# Patient Record
Sex: Male | Born: 1935 | Race: Black or African American | Hispanic: No | Smoking: Former smoker
Health system: Southern US, Community
[De-identification: ages and names within clinical notes are randomized; demographics above are authoritative.]

## PROBLEM LIST (undated history)

## (undated) DIAGNOSIS — E119 Type 2 diabetes mellitus without complications: Secondary | ICD-10-CM

## (undated) DIAGNOSIS — C801 Malignant (primary) neoplasm, unspecified: Secondary | ICD-10-CM

## (undated) HISTORY — DX: Type 2 diabetes mellitus without complications: E11.9

## (undated) HISTORY — DX: Malignant (primary) neoplasm, unspecified: C80.1

## (undated) HISTORY — PX: COLON SURGERY: SHX602

---

## 2001-09-03 ENCOUNTER — Encounter: Admission: RE | Admit: 2001-09-03 | Discharge: 2001-09-03 | Payer: Self-pay | Admitting: Otolaryngology

## 2001-09-03 ENCOUNTER — Encounter: Payer: Self-pay | Admitting: Otolaryngology

## 2002-04-25 ENCOUNTER — Encounter: Admission: RE | Admit: 2002-04-25 | Discharge: 2002-04-25 | Payer: Self-pay | Admitting: Orthopedic Surgery

## 2002-04-25 ENCOUNTER — Encounter: Payer: Self-pay | Admitting: Orthopedic Surgery

## 2002-08-02 ENCOUNTER — Encounter (INDEPENDENT_AMBULATORY_CARE_PROVIDER_SITE_OTHER): Payer: Self-pay

## 2002-08-02 ENCOUNTER — Ambulatory Visit (HOSPITAL_COMMUNITY): Admission: RE | Admit: 2002-08-02 | Discharge: 2002-08-02 | Payer: Self-pay | Admitting: Gastroenterology

## 2002-08-09 ENCOUNTER — Ambulatory Visit (HOSPITAL_COMMUNITY): Admission: RE | Admit: 2002-08-09 | Discharge: 2002-08-09 | Payer: Self-pay | Admitting: Gastroenterology

## 2002-08-09 ENCOUNTER — Encounter (INDEPENDENT_AMBULATORY_CARE_PROVIDER_SITE_OTHER): Payer: Self-pay

## 2002-10-27 ENCOUNTER — Encounter (INDEPENDENT_AMBULATORY_CARE_PROVIDER_SITE_OTHER): Payer: Self-pay | Admitting: Specialist

## 2002-10-27 ENCOUNTER — Ambulatory Visit (HOSPITAL_COMMUNITY): Admission: RE | Admit: 2002-10-27 | Discharge: 2002-10-27 | Payer: Self-pay | Admitting: Gastroenterology

## 2002-12-07 ENCOUNTER — Encounter: Payer: Self-pay | Admitting: Gastroenterology

## 2002-12-07 ENCOUNTER — Encounter: Admission: RE | Admit: 2002-12-07 | Discharge: 2002-12-07 | Payer: Self-pay | Admitting: Gastroenterology

## 2003-02-10 ENCOUNTER — Encounter: Payer: Self-pay | Admitting: Surgery

## 2003-02-14 ENCOUNTER — Ambulatory Visit (HOSPITAL_COMMUNITY): Admission: RE | Admit: 2003-02-14 | Discharge: 2003-02-14 | Payer: Self-pay | Admitting: Gastroenterology

## 2003-02-15 ENCOUNTER — Inpatient Hospital Stay (HOSPITAL_COMMUNITY): Admission: RE | Admit: 2003-02-15 | Discharge: 2003-02-20 | Payer: Self-pay | Admitting: Surgery

## 2003-02-15 ENCOUNTER — Encounter (INDEPENDENT_AMBULATORY_CARE_PROVIDER_SITE_OTHER): Payer: Self-pay

## 2004-05-23 ENCOUNTER — Encounter (INDEPENDENT_AMBULATORY_CARE_PROVIDER_SITE_OTHER): Payer: Self-pay | Admitting: Specialist

## 2004-05-23 ENCOUNTER — Ambulatory Visit (HOSPITAL_COMMUNITY): Admission: RE | Admit: 2004-05-23 | Discharge: 2004-05-23 | Payer: Self-pay | Admitting: Gastroenterology

## 2007-05-25 ENCOUNTER — Encounter: Admission: RE | Admit: 2007-05-25 | Discharge: 2007-05-25 | Payer: Self-pay | Admitting: Internal Medicine

## 2008-06-06 ENCOUNTER — Encounter: Admission: RE | Admit: 2008-06-06 | Discharge: 2008-06-06 | Payer: Self-pay | Admitting: Orthopedic Surgery

## 2010-12-16 ENCOUNTER — Encounter: Payer: Self-pay | Admitting: Podiatry

## 2011-04-12 NOTE — Op Note (Signed)
NAME:  Keith Pittman, Keith Pittman                         ACCOUNT NO.:  [id number]   MEDICAL RECORD NO.:  [id number]                   PATIENT TYPE:  AMB   LOCATION:  ENDO                                 FACILITY:  [facility]   PHYSICIAN:  Danise Edge, M.D.                DATE OF BIRTH:  1932-08-12   DATE OF PROCEDURE:  05/23/2004  DATE OF DISCHARGE:                                 OPERATIVE REPORT   PROCEDURE:  Colonoscopy with polypectomy.   PROCEDURE INDICATION:  Keith Pittman is a 77 year old male, born 01/08/1932.  Approximately 1 year ago, he underwent a laparoscopically-  assisted right hemicolectomy to remove a malignant polyp from the right  colon.  Surveillance colonoscopy with polypectomy to prevent recurrent colon  cancer is scheduled today.   ENDOSCOPIST:  Charolett Bumpers, M.D.   PREMEDICATION:  1. Versed 5 mg.  2. Demerol 50 mg.   DESCRIPTION OF PROCEDURE:  After obtaining informed consent, Mr. Needle was  placed in the left lateral decubitus position.  I administered intravenous  Demerol and intravenous Versed to achieve conscious sedation for the  procedure.  The patient's blood pressure, oxygen saturation, and cardiac  rhythm were monitored throughout the procedure and documented in the medical  record.   Anal inspection and digital rectal exam were normal.  The Olympus adjustable  pediatric colonoscope was introduced into the rectum and advanced to the  ileo right colonic surgical anastomosis.  Colonic preparation for the exam  today was excellent.   RECTUM:  Normal.  SIGMOID COLON AND DESCENDING COLON:  Left colonic diverticulosis.  SPLENIC FLEXURE:  Normal.  TRANSVERSE COLON:  From the proximal transverse colon, a 1 mm sessile polyp  was removed with the cold biopsy forceps.  HEPATIC FLEXURE:  Normal.  ASCENDING COLON:  Normal.  ILEO RIGHT COLONIC SURGICAL ANASTOMOSIS:  Normal.   ASSESSMENT:  A diminutive polyp was removed from the proximal  transverse  colon.  Left colonic diverticulosis is present.  Otherwise, normal  proctocolonoscopy to the ileal right colonic surgical anastomosis.   RECOMMENDATIONS:  Repeat colonoscopy in 5 years.                                               Danise Edge, M.D.    MJ/MEDQ  D:  05/23/2004  T:  05/23/2004  Job:  [id number]   cc:   Thornton Park Daphine Deutscher, M.D.  [address], [address]  [place]  [place] [address]  Fax: [phone]   Thora Lance, M.D.  [address]  [place]  [place] [address]  Fax: [phone]

## 2011-04-12 NOTE — Discharge Summary (Signed)
   NAME:  Keith Pittman, Keith Pittman                         ACCOUNT NO.:  [id number]   MEDICAL RECORD NO.:  [id number]                   PATIENT TYPE:  AMB   LOCATION:  ENDO                                 FACILITY:  [facility]   PHYSICIAN:  Thornton Park. Daphine Deutscher, M.D.             DATE OF BIRTH:  Apr 21, 1932   DATE OF ADMISSION:  02/14/2003  DATE OF DISCHARGE:  02/14/2003                                 DISCHARGE SUMMARY   ADMISSION DIAGNOSIS:  History of adenocarcinoma of the ascending colon,  status post colonoscopy with tattooing of lesion.   PROCEDURE:  Laparoscopically assisted right hemicolectomy.   HOSPITAL COURSE:  The patient had a bowel prep and came into the hospital to  undergo the above mentioned operation.  He underwent a laparoscopically  assisted right hemicolectomy.  The tattooed lesion was seen by the  pathologist and no residual carcinoma was found.  In the meantime, the  patient did well.  He began passing bowel movements and was ready for  discharge to return to the office in approximately a week.   DIET:  Advance as tolerated.   DISCHARGE MEDICATIONS:  Vicodin to take for pain.   CONDITION ON DISCHARGE:  Good.   FINAL DIAGNOSES:  1. History of adenocarcinoma of the right colon.  2. Status post laparoscopically assisted right hemicolectomy.                                               Thornton Park Daphine Deutscher, M.D.    MBM/MEDQ  D:  03/14/2003  T:  03/14/2003  Job:  [id number]   cc:   Danise Edge, M.D.  [address]  [place]  [place] [address]  Fax: [phone]

## 2011-04-12 NOTE — Op Note (Signed)
NAME:  Keith Pittman, GRANDT                         ACCOUNT NO.:  [id number]   MEDICAL RECORD NO.:  [id number]                   PATIENT TYPE:  AMB   LOCATION:  ENDO                                 FACILITY:  [facility]   PHYSICIAN:  Thornton Park. Daphine Deutscher, M.D.             DATE OF BIRTH:  [birth year]   DATE OF PROCEDURE:  02/15/2003  DATE OF DISCHARGE:                                 OPERATIVE REPORT   CCS#:  [id number]   PREOPERATIVE DIAGNOSES:  Carcinoma and polyp from right colon.   POSTOPERATIVE DIAGNOSES:  Carcinoma and polyp from right colon.   PROCEDURE:  Laparoscopically assisted right hemicolectomy.   SURGEON:  Thornton Park. Daphine Deutscher, M.D.   ASSISTANT:  Adolph Pollack, M.D.   OPERATIVE TIME:  2 hours.   ANESTHESIA:  General endotracheal.   DESCRIPTION OF PROCEDURE:  Dorrell Mitcheltree was a 77 year old gentleman taken  to room 11 at [facility], given general anesthesia. The abdomen was prepped  with Betadine and draped sterilely. A transverse incision was made just  above the umbilicus through which I have inserted a Hasson cannula. The  abdomen was insufflated and a 5 mm was placed above and below the abdomen,  also in line in the right side. A 30 degree scope was used. The cecum and  appendix were mobilized using the harmonic scalpel. This was carried up  along the right gutter to the hepatic flexure where I mobilized most all of  the hepatic flexure and then came down and divided the transverse colon from  the omentum and mobilized that as well.   Next, we inserted the hand retractor and this kind of walled off the wound.  This was after I made a small transverse incision. I did not completely  divide the rectus abdominis muscle and this assisted in the retraction of  the structures. The colon could all but come out of this hole. I did have to  mobilize a little bit more of the hepatic flexure using the Bovie and the  harmonic scalpel. I then divided the bowel terminal ileum. I found  the  tattooed area was sort of in the lateral extraperitoneal area of the  proximal ascending colon. The bowel was divided in the transverse colon. The  mesentery was divided with Kelly clamps and the harmonic scalpel and the  mesentery was oversewn with 2-0 silk suture ligatures.   The bowel ends were then aligned and a functional end-to-end anastomosis was  created with the GIA. Prior to doing that, I did close the mesentery with  interrupted 2-0 silks. Good staple anastomosis was done and the common  defect was closed with application of the TA-55. No bleeding was noted and  was checked. A crotch suture was placed. The bowel was then returned to the  abdomen. We changed our gloves. We removed the wound retractor. We irrigated  the abdomen with saline.   The posterior sheath  was then closed with running #1 PDS. The anterior  sheath was closed with running #1 PDS. The wound was injected with 0.5%  Marcaine. The wound was irrigated and then closed with staples. The patient  tolerated the procedure well and was taken to the recovery room in  satisfactory condition.   FINAL DIAGNOSIS:  Prior malignant polyp removed from the right colon. Status  post right hemicolectomy lap assisted.                                               Thornton Park Daphine Deutscher, M.D.    MBM/MEDQ  D:  02/15/2003  T:  02/15/2003  Job:  [id number]   cc:   Thora Lance, M.D.  [address] [place]  [place] [address]  Fax: [phone]   Danise Edge, M.D.  [address]  [place]  [place] [address]  Fax: [phone]

## 2011-04-12 NOTE — Op Note (Signed)
NAME:  Keith Pittman, Keith Pittman                         ACCOUNT NO.:  [id number]   MEDICAL RECORD NO.:  [id number]                   PATIENT TYPE:  AMB   LOCATION:  ENDO                                 [facility]ACILITY:  [facility]   PHYSICIAN:  Danise Edge, M.D.                DATE OF BIRTH:  05/03/1932   DATE OF PROCEDURE:  10/27/2002  DATE OF DISCHARGE:                                 OPERATIVE REPORT   PROCEDURE:  .  Colonoscopy and polypectomy.   INDICATIONS FOR PROCEDURE:  Keith Pittman is a 77 year old male born  07-19-1932. Keith Pittman underwent his first screening colonoscopy at  [facility] August 02, 2002. A 5 mm mid rectal sessile polyp was  lifted by submucosal saline injection and removed with the electrocautery  snare. The rectal polyp returned moderately differentiated colonic  adenocarcinoma associated with a tubular adenoma. From the proximal  ascending colon, an 8 mm sessile polyp was lifted with submucosal saline  injection and removed with the  electrocautery snare in piecemeal fashion.  Pathology returned colonic type adenocarcinoma associated with a  hyperplastic polyp.   On August 09, 2002, a repeat colonoscopy was performed. Multiple biopsies  taken from the mid rectal polypectomy site returned no residual neoplastic  tissue. Multiple biopsies taken from the proximal ascending colon  polypectomy site returned nonneoplastic tissue.   Keith Pittman is reluctant to undergo surgery unless absolutely necessary. I  think the rectal polyp has been completely removed; my concern is the  proximal ascending colon polyp which was larger and could easily have been  only partially removed. Repeat colonoscopy is scheduled today to assess both  polypectomy sites.   ENDOSCOPIST:  Charolett Bumpers, M.D.   PREMEDICATION:  Versed 10 mg, Demerol 50 mg .   ENDOSCOPE:  Olympus adult colonoscope.   DESCRIPTION OF PROCEDURE:  After obtaining informed consent, Mr.  Pittman was  placed in the left lateral decubitus position. I administered intravenous  Demerol and intravenous Versed to achieve conscious sedation for the  procedure. The patient's blood pressure, oxygen saturation and cardiac  rhythm were monitored throughout the procedure and documented in the medical  record.   Anal inspection was normal. Digital rectal exam was normal. The Olympus  pediatric video colonoscope was introduced into the rectum and advanced to  the cecum. Colonic preparation for the exam today was excellent.   RECTUM:  Close examination of the rectum was completely normal. I can not  even identify where the mid rectal polypectomy site was and there does not  appear to be any residual neoplastic tissue.   SIGMOID COLON AND DESCENDING COLON:  Left colonic diverticulosis.   SPLENIC FLEXURE:  Normal.   TRANSVERSE COLON:  Normal.   HEPATIC FLEXURE:  Normal.   ASCENDING COLON:  The proximal ascending colon polypectomy site was  identified. There is a residual 2 mm sessile polyp which was removed  with  the cold snare. The polypectomy site was lifted with submucosal saline  injection and two snare biopsies were obtained. Multiple cold biopsies were  taken at the ascending colon polypectomy site.   CECUM AND ILEOCECAL VALVE:  Normal.   ASSESSMENT:  1. Left colonic diverticulosis.  2. Malignant polyp in the mid rectum completely removed colonoscopically.  3. Malignant proximal ascending colon polyp appears to demonstrate residual     tissue post polypectomy. Multiple biopsies are pending from this site. I     am going to recommend to Keith Pittman that he seriously consider segmental     right colon resection. I will go over the biopsy findings with him.                                               Danise Edge, M.D.    MJ/MEDQ  D:  10/27/2002  T:  10/27/2002  Job:  [id number]   cc:   Keith Pittman, M.D.  [address]  [place]  [place] [address]  Fax:  [phone]

## 2011-04-12 NOTE — Op Note (Signed)
   TNAMEKOUROSH, JABLONSKY                        ACCOUNT NO.:  [id number]   MEDICAL RECORD NO.:  [id number]                   PATIENT TYPE:  AMB   LOCATION:  ENDO                                 FACILITY:  [facility]   PHYSICIAN:  Charolett Bumpers, M.D.             DATE OF BIRTH:  [birth year]   DATE OF PROCEDURE:  08/02/2002  DATE OF DISCHARGE:                                 OPERATIVE REPORT   PROCEDURE:  Screening colonoscopy with polypectomy.   PROCEDURE INDICATION:  The patient is a 77 year old male, born July 05, 1932.  The patient is scheduled to undergo his first screening colonoscopy  with polypectomy to prevent colon cancer.   ENDOSCOPIST:  Charolett Bumpers, M.D.   PREMEDICATION:  Versed 5 mg, Demerol 50 mg.   ENDOSCOPE:  Olympus pediatric colonoscope.   DESCRIPTION OF PROCEDURE:  After obtaining informed consent, the patient was  placed in the left lateral decubitus position.  I administered intravenous  Demerol and intravenous Versed to achieve conscious sedation for the  procedure.  The patient's blood pressure, oxygen saturation, and cardiac  rhythm were monitored throughout the procedure and documented in the medical  record.   Anal inspection was normal.  Digital rectal exam was normal.  The Olympus  pediatric video colonoscope was introduced into the rectum and easily  advanced to the cecum.  Colonic preparation for the exam today was  excellent.   RECTUM:  From the mid rectum, a 5 mm sessile polyp was lifted by saline  injection and removed with the electrocautery snare.  SIGMOID COLON AND DESCENDING COLON:  Normal.  SPLENIC FLEXURE:  Normal.  TRANSVERSE COLON:  Normal.  HEPATIC FLEXURE:  Normal.  ASCENDING COLON:  From the proximal ascending colon, an 8 mm sessile polyp  was lifted with saline injection and removed with the electrocautery snare  in piecemeal fashion.  CECUM AND ILEOCECAL VALVE:  Normal.    ASSESSMENT:  1. From the proximal ascending  colon, an 8 mm sessile polyp was removed.  2. From the mid rectum, a 5 mm sessile polyp was removed.   RECOMMENDATIONS:  Repeat colonoscopy in approximately five years.                                               Charolett Bumpers, M.D.    MKJ/MEDQ  D:  08/02/2002  T:  08/02/2002  Job:  [id number]   cc:   Thora Lance, M.D.

## 2011-04-12 NOTE — Op Note (Signed)
   NAME:  Keith Pittman, Keith Pittman                         ACCOUNT NO.:  [id number]   MEDICAL RECORD NO.:  [id number]                   PATIENT TYPE:  AMB   LOCATION:  ENDO                                 FACILITY:  [facility]   PHYSICIAN:  Danise Edge, M.D.                DATE OF BIRTH:  1932/03/17   DATE OF PROCEDURE:  02/14/2003  DATE OF DISCHARGE:                                 OPERATIVE REPORT   PROCEDURE:  Colonoscopy.   INDICATIONS FOR PROCEDURE:  Mr. [name] is a 77 year old male born  Apr 23, 1932. Mr. [name] is scheduled to undergo a right hemicolectomy  to ensure complete removal of a malignant polyp removed colonoscopically  from the proximal ascending colon. Pathology returned hyperplastic polyp  containing adenocarcinoma.   ENDOSCOPIST:  Charolett Bumpers, M.D.   PREMEDICATION:  Versed 7.5 mg, Demerol 50 mg .   ENDOSCOPE:  Olympus adult colonoscope.   DESCRIPTION OF PROCEDURE:  After obtaining informed consent, Mr. [name] was  placed in the left lateral decubitus position. I administered intravenous  Demerol and intravenous Versed to achieve conscious sedation for the  procedure. The patient's blood pressure, oxygen saturation and cardiac  rhythm were monitored throughout the procedure and documented in the medical  record.   Anal inspection was normal. Digital rectal exam was normal. The Olympus  colonoscope was introduced into the rectum and easily advanced to the cecum.  Colonic preparation for the exam today was excellent.   RECTUM:  Normal.   SIGMOID COLON AND DESCENDING COLON:  Left colonic diverticulosis.   SPLENIC FLEXURE:  Normal.   TRANSVERSE COLON:  Normal.   HEPATIC FLEXURE:  Normal.   ASCENDING COLON:  I identified the malignant polypectomy site in the  proximal ascending colon. The site was tattooed with [place] ink.   CECUM AND ILEOCECAL VALVE:  Normal.    ASSESSMENT:  1. Left colonic diverticulosis.  2. Proximal ascending colon  malignant polypectomy site tattooed with [place]     ink.                                               Danise Edge, M.D.    MJ/MEDQ  D:  02/14/2003  T:  02/14/2003  Job:  [id number]   cc:   Thorn[place] Park Daphine Deutscher, M.D.  [address], [address]  [place]  [place] [address]  Fax: [phone]   Thora Lance, M.D.  [address]  [place]  [place] [address]  Fax: [phone]

## 2011-04-12 NOTE — Op Note (Signed)
NAME:  AYDRIAN, HALPIN                         ACCOUNT NO.:  [id number]   MEDICAL RECORD NO.:  [id number]                   PATIENT TYPE:  AMB   LOCATION:  ENDO                                 FACILITY[facility]   PHYSICIAN:  Danise Edge, MD                  DATE OF BIRTH:  07/28/1932   DATE OF PROCEDURE:  08/09/2002  DATE OF DISCHARGE:                                 OPERATIVE REPORT   PROCEDURE:  Colonoscopy.   INDICATIONS:  The patient is a 77 year old male born Aug 21, 2032.  Approximately  one week ago he underwent his first screening colonoscopy; from the proximal  ascending colon a hyperplastic polyp harboring adenocarcinoma was removed.  From the distal rectum an adenomatous polyp harboring adenocarcinoma was  removed.  Repeat colonoscopy is scheduled today to evaluate the malignant  polypectomy sites and perform biopsies to determine if the patient will  require surgery.   ENDOSCOPIST:  Danise Edge, M.D.   PREMEDICATION:  Versed 10 mg, Demerol 50 mg.   ENDOSCOPE:  Olympus pediatric colonoscope.   DESCRIPTION OF PROCEDURE:  After obtaining informed consent, the patient was  placed in the left lateral decubitus position.  I administered intravenous  Demerol and intravenous Versed to achieve conscious sedation for the  procedure.  The patient's blood pressure, oxygen saturation, and cardiac  rhythm were monitored throughout the procedure and documented in the medical  record.   Anal inspection was normal.  Digital rectal exam revealed an enlarged but  non-nodular prostate.  The Olympus pediatric video colonoscope was  introduced into the rectum and easily advanced to the cecum.  Colonic  preparation for the exam today was excellent.   Rectum:  From the distal rectum at approximately 10 cm from the anal verge,  the rectal polypectomy site was easily identified by the presence of  edematous, ulcerated mucosa.  I attempted to lift the polypectomy site with  saline, but  the polypectomy site lifted poorly.  Both cold snare and cold  biopsies were performed from the site.  The site was then coagulated with  the argon plasma coagulator.   Sigmoid colon and descending colon:  Left colonic diverticulosis.   Splenic flexure normal.   Transverse colon normal.   Hepatic flexure normal.   Ascending colon:  The proximal ascending colonic polypectomy site was easily  identified by the presence of edematous, ulcerated mucosa.  Multiple  biopsies were performed.  This site lifted poorly with saline, so I was  unable to do a snare specimen.   Cecum and ileocecal valve normal.   ASSESSMENT:  1. Left colonic diverticulosis.  2. Malignant polypectomy site re-biopsied from the proximal ascending colon.  3. Malignant polypectomy site was biopsied from the distal rectum.  Danise Edge, MD    MJ/MEDQ  D:  08/09/2002  T:  08/09/2002  Job:  [id number]

## 2012-06-08 ENCOUNTER — Other Ambulatory Visit: Payer: Self-pay | Admitting: Dermatology

## 2013-09-16 ENCOUNTER — Encounter: Payer: Medicare Other | Attending: Internal Medicine | Admitting: *Deleted

## 2013-09-16 VITALS — BP 0/0 | Ht 69.0 in | Wt 230.6 lb

## 2013-09-16 DIAGNOSIS — Z713 Dietary counseling and surveillance: Secondary | ICD-10-CM | POA: Insufficient documentation

## 2013-09-16 DIAGNOSIS — E119 Type 2 diabetes mellitus without complications: Secondary | ICD-10-CM | POA: Insufficient documentation

## 2013-09-17 NOTE — Progress Notes (Signed)
Patient was seen on 09/16/13 for the first of a series of three diabetes self-management courses at the [facility].  Current HbA1c: 6.5%  The following learning objectives were met by the patient during this class:  Describe diabetes  State some common risk factors for diabetes  Defines the role of glucose and insulin  Identifies type of diabetes and pathophysiology  Describe the relationship between diabetes and cardiovascular risk  State the members of the Healthcare Team  States the rationale for glucose monitoring  State when to test glucose  State their individual Target Range  State the importance of logging glucose readings  Describe how to interpret glucose readings  Identifies A1C target  Explain the correlation between A1c and eAG values  State symptoms and treatment of high blood glucose  State symptoms and treatment of low blood glucose  Explain proper technique for glucose testing  Identifies proper sharps disposal  Handouts given during class include:  Living Well with Diabetes book  Carb Counting and Meal Planning book  Meal Plan Card  Carbohydrate guide  Meal planning worksheet  Low Sodium Flavoring Tips  The diabetes portion plate  Low Carbohydrate Snack Suggestions  A1c to eAG Conversion Chart  Diabetes Medications  Stress Management  Diabetes Recommended Care Schedule  Diabetes Success Plan  Core Class Satisfaction Survey  Your patient has identified their diabetes care support plan as:  [facility]  Staff  PCP  Follow-Up Plan:  Attend core 2

## 2013-09-23 DIAGNOSIS — E119 Type 2 diabetes mellitus without complications: Secondary | ICD-10-CM

## 2013-09-23 NOTE — Progress Notes (Signed)
Patient was seen on 09/23/13 for the second of a series of three diabetes self-management courses at the [facility]. The following learning objectives were met by the patient during this class:   Describe the role of different macronutrients on glucose  Explain how carbohydrates affect blood glucose  State what foods contain the most carbohydrates  Demonstrate carbohydrate counting  Demonstrate how to read Nutrition Facts food label  Describe effects of various fats on heart health  Describe the importance of good nutrition for health and healthy eating strategies  Describe techniques for managing your shopping, cooking and meal planning  List strategies to follow meal plan when dining out  Describe the effects of alcohol on glucose and how to use it safely  Follow-Up Plan:  Attend Core 3  Work towards following your personal food plan.   

## 2015-04-27 ENCOUNTER — Emergency Department (HOSPITAL_COMMUNITY)
Admission: EM | Admit: 2015-04-27 | Discharge: 2015-04-27 | Disposition: A | Discharge status: Home / Self Care | Payer: Medicare Other | Attending: Emergency Medicine | Admitting: Emergency Medicine

## 2015-04-27 ENCOUNTER — Encounter (HOSPITAL_COMMUNITY): Payer: Self-pay | Admitting: Emergency Medicine

## 2015-04-27 DIAGNOSIS — Z79899 Other long term (current) drug therapy: Secondary | ICD-10-CM | POA: Diagnosis not present

## 2015-04-27 DIAGNOSIS — E119 Type 2 diabetes mellitus without complications: Secondary | ICD-10-CM | POA: Diagnosis not present

## 2015-04-27 DIAGNOSIS — Z85038 Personal history of other malignant neoplasm of large intestine: Secondary | ICD-10-CM | POA: Diagnosis not present

## 2015-04-27 DIAGNOSIS — R55 Syncope and collapse: Secondary | ICD-10-CM | POA: Diagnosis present

## 2015-04-27 DIAGNOSIS — Z87891 Personal history of nicotine dependence: Secondary | ICD-10-CM | POA: Insufficient documentation

## 2015-04-27 LAB — CBC WITH DIFFERENTIAL/PLATELET
BASOS ABS: 0 10*3/uL (ref 0.0–0.1)
BASOS PCT: 0 % (ref 0–1)
Eosinophils Absolute: 0.1 10*3/uL (ref 0.0–0.7)
Eosinophils Relative: 2 % (ref 0–5)
HCT: 43.5 % (ref 39.0–52.0)
HEMOGLOBIN: 14.7 g/dL (ref 13.0–17.0)
LYMPHS PCT: 13 % (ref 12–46)
Lymphs Abs: 1 10*3/uL (ref 0.7–4.0)
MCH: 30.9 pg (ref 26.0–34.0)
MCHC: 33.8 g/dL (ref 30.0–36.0)
MCV: 91.4 fL (ref 78.0–100.0)
MONO ABS: 0.5 10*3/uL (ref 0.1–1.0)
Monocytes Relative: 7 % (ref 3–12)
Neutro Abs: 6 10*3/uL (ref 1.7–7.7)
Neutrophils Relative %: 78 % — ABNORMAL HIGH (ref 43–77)
Platelets: 213 10*3/uL (ref 150–400)
RBC: 4.76 MIL/uL (ref 4.22–5.81)
RDW: 13.3 % (ref 11.5–15.5)
WBC: 7.7 10*3/uL (ref 4.0–10.5)

## 2015-04-27 LAB — BASIC METABOLIC PANEL
ANION GAP: 7 (ref 5–15)
BUN: 18 mg/dL (ref 6–20)
CALCIUM: 10.5 mg/dL — AB (ref 8.9–10.3)
CO2: 26 mmol/L (ref 22–32)
CREATININE: 1.19 mg/dL (ref 0.61–1.24)
Chloride: 110 mmol/L (ref 101–111)
GFR calc non Af Amer: 56 mL/min — ABNORMAL LOW (ref >60)
GLUCOSE: 168 mg/dL — AB (ref 65–99)
Potassium: 4.3 mmol/L (ref 3.5–5.1)
Sodium: 143 mmol/L (ref 135–145)

## 2015-04-27 NOTE — ED Notes (Signed)
MD at bedside. 

## 2015-04-27 NOTE — Discharge Instructions (Signed)
Please call your doctor for a followup appointment within 24-48 hours. When you talk to your doctor please let them know that you were seen in the emergency department and have them acquire all of your records so that they can discuss the findings with you and formulate a treatment plan to fully care for your new and ongoing problems. ° °

## 2015-04-27 NOTE — ED Provider Notes (Signed)
CSN: [id number]     Arrival date & time 04/27/15  1908 History   First MD Initiated Contact with Patient 04/27/15 1911     Chief Complaint  Patient presents with  . Near Syncope     (Consider location/radiation/quality/duration/timing/severity/associated sxs/prior Treatment) HPI Comments: Pt is an elderly male who has no PMH - takes no medicines - states he was fishing - he sat on the truck bed and family member states that he went unresponsive - the pt denies this - EMS reports that he had A and O without other findings - VS normal, CBG 148, saline given, pt denies any c/o at all.  Patient is a 81 y.o. male presenting with near-syncope. The history is provided by the patient.  Near Syncope    Past Medical History  Diagnosis Date  . Diabetes mellitus without complication   . Cancer     colon   Past Surgical History  Procedure Laterality Date  . Colon surgery     History reviewed. No pertinent family history. History  Substance Use Topics  . Smoking status: Former Research scientist (life sciences)  . Smokeless tobacco: Not on file  . Alcohol Use: Yes    Review of Systems  Cardiovascular: Positive for near-syncope.  All other systems reviewed and are negative.     Allergies  Sulfa antibiotics  Home Medications   Prior to Admission medications   Medication Sig Start Date End Date Taking? Authorizing Provider  ibuprofen (ADVIL,MOTRIN) 200 MG tablet Take 400 mg by mouth every 6 (six) hours as needed for headache (headache).   Yes Historical Provider, MD  levothyroxine (SYNTHROID, LEVOTHROID) 25 MCG tablet Take 25 mcg by mouth daily before breakfast.  04/19/15  Yes Historical Provider, MD   BP 136/75 mmHg  Pulse 82  Temp(Src) 98.6 F (37 C) (Oral)  Resp 18  SpO2 97% Physical Exam  Constitutional: He appears well-developed and well-nourished. No distress.  HENT:  Head: Normocephalic and atraumatic.  Mouth/Throat: Oropharynx is clear and moist. No oropharyngeal exudate.  Eyes: Conjunctivae  and EOM are normal. Pupils are equal, round, and reactive to light. Right eye exhibits no discharge. Left eye exhibits no discharge. No scleral icterus.  Neck: Normal range of motion. Neck supple. No JVD present. No thyromegaly present.  Cardiovascular: Normal rate, regular rhythm, normal heart sounds and intact distal pulses.  Exam reveals no gallop and no friction rub.   No murmur heard. Pulmonary/Chest: Effort normal and breath sounds normal. No respiratory distress. He has no wheezes. He has no rales.  Abdominal: Soft. Bowel sounds are normal. He exhibits no distension and no mass. There is no tenderness.  Musculoskeletal: Normal range of motion. He exhibits no edema or tenderness.  Lymphadenopathy:    He has no cervical adenopathy.  Neurological: He is alert. Coordination normal.  Neurologic exam:  Speech clear, pupils equal round reactive to light, extraocular movements intact  Normal peripheral visual fields Cranial nerves III through XII normal including no facial droop Follows commands, moves all extremities x4, normal strength to bilateral upper and lower extremities at all major muscle groups including grip Sensation normal to light touch and pinprick Coordination intact, no limb ataxia, finger-nose-finger normal Rapid alternating movements normal No pronator drift Gait normal   Skin: Skin is warm and dry. No rash noted. No erythema.  Psychiatric: He has a normal mood and affect. His behavior is normal.  Nursing note and vitals reviewed.   ED Course  Procedures (including critical care time) Labs Review Labs Reviewed  BASIC METABOLIC PANEL - Abnormal; Notable for the following:    Glucose, Bld 168 (*)    Calcium 10.5 (*)    GFR calc non Af Amer 56 (*)    All other components within normal limits  CBC WITH DIFFERENTIAL/PLATELET - Abnormal; Notable for the following:    Neutrophils Relative % 78 (*)    All other components within normal limits    Imaging Review No  results found.   EKG Interpretation   Date/Time:  Thursday April 27 2015 19:26:02 EDT Ventricular Rate:  94 PR Interval:  168 QRS Duration: 95 QT Interval:  342 QTC Calculation: 428 R Axis:   75 Text Interpretation:  Sinus rhythm Ventricular premature complex Low  voltage, precordial leads Borderline T abnormalities, diffuse leads since  last tracing no significant change Confirmed by Ceonna Frazzini  MD, Inara Dike ([id number])  on 04/27/2015 10:02:10 PM      MDM   Final diagnoses:  Near syncope    Well appearing, VS normal - ECG and labs - no c/o.  Pt reexamined at 11:00 - will asymptomatic, no findigns on cardiac monitoring, normal ECG and labs - VS normal - pt still without c/o   Given instructions for return, precautions, pt expressed understanding,     Noemi Chapel, MD 04/27/15 2312

## 2015-04-27 NOTE — ED Notes (Addendum)
Per EMS- pt was fishing and was outside for approximately an hour. Was walking back to the truck to sit down. Sat down on tailgate and family member reports he went unresponsive. However, reported the patient didn't fall. Family member was pushing on his chest and that he couldn't get him to talk. Patient denies syncopal episode. Upon arrival, patient was clammy but A&Ox4. Ambulatory with no complaints. VS: BP 140/90, 129/82 HR 92 NSR with PVCs RR 14 SpO2 99% on RA. CBG 148 mg/dl. 18 PIV in right hand with approximately 400 cc NS given prior to arrival. EMS also reports repetitive words and statements on the way here. However, family reports this is baseline for patient.

## 2015-04-27 NOTE — ED Notes (Signed)
Bed: KD32 Expected date:  Expected time:  Means of arrival:  Comments: EMS- 81yo F, near syncope

## 2016-12-24 DIAGNOSIS — E039 Hypothyroidism, unspecified: Secondary | ICD-10-CM | POA: Diagnosis not present

## 2016-12-24 DIAGNOSIS — Z1389 Encounter for screening for other disorder: Secondary | ICD-10-CM | POA: Diagnosis not present

## 2016-12-24 DIAGNOSIS — Z0000 Encounter for general adult medical examination without abnormal findings: Secondary | ICD-10-CM | POA: Diagnosis not present

## 2016-12-24 DIAGNOSIS — E1142 Type 2 diabetes mellitus with diabetic polyneuropathy: Secondary | ICD-10-CM | POA: Diagnosis not present

## 2016-12-24 DIAGNOSIS — Z23 Encounter for immunization: Secondary | ICD-10-CM | POA: Diagnosis not present

## 2016-12-24 DIAGNOSIS — Z85038 Personal history of other malignant neoplasm of large intestine: Secondary | ICD-10-CM | POA: Diagnosis not present

## 2019-02-01 ENCOUNTER — Inpatient Hospital Stay (HOSPITAL_COMMUNITY)
Admission: EM | Admit: 2019-02-01 | Discharge: 2019-02-05 | DRG: 083 | Disposition: A | Discharge status: Skilled Nursing Facility | Payer: Medicare Other | Attending: Internal Medicine | Admitting: Internal Medicine

## 2019-02-01 ENCOUNTER — Encounter (HOSPITAL_COMMUNITY): Payer: Self-pay

## 2019-02-01 ENCOUNTER — Other Ambulatory Visit: Payer: Self-pay

## 2019-02-01 ENCOUNTER — Emergency Department (HOSPITAL_COMMUNITY): Payer: Medicare Other

## 2019-02-01 DIAGNOSIS — R945 Abnormal results of liver function studies: Secondary | ICD-10-CM

## 2019-02-01 DIAGNOSIS — Z9181 History of falling: Secondary | ICD-10-CM

## 2019-02-01 DIAGNOSIS — I493 Ventricular premature depolarization: Secondary | ICD-10-CM | POA: Diagnosis not present

## 2019-02-01 DIAGNOSIS — R531 Weakness: Secondary | ICD-10-CM | POA: Diagnosis not present

## 2019-02-01 DIAGNOSIS — W1830XA Fall on same level, unspecified, initial encounter: Secondary | ICD-10-CM | POA: Diagnosis present

## 2019-02-01 DIAGNOSIS — E119 Type 2 diabetes mellitus without complications: Secondary | ICD-10-CM | POA: Diagnosis not present

## 2019-02-01 DIAGNOSIS — S199XXA Unspecified injury of neck, initial encounter: Secondary | ICD-10-CM | POA: Diagnosis not present

## 2019-02-01 DIAGNOSIS — S065XAA Traumatic subdural hemorrhage with loss of consciousness status unknown, initial encounter: Secondary | ICD-10-CM | POA: Diagnosis present

## 2019-02-01 DIAGNOSIS — Y92003 Bedroom of unspecified non-institutional (private) residence as the place of occurrence of the external cause: Secondary | ICD-10-CM

## 2019-02-01 DIAGNOSIS — S065X0A Traumatic subdural hemorrhage without loss of consciousness, initial encounter: Secondary | ICD-10-CM | POA: Diagnosis not present

## 2019-02-01 DIAGNOSIS — F0390 Unspecified dementia without behavioral disturbance: Secondary | ICD-10-CM | POA: Diagnosis not present

## 2019-02-01 DIAGNOSIS — F05 Delirium due to known physiological condition: Secondary | ICD-10-CM | POA: Diagnosis not present

## 2019-02-01 DIAGNOSIS — Z781 Physical restraint status: Secondary | ICD-10-CM

## 2019-02-01 DIAGNOSIS — Z87891 Personal history of nicotine dependence: Secondary | ICD-10-CM

## 2019-02-01 DIAGNOSIS — Z882 Allergy status to sulfonamides status: Secondary | ICD-10-CM

## 2019-02-01 DIAGNOSIS — W19XXXA Unspecified fall, initial encounter: Secondary | ICD-10-CM | POA: Diagnosis not present

## 2019-02-01 DIAGNOSIS — R7989 Other specified abnormal findings of blood chemistry: Secondary | ICD-10-CM

## 2019-02-01 DIAGNOSIS — S065X9A Traumatic subdural hemorrhage with loss of consciousness of unspecified duration, initial encounter: Principal | ICD-10-CM | POA: Diagnosis present

## 2019-02-01 DIAGNOSIS — Z789 Other specified health status: Secondary | ICD-10-CM

## 2019-02-01 DIAGNOSIS — M6282 Rhabdomyolysis: Secondary | ICD-10-CM | POA: Diagnosis not present

## 2019-02-01 DIAGNOSIS — R5381 Other malaise: Secondary | ICD-10-CM | POA: Diagnosis not present

## 2019-02-01 DIAGNOSIS — M6281 Muscle weakness (generalized): Secondary | ICD-10-CM | POA: Diagnosis not present

## 2019-02-01 DIAGNOSIS — R17 Unspecified jaundice: Secondary | ICD-10-CM

## 2019-02-01 DIAGNOSIS — Z7289 Other problems related to lifestyle: Secondary | ICD-10-CM

## 2019-02-01 DIAGNOSIS — Z85038 Personal history of other malignant neoplasm of large intestine: Secondary | ICD-10-CM

## 2019-02-01 DIAGNOSIS — R296 Repeated falls: Secondary | ICD-10-CM | POA: Diagnosis present

## 2019-02-01 DIAGNOSIS — H6123 Impacted cerumen, bilateral: Secondary | ICD-10-CM | POA: Diagnosis present

## 2019-02-01 LAB — COMPREHENSIVE METABOLIC PANEL
ALT: 18 U/L (ref 0–44)
AST: 70 U/L — ABNORMAL HIGH (ref 15–41)
Albumin: 4.5 g/dL (ref 3.5–5.0)
Alkaline Phosphatase: 74 U/L (ref 38–126)
Anion gap: 8 (ref 5–15)
BUN: 16 mg/dL (ref 8–23)
CO2: 24 mmol/L (ref 22–32)
CREATININE: 0.83 mg/dL (ref 0.61–1.24)
Calcium: 10.1 mg/dL (ref 8.9–10.3)
Chloride: 107 mmol/L (ref 98–111)
GFR calc non Af Amer: >60 mL/min (ref >60)
Glucose, Bld: 119 mg/dL — ABNORMAL HIGH (ref 70–99)
Potassium: 5.1 mmol/L (ref 3.5–5.1)
SODIUM: 139 mmol/L (ref 135–145)
Total Bilirubin: 2.6 mg/dL — ABNORMAL HIGH (ref 0.3–1.2)
Total Protein: 8.4 g/dL — ABNORMAL HIGH (ref 6.5–8.1)

## 2019-02-01 LAB — CBC WITH DIFFERENTIAL/PLATELET
ABS IMMATURE GRANULOCYTES: 0.06 10*3/uL (ref 0.00–0.07)
BASOS PCT: 0 %
Basophils Absolute: 0 10*3/uL (ref 0.0–0.1)
Eosinophils Absolute: 0 10*3/uL (ref 0.0–0.5)
Eosinophils Relative: 0 %
HCT: 43.5 % (ref 39.0–52.0)
Hemoglobin: 14.7 g/dL (ref 13.0–17.0)
IMMATURE GRANULOCYTES: 1 %
Lymphocytes Relative: 13 %
Lymphs Abs: 1.3 10*3/uL (ref 0.7–4.0)
MCH: 31.7 pg (ref 26.0–34.0)
MCHC: 33.8 g/dL (ref 30.0–36.0)
MCV: 93.8 fL (ref 80.0–100.0)
MONOS PCT: 8 %
Monocytes Absolute: 0.8 10*3/uL (ref 0.1–1.0)
NEUTROS ABS: 8.1 10*3/uL — AB (ref 1.7–7.7)
Neutrophils Relative %: 78 %
Platelets: 243 10*3/uL (ref 150–400)
RBC: 4.64 MIL/uL (ref 4.22–5.81)
RDW: 13.5 % (ref 11.5–15.5)
WBC: 10.2 10*3/uL (ref 4.0–10.5)
nRBC: 0 % (ref 0.0–0.2)

## 2019-02-01 LAB — CK: Total CK: 2852 U/L — ABNORMAL HIGH (ref 49–397)

## 2019-02-01 MED ORDER — SODIUM CHLORIDE 0.9 % IV BOLUS
500.0000 mL | Freq: Once | INTRAVENOUS | Status: AC
  Administered 2019-02-01: 500 mL via INTRAVENOUS

## 2019-02-01 NOTE — ED Triage Notes (Signed)
Pt BIB [facility] from home. Pt experienced an unwitnessed fall. Pt reports no LOC, no pain, pt is not on blood thinners. Pt has experienced multiple falls recently. Pt has not seen PCP in 10+ years. No obvious trauma from fall.

## 2019-02-01 NOTE — ED Notes (Signed)
Bed: WA03 Expected date:  Expected time:  Means of arrival:  Comments: 85 yo M/Fall

## 2019-02-01 NOTE — ED Provider Notes (Signed)
[facility] DEPT Provider Note   CSN: [id number] Arrival date & time: 02/01/19  1957   History   Chief Complaint Chief Complaint  Patient presents with  . Fall   HPI Keith Pittman is a 85 y.o. male presenting with his sister today for generalized weakness and falls.  Patient is minimally compliant with history and examination today. - Sister reports that patient has not followed up with a primary care provider in greater than 10 years.  She states that the patient has had increasing weakness and falls over the past several weeks.  She reports that she attempted to call his house today with no answer.  Upon arriving at the house the sister found the patient on the floor unable to get up at that time EMS was called and patient was brought to this ER.  Patient reports that he is slid out of bed today, he denies head injury or loss of consciousness.  Patient denies numbness/weakness or tingling of the extremities.  Patient states that he was too weak to get up off the floor on his own.  Patient reports that he was only on the ground for approximately 5 minutes however this is likely untrue based on his sister's story.  Patient denies any and all pain on my evaluation states that he is feeling well and would like to get up and leave if only had the strength.    HPI  Past Medical History:  Diagnosis Date  . Cancer ([place])    colon  . Diabetes mellitus without complication [facility])     Patient Active Problem List   Diagnosis Date Noted  . Subdural hematoma ([place]) 02/01/2019    Past Surgical History:  Procedure Laterality Date  . COLON SURGERY          Home Medications    Prior to Admission medications   Medication Sig Start Date End Date Taking? Authorizing Provider  acetaminophen (TYLENOL) 325 MG tablet Take 650 mg by mouth every 6 (six) hours as needed.   Yes [provider]  bismuth subsalicylate (PEPTO BISMOL) 262 MG/15ML suspension  Take 30 mLs by mouth every 6 (six) hours as needed for indigestion.   Yes [provider]    Family History No family history on file.  Social History Social History   Tobacco Use  . Smoking status: Former Research scientist (life sciences)  . Smokeless tobacco: Never Used  Substance Use Topics  . Alcohol use: Yes  . Drug use: Not on file     Allergies   Sulfa antibiotics   Review of Systems Review of Systems  Constitutional: Positive for fatigue. Negative for chills and fever.  Eyes: Negative.  Negative for visual disturbance.  Respiratory: Negative.  Negative for shortness of breath.   Cardiovascular: Negative.  Negative for chest pain.  Gastrointestinal: Negative.  Negative for abdominal pain, nausea and vomiting.  Musculoskeletal: Negative.  Negative for arthralgias, back pain, myalgias and neck pain.  Neurological: Positive for weakness (Generalized). Negative for dizziness, syncope, numbness and headaches.  All other systems reviewed and are negative.  Physical Exam Updated Vital Signs BP 118/68   Pulse 71   Resp 16   Ht 5\' 11"  (1.803 m)   SpO2 98%   BMI 32.16 kg/m   Physical Exam Constitutional:      General: He is not in acute distress.    Appearance: Normal appearance. He is well-developed. He is not ill-appearing or diaphoretic.  HENT:     Head: Normocephalic  and atraumatic. No raccoon eyes or Battle's sign.     Jaw: There is normal jaw occlusion. No trismus.     Right Ear: Tympanic membrane, ear canal and external ear normal. There is impacted cerumen. No hemotympanum.     Left Ear: Tympanic membrane, ear canal and external ear normal. There is impacted cerumen. No hemotympanum.     Ears:     Comments: Visualized TM without hemotympanum.    Nose: Nose normal.     Mouth/Throat:     Lips: Pink.     Mouth: Mucous membranes are moist.     Pharynx: Oropharynx is clear. Uvula midline.  Eyes:     General: Vision grossly intact. Gaze aligned appropriately.     Extraocular  Movements: Extraocular movements intact.     Conjunctiva/sclera: Conjunctivae normal.     Pupils: Pupils are equal, round, and reactive to light.     Comments: Visual fields grossly intact bilaterally  Neck:     Musculoskeletal: Normal range of motion and neck supple. No spinous process tenderness or muscular tenderness.     Trachea: Trachea and phonation normal. No tracheal tenderness or tracheal deviation.  Cardiovascular:     Rate and Rhythm: Normal rate and regular rhythm.     Pulses:          Dorsalis pedis pulses are 2+ on the right side and 2+ on the left side.       Posterior tibial pulses are 2+ on the right side and 2+ on the left side.     Heart sounds: Normal heart sounds.  Pulmonary:     Effort: Pulmonary effort is normal. No accessory muscle usage or respiratory distress.     Breath sounds: Normal breath sounds and air entry. No rhonchi or rales.  Chest:     Chest wall: No tenderness.     Comments: No sign of injury of the chest Abdominal:     General: There is no distension.     Palpations: Abdomen is soft.     Tenderness: There is no abdominal tenderness. There is no guarding or rebound.     Comments: No sign of injury of the abdomen  Musculoskeletal: Normal range of motion.     Comments: No midline C/T/L spinal tenderness to palpation, no paraspinal muscle tenderness, no deformity, crepitus, or step-off noted. No sign of injury to the neck or back. - No pain with palpation of the hips bilaterally.  Patient is able to bring knees towards chest bilaterally without pain, he is able to lift his hips off the bed to readjust positioning without difficulty.  No sign of injury, deformity, no pain to bilateral shoulders, elbows, wrists, knees, hips and ankles.  Feet:     Right foot:     Protective Sensation: 5 sites tested. 5 sites sensed.     Left foot:     Protective Sensation: 5 sites tested. 5 sites sensed.  Skin:    General: Skin is warm and dry.     Capillary  Refill: Capillary refill takes less than 2 seconds.  Neurological:     Mental Status: He is alert.     GCS: GCS eye subscore is 4. GCS verbal subscore is 5. GCS motor subscore is 6.     Comments: Mental Status: Alert, oriented, thought content appropriate, able to give a coherent history. Speech fluent without evidence of aphasia. Able to follow 2 step commands without difficulty. Cranial Nerves: II: Peripheral visual fields grossly normal, pupils equal,  round, reactive to light III,IV, VI: ptosis not present, extra-ocular motions intact bilaterally V,VII: smile symmetric, eyebrows raise symmetric, facial light touch sensation equal VIII: hearing grossly normal to voice X: uvula elevates symmetrically XI: bilateral shoulder shrug symmetric and strong XII: midline tongue extension without fassiculations Motor: Normal tone.  3/5 grip strength left upper extremity, 4/5 grip strength right upper extremity.  Equal 4/5 bilateral lower extremity dorsi and plantar flexion. Sensory: Sensation intact to light touch in all extremities. Cerebellar: No pronator drift.  CV: distal pulses palpable throughout  Psychiatric:        Behavior: Behavior normal.    ED Treatments / Results  Labs (all labs ordered are listed, but only abnormal results are displayed) Labs Reviewed  CBC WITH DIFFERENTIAL/PLATELET - Abnormal; Notable for the following components:      Result Value   Neutro Abs 8.1 (*)    All other components within normal limits  COMPREHENSIVE METABOLIC PANEL - Abnormal; Notable for the following components:   Glucose, Bld 119 (*)    Total Protein 8.4 (*)    AST 70 (*)    Total Bilirubin 2.6 (*)    All other components within normal limits  CK - Abnormal; Notable for the following components:   Total CK 2,852 (*)    All other components within normal limits    EKG EKG Interpretation  Date/Time:  Monday February 01 2019 23:37:00 EDT Ventricular Rate:  64 PR Interval:    QRS  Duration: 94 QT Interval:  398 QTC Calculation: 411 R Axis:   56 Text Interpretation:  Sinus rhythm Low voltage, precordial leads since last tracing no significant change Confirmed by Daleen Bo [phone]) on 02/01/2019 11:40:35 PM   Radiology Ct Head Wo Contrast  Addendum Date: 02/01/2019   ADDENDUM REPORT: 02/01/2019 23:10 ADDENDUM: Critical Value/emergent results were called by telephone at the time of interpretation on 02/01/2019 at 2303 hours to [facility] , who verbally acknowledged these results. Electronically Signed   By: Genevie Ann M.D.   On: 02/01/2019 23:10   Result Date: 02/01/2019 CLINICAL DATA:  85 year old male status post unwitnessed fall. EXAM: CT HEAD WITHOUT CONTRAST CT CERVICAL SPINE WITHOUT CONTRAST TECHNIQUE: Multidetector CT imaging of the head and cervical spine was performed following the standard protocol without intravenous contrast. Multiplanar CT image reconstructions of the cervical spine were also generated. COMPARISON:  None. FINDINGS: CT HEAD FINDINGS Brain: Mixed density hemorrhage in the right hemisphere is extra-axial and lobulated but probably subdural. Hyperdense septations or developing thrombus within the lobulated but contiguous collection (sagittal image 16) which measures up to 18-20 millimeters in thickness (series 3, image 19 and coronal image 51). With associated trace leftward midline shift and overall mild mass effect on the right hemisphere. Normal basilar cisterns. No other intracranial hemorrhage identified. Patchy bilateral cerebral white matter hypodensity. No ventriculomegaly. Nonspecific dystrophic calcifications in the pons. No ventriculomegalyor evidence of cortically based acute infarction. No cortical encephalomalacia identified. Vascular: Calcified atherosclerosis at the skull base. No suspicious intracranial vascular hyperdensity. Skull: No skull fracture identified. Sinuses/Orbits: Trace paranasal sinus mucosal thickening. Tympanic cavities  and mastoids are clear. Other: Negative orbits. No scalp hematoma identified. CT CERVICAL SPINE FINDINGS Alignment: Straightening of cervical lordosis. Cervicothoracic junction alignment is within normal limits. Bilateral posterior element alignment is within normal limits. Skull base and vertebrae: Visualized skull base is intact. No atlanto-occipital dissociation. No acute osseous abnormality identified. Soft tissues and spinal canal: No prevertebral fluid or swelling. No visible canal hematoma. Negative noncontrast  neck soft tissues. Disc levels:  Mild for age cervical spine degeneration. Upper chest: Visible upper thoracic levels appear intact. Negative lung apices and negative noncontrast thoracic inlet. IMPRESSION: 1. Mixed density and lobulated right side subdural hematoma. This is age indeterminate and measures up to 18-20 mm in thickness. Associated mild mass effect on the right hemisphere with trace leftward midline shift. No associated skull fracture identified. 2. Nonspecific cerebral white matter changes, most commonly due to small vessel disease. 3.  No acute traumatic injury identified in the cervical spine. Electronically Signed: By: Genevie Ann M.D. On: 02/01/2019 22:57   Ct Cervical Spine Wo Contrast  Addendum Date: 02/01/2019   ADDENDUM REPORT: 02/01/2019 23:10 ADDENDUM: Critical Value/emergent results were called by telephone at the time of interpretation on 02/01/2019 at 2303 hours to [facility] , who verbally acknowledged these results. Electronically Signed   By: Genevie Ann M.D.   On: 02/01/2019 23:10   Result Date: 02/01/2019 CLINICAL DATA:  85 year old male status post unwitnessed fall. EXAM: CT HEAD WITHOUT CONTRAST CT CERVICAL SPINE WITHOUT CONTRAST TECHNIQUE: Multidetector CT imaging of the head and cervical spine was performed following the standard protocol without intravenous contrast. Multiplanar CT image reconstructions of the cervical spine were also generated. COMPARISON:  None.  FINDINGS: CT HEAD FINDINGS Brain: Mixed density hemorrhage in the right hemisphere is extra-axial and lobulated but probably subdural. Hyperdense septations or developing thrombus within the lobulated but contiguous collection (sagittal image 16) which measures up to 18-20 millimeters in thickness (series 3, image 19 and coronal image 51). With associated trace leftward midline shift and overall mild mass effect on the right hemisphere. Normal basilar cisterns. No other intracranial hemorrhage identified. Patchy bilateral cerebral white matter hypodensity. No ventriculomegaly. Nonspecific dystrophic calcifications in the pons. No ventriculomegalyor evidence of cortically based acute infarction. No cortical encephalomalacia identified. Vascular: Calcified atherosclerosis at the skull base. No suspicious intracranial vascular hyperdensity. Skull: No skull fracture identified. Sinuses/Orbits: Trace paranasal sinus mucosal thickening. Tympanic cavities and mastoids are clear. Other: Negative orbits. No scalp hematoma identified. CT CERVICAL SPINE FINDINGS Alignment: Straightening of cervical lordosis. Cervicothoracic junction alignment is within normal limits. Bilateral posterior element alignment is within normal limits. Skull base and vertebrae: Visualized skull base is intact. No atlanto-occipital dissociation. No acute osseous abnormality identified. Soft tissues and spinal canal: No prevertebral fluid or swelling. No visible canal hematoma. Negative noncontrast neck soft tissues. Disc levels:  Mild for age cervical spine degeneration. Upper chest: Visible upper thoracic levels appear intact. Negative lung apices and negative noncontrast thoracic inlet. IMPRESSION: 1. Mixed density and lobulated right side subdural hematoma. This is age indeterminate and measures up to 18-20 mm in thickness. Associated mild mass effect on the right hemisphere with trace leftward midline shift. No associated skull fracture  identified. 2. Nonspecific cerebral white matter changes, most commonly due to small vessel disease. 3.  No acute traumatic injury identified in the cervical spine. Electronically Signed: By: Genevie Ann M.D. On: 02/01/2019 22:57    Procedures .Critical Care Performed by: Deliah Boston, PA-C Authorized by: Deliah Boston, PA-C   Critical care provider statement:    Critical care time (minutes):  35   Critical care was necessary to treat or prevent imminent or life-threatening deterioration of the following conditions:  CNS failure or compromise (Subdural hematoma)   Critical care was time spent personally by me on the following activities:  Discussions with consultants, evaluation of patient's response to treatment, examination of patient, ordering and performing  treatments and interventions, ordering and review of laboratory studies, ordering and review of radiographic studies, pulse oximetry, re-evaluation of patient's condition, obtaining history from patient or surrogate and review of old charts   (including critical care time)  Medications Ordered in ED Medications  sodium chloride 0.9 % bolus 500 mL (500 mLs Intravenous New Bag/Given 02/01/19 2332)     Initial Impression / Assessment and Plan / ED Course  I have reviewed the triage vital signs and the nursing notes.  Pertinent labs & imaging results that were available during my care of the patient were reviewed by me and considered in my medical decision making (see chart for details).    CK 2852 CBC unremarkable CMP with mildly elevated AST otherwise unremarkable EKG without acute findings reviewed by Dr. Eulis Foster CT head/cervical spine:  IMPRESSION:  1. Mixed density and lobulated right side subdural hematoma. This is  age indeterminate and measures up to 18-20 mm in thickness.  Associated mild mass effect on the right hemisphere with trace  leftward midline shift.  No associated skull fracture identified.  2.  Nonspecific cerebral white matter changes, most commonly due to  small vessel disease.  3. No acute traumatic injury identified in the cervical spine.  - Discussed case with on-call neurosurgery who asked that we admit to medicine service, repeat CT head at 6 AM, neurosurgery to see patient in the morning. - Discussed case with hospitalist who is to see patient here in ED. - Patient has been admitted to hospitalist service.   Patient was seen and evaluated by Dr. Eulis Foster during this visit. --- Received call from Dr. Shelba Flake, she is asked that patient be admitted to [facility].  I have informed Dr. Marlowe Sax of the request.    Note: Portions of this report may have been transcribed using voice recognition software. Every effort was made to ensure accuracy; however, inadvertent computerized transcription errors may still be present. Final Clinical Impressions(s) / ED Diagnoses   Final diagnoses:  Subdural hematoma [facility])    ED Discharge Orders    None       Keith Pittman 02/01/19 2356    Daleen Bo, MD 02/03/19 1009

## 2019-02-01 NOTE — ED Provider Notes (Signed)
   Face-to-face evaluation   History: Patient was found on floor by family members today.  He states he fell to get out of bed today.  He is unaware when that happened but thinks it was only a few minutes before his family members got there.  His sister is here with him and states that she tried to get in touch with him for several hours, and he did not answer his phone.  He has been increasingly weak and essentially bedbound for the last several weeks.  He apparently has had several falls, but it is unclear exactly when.  Patient is unable to care for himself by getting his own food, or doing much within his house to help himself.  Has not seen a primary care doctor in 8 or 9 years.  Patient denies headache, neck pain or back pain.  Physical exam: Alert, elderly male.  He is conversant.  He has poor memory.  Head without visible injury.  No injuries to the face.  No deformities of the large joints of arms or legs.  He is able to pull himself up and sit on the stretcher albeit somewhat weak and slow.  He seems to have less strength in his left arm as compared to the right arm.  He has poor hygiene with very long toenails, bilaterally.  Medical screening examination/treatment/procedure(s) were conducted as a shared visit with non-physician practitioner(s) and myself.  I personally evaluated the patient during the encounter    Daleen Bo, MD 02/03/19 1011

## 2019-02-02 ENCOUNTER — Observation Stay (HOSPITAL_COMMUNITY): Payer: Medicare Other

## 2019-02-02 ENCOUNTER — Other Ambulatory Visit: Payer: Self-pay

## 2019-02-02 ENCOUNTER — Observation Stay (HOSPITAL_BASED_OUTPATIENT_CLINIC_OR_DEPARTMENT_OTHER): Payer: Medicare Other

## 2019-02-02 DIAGNOSIS — R7989 Other specified abnormal findings of blood chemistry: Secondary | ICD-10-CM

## 2019-02-02 DIAGNOSIS — Z7289 Other problems related to lifestyle: Secondary | ICD-10-CM | POA: Diagnosis not present

## 2019-02-02 DIAGNOSIS — Z9181 History of falling: Secondary | ICD-10-CM | POA: Diagnosis not present

## 2019-02-02 DIAGNOSIS — R41841 Cognitive communication deficit: Secondary | ICD-10-CM | POA: Diagnosis not present

## 2019-02-02 DIAGNOSIS — Z87891 Personal history of nicotine dependence: Secondary | ICD-10-CM | POA: Diagnosis not present

## 2019-02-02 DIAGNOSIS — E119 Type 2 diabetes mellitus without complications: Secondary | ICD-10-CM | POA: Diagnosis not present

## 2019-02-02 DIAGNOSIS — Z781 Physical restraint status: Secondary | ICD-10-CM | POA: Diagnosis not present

## 2019-02-02 DIAGNOSIS — M6282 Rhabdomyolysis: Secondary | ICD-10-CM | POA: Diagnosis not present

## 2019-02-02 DIAGNOSIS — F0390 Unspecified dementia without behavioral disturbance: Secondary | ICD-10-CM | POA: Diagnosis not present

## 2019-02-02 DIAGNOSIS — F419 Anxiety disorder, unspecified: Secondary | ICD-10-CM | POA: Diagnosis not present

## 2019-02-02 DIAGNOSIS — S065X0A Traumatic subdural hemorrhage without loss of consciousness, initial encounter: Secondary | ICD-10-CM | POA: Diagnosis not present

## 2019-02-02 DIAGNOSIS — H6123 Impacted cerumen, bilateral: Secondary | ICD-10-CM | POA: Diagnosis not present

## 2019-02-02 DIAGNOSIS — Z789 Other specified health status: Secondary | ICD-10-CM

## 2019-02-02 DIAGNOSIS — I6521 Occlusion and stenosis of right carotid artery: Secondary | ICD-10-CM | POA: Diagnosis not present

## 2019-02-02 DIAGNOSIS — I6200 Nontraumatic subdural hemorrhage, unspecified: Secondary | ICD-10-CM | POA: Diagnosis not present

## 2019-02-02 DIAGNOSIS — R945 Abnormal results of liver function studies: Secondary | ICD-10-CM | POA: Diagnosis not present

## 2019-02-02 DIAGNOSIS — F05 Delirium due to known physiological condition: Secondary | ICD-10-CM | POA: Diagnosis not present

## 2019-02-02 DIAGNOSIS — R531 Weakness: Secondary | ICD-10-CM | POA: Diagnosis not present

## 2019-02-02 DIAGNOSIS — S065X9D Traumatic subdural hemorrhage with loss of consciousness of unspecified duration, subsequent encounter: Secondary | ICD-10-CM | POA: Diagnosis not present

## 2019-02-02 DIAGNOSIS — R278 Other lack of coordination: Secondary | ICD-10-CM | POA: Diagnosis not present

## 2019-02-02 DIAGNOSIS — R296 Repeated falls: Secondary | ICD-10-CM

## 2019-02-02 DIAGNOSIS — Y92003 Bedroom of unspecified non-institutional (private) residence as the place of occurrence of the external cause: Secondary | ICD-10-CM | POA: Diagnosis not present

## 2019-02-02 DIAGNOSIS — R52 Pain, unspecified: Secondary | ICD-10-CM

## 2019-02-02 DIAGNOSIS — K30 Functional dyspepsia: Secondary | ICD-10-CM | POA: Diagnosis not present

## 2019-02-02 DIAGNOSIS — I493 Ventricular premature depolarization: Secondary | ICD-10-CM | POA: Diagnosis not present

## 2019-02-02 DIAGNOSIS — Z882 Allergy status to sulfonamides status: Secondary | ICD-10-CM | POA: Diagnosis not present

## 2019-02-02 DIAGNOSIS — R55 Syncope and collapse: Secondary | ICD-10-CM | POA: Diagnosis not present

## 2019-02-02 DIAGNOSIS — R451 Restlessness and agitation: Secondary | ICD-10-CM | POA: Diagnosis not present

## 2019-02-02 DIAGNOSIS — Z85038 Personal history of other malignant neoplasm of large intestine: Secondary | ICD-10-CM | POA: Diagnosis not present

## 2019-02-02 DIAGNOSIS — W1830XA Fall on same level, unspecified, initial encounter: Secondary | ICD-10-CM | POA: Diagnosis not present

## 2019-02-02 DIAGNOSIS — F1090 Alcohol use, unspecified, uncomplicated: Secondary | ICD-10-CM

## 2019-02-02 DIAGNOSIS — S065X9A Traumatic subdural hemorrhage with loss of consciousness of unspecified duration, initial encounter: Secondary | ICD-10-CM | POA: Diagnosis not present

## 2019-02-02 DIAGNOSIS — R2681 Unsteadiness on feet: Secondary | ICD-10-CM | POA: Diagnosis not present

## 2019-02-02 DIAGNOSIS — M6281 Muscle weakness (generalized): Secondary | ICD-10-CM | POA: Diagnosis not present

## 2019-02-02 LAB — RAPID URINE DRUG SCREEN, HOSP PERFORMED
Amphetamines: NOT DETECTED
Barbiturates: NOT DETECTED
Benzodiazepines: NOT DETECTED
Cocaine: NOT DETECTED
Opiates: NOT DETECTED
Tetrahydrocannabinol: NOT DETECTED

## 2019-02-02 LAB — BASIC METABOLIC PANEL
Anion gap: 8 (ref 5–15)
BUN: 13 mg/dL (ref 8–23)
CO2: 23 mmol/L (ref 22–32)
Calcium: 9.8 mg/dL (ref 8.9–10.3)
Chloride: 109 mmol/L (ref 98–111)
Creatinine, Ser: 0.84 mg/dL (ref 0.61–1.24)
GFR calc Af Amer: >60 mL/min (ref >60)
GFR calc non Af Amer: >60 mL/min (ref >60)
Glucose, Bld: 118 mg/dL — ABNORMAL HIGH (ref 70–99)
POTASSIUM: 3.4 mmol/L — AB (ref 3.5–5.1)
SODIUM: 140 mmol/L (ref 135–145)

## 2019-02-02 LAB — GLUCOSE, CAPILLARY
Glucose-Capillary: 107 mg/dL — ABNORMAL HIGH (ref 70–99)
Glucose-Capillary: 84 mg/dL (ref 70–99)

## 2019-02-02 LAB — ECHOCARDIOGRAM COMPLETE: HEIGHTINCHES: 71 in

## 2019-02-02 LAB — MAGNESIUM: MAGNESIUM: 2.2 mg/dL (ref 1.7–2.4)

## 2019-02-02 MED ORDER — LORAZEPAM 1 MG PO TABS: 1.0000 mg | ORAL_TABLET | Freq: Four times a day (QID) | ORAL | Status: AC | PRN

## 2019-02-02 MED ORDER — SODIUM CHLORIDE 0.9 % IV SOLN
INTRAVENOUS | Status: DC
  Administered 2019-02-02: 04:00:00 via INTRAVENOUS

## 2019-02-02 MED ORDER — ACETAMINOPHEN 325 MG PO TABS
650.0000 mg | ORAL_TABLET | Freq: Four times a day (QID) | ORAL | Status: DC | PRN
  Administered 2019-02-02 – 2019-02-03 (×2): 650 mg via ORAL
  Filled 2019-02-02 (×2): qty 2

## 2019-02-02 MED ORDER — LACTATED RINGERS IV SOLN
INTRAVENOUS | Status: AC
  Administered 2019-02-02 – 2019-02-03 (×2): via INTRAVENOUS

## 2019-02-02 MED ORDER — THIAMINE HCL 100 MG/ML IJ SOLN
100.0000 mg | Freq: Every day | INTRAMUSCULAR | Status: DC
  Filled 2019-02-02: qty 2

## 2019-02-02 MED ORDER — FOLIC ACID 1 MG PO TABS
1.0000 mg | ORAL_TABLET | Freq: Every day | ORAL | Status: DC
  Administered 2019-02-02 – 2019-02-05 (×4): 1 mg via ORAL
  Filled 2019-02-02 (×4): qty 1

## 2019-02-02 MED ORDER — POTASSIUM CHLORIDE CRYS ER 20 MEQ PO TBCR
40.0000 meq | EXTENDED_RELEASE_TABLET | Freq: Once | ORAL | Status: AC
  Administered 2019-02-02: 40 meq via ORAL
  Filled 2019-02-02: qty 2

## 2019-02-02 MED ORDER — LORAZEPAM 2 MG/ML IJ SOLN
1.0000 mg | Freq: Four times a day (QID) | INTRAMUSCULAR | Status: AC | PRN
  Administered 2019-02-04: 1 mg via INTRAVENOUS
  Filled 2019-02-02: qty 1

## 2019-02-02 MED ORDER — VITAMIN B-1 100 MG PO TABS
100.0000 mg | ORAL_TABLET | Freq: Every day | ORAL | Status: DC
  Administered 2019-02-02 – 2019-02-05 (×4): 100 mg via ORAL
  Filled 2019-02-02 (×4): qty 1

## 2019-02-02 MED ORDER — ACETAMINOPHEN 650 MG RE SUPP: 650.0000 mg | Freq: Four times a day (QID) | RECTAL | Status: DC | PRN

## 2019-02-02 MED ORDER — SODIUM CHLORIDE 0.9 % IV BOLUS: 500.0000 mL | Freq: Once | INTRAVENOUS | Status: DC

## 2019-02-02 MED ORDER — ADULT MULTIVITAMIN W/MINERALS CH
1.0000 | ORAL_TABLET | Freq: Every day | ORAL | Status: DC
  Administered 2019-02-02 – 2019-02-05 (×4): 1 via ORAL
  Filled 2019-02-02 (×4): qty 1

## 2019-02-02 MED ORDER — INSULIN ASPART 100 UNIT/ML ~~LOC~~ SOLN
0.0000 [IU] | Freq: Three times a day (TID) | SUBCUTANEOUS | Status: DC
  Administered 2019-02-04 – 2019-02-05 (×4): 1 [IU] via SUBCUTANEOUS
  Administered 2019-02-05: 2 [IU] via SUBCUTANEOUS

## 2019-02-02 NOTE — Progress Notes (Signed)
Patient is admitted this am due to unwitnessed fall, found to have subdural hematoma, rhabdomyolysis.  He is getting hydration, repeat ct head with slightly enlarging hematoma without midline shift, neurosurgery following.  I talked to patient sister  [name] who is  patient's HPOA, she is a retired Therapist, sports, she reports patient has dementia, patient has not seen a physician or taken any meds for a few yrs due to his refusal.  [name] reports patient lives by himself, but mostly stay in bed. [name] brings him dinner daily. [name] reports patient continuse to drink alcohol, patient has decreased appetite, weight loss and progressive weakness, falls for the last 76months.  [name] agrees that patient will benefit from SNF placement.

## 2019-02-02 NOTE — Progress Notes (Signed)
Patient left for CT scan. 

## 2019-02-02 NOTE — Progress Notes (Signed)
Patient received alert and oriented x 4, head to toe assessment done. On assessment, a small swelling area at the back of head, with black scabs to bilateral lower legs,and mid back with long curved toenails. Patient made comfortable in bed will continue monitor.

## 2019-02-02 NOTE — H&P (Signed)
History and Physical    Keith Pittman HKU:[id number] DOB: September 15, 1932 DOA: 02/01/2019  PCP: Lavone Orn, MD Patient coming from: Home  Chief Complaint: Fall  HPI: Keith Pittman is a 85 y.o. male with medical history significant of colon cancer, type 2 diabetes presenting to the hospital via EMS for evaluation after an unwitnessed fall at home.  Patient states he has had multiple falls in the past.  He is not sure how he fell earlier today.  Denies having any dizziness, chest pain, or shortness of breath prior to the fall.  Sister at bedside states she brings dinner for the patient and usually calls him before arriving his house.  Today around dinnertime when she called the patient there was no reply.  When family arrived, patient was found facedown on his bedroom floor but was conscious.  He could not get up on his own and paramedics had to assist him.  Sister states patient's balance has been worse for the past 1 to 2 weeks and he does not use a walker to ambulate.  Patient denies having any headaches, neck pain, or pain anywhere else.  Denies having any fevers, chills, cough, abdominal pain, or diarrhea.  Denies having any dysuria, urinary frequency, or urgency.  Patient reports drinking beer but denies daily use; could not obtain any more details from him.  Review of Systems: As per HPI otherwise 10 point review of systems negative.  Past Medical History:  Diagnosis Date  . Cancer ([place])    colon  . Diabetes mellitus without complication [facility])     Past Surgical History:  Procedure Laterality Date  . COLON SURGERY       reports that he has quit smoking. He has never used smokeless tobacco. He reports current alcohol use. No history on file for drug.  Allergies  Allergen Reactions  . Sulfa Antibiotics     Sulfa containing drugs     No family history on file.  Prior to Admission medications   Medication Sig Start Date End Date Taking? Authorizing Provider  acetaminophen  (TYLENOL) 325 MG tablet Take 650 mg by mouth every 6 (six) hours as needed.   Yes [provider]  bismuth subsalicylate (PEPTO BISMOL) 262 MG/15ML suspension Take 30 mLs by mouth every 6 (six) hours as needed for indigestion.   Yes [provider]    Physical Exam: Vitals:   02/01/19 2015 02/01/19 2100 02/01/19 2200 02/02/19 0000  BP:  128/77 118/68 116/90  Pulse:  73 71 68  Resp:  18 16 13   SpO2:  98% 98% 97%  Height: 5\' 11"  (1.803 m)       Physical Exam  Constitutional: He is oriented to person, place, and time. No distress.  HENT:  Head: Normocephalic.  Mouth/Throat: Oropharynx is clear and moist.  Eyes: Pupils are equal, round, and reactive to light. EOM are normal. Right eye exhibits no discharge. Left eye exhibits no discharge.  Neck: Neck supple.  Cardiovascular: Normal rate, regular rhythm and intact distal pulses.  Pulmonary/Chest: Effort normal and breath sounds normal. No respiratory distress. He has no wheezes. He has no rales.  Abdominal: Soft. Bowel sounds are normal. He exhibits no distension. There is no abdominal tenderness. There is no guarding.  Musculoskeletal:        General: No edema.  Neurological: He is alert and oriented to person, place, and time. No cranial nerve deficit.  Speech fluent, tongue midline, no facial droop. Strength 5 out of 5 in  bilateral upper and lower extremities.  Sensation to light touch intact throughout.  Skin: Skin is warm and dry. He is not diaphoretic.     Labs on Admission: I have personally reviewed following labs and imaging studies  CBC: Recent Labs  Lab 02/01/19 2237  WBC 10.2  NEUTROABS 8.1*  HGB 14.7  HCT 43.5  MCV 93.8  PLT 681   Basic Metabolic Panel: Recent Labs  Lab 02/01/19 2237  NA 139  K 5.1  CL 107  CO2 24  GLUCOSE 119*  BUN 16  CREATININE 0.83  CALCIUM 10.1   GFR: CrCl cannot be calculated (Unknown ideal weight.). Liver Function Tests: Recent Labs  Lab 02/01/19 2237    AST 70*  ALT 18  ALKPHOS 74  BILITOT 2.6*  PROT 8.4*  ALBUMIN 4.5   No results for input(s): LIPASE, AMYLASE in the last 168 hours. No results for input(s): AMMONIA in the last 168 hours. Coagulation Profile: No results for input(s): INR, PROTIME in the last 168 hours. Cardiac Enzymes: Recent Labs  Lab 02/01/19 2237  CKTOTAL 2,852*   BNP (last 3 results) No results for input(s): PROBNP in the last 8760 hours. HbA1C: No results for input(s): HGBA1C in the last 72 hours. CBG: No results for input(s): GLUCAP in the last 168 hours. Lipid Profile: No results for input(s): CHOL, HDL, LDLCALC, TRIG, CHOLHDL, LDLDIRECT in the last 72 hours. Thyroid Function Tests: No results for input(s): TSH, T4TOTAL, FREET4, T3FREE, THYROIDAB in the last 72 hours. Anemia Panel: No results for input(s): VITAMINB12, FOLATE, FERRITIN, TIBC, IRON, RETICCTPCT in the last 72 hours. Urine analysis: No results found for: COLORURINE, APPEARANCEUR, LABSPEC, [place], GLUCOSEU, HGBUR, BILIRUBINUR, KETONESUR, PROTEINUR, UROBILINOGEN, NITRITE, LEUKOCYTESUR  Radiological Exams on Admission: Ct Head Wo Contrast  Addendum Date: 02/01/2019   ADDENDUM REPORT: 02/01/2019 23:10 ADDENDUM: Critical Value/emergent results were called by telephone at the time of interpretation on 02/01/2019 at 2303 hours to [facility] , who verbally acknowledged these results. Electronically Signed   By: Genevie Ann M.D.   On: 02/01/2019 23:10   Result Date: 02/01/2019 CLINICAL DATA:  85 year old male status post unwitnessed fall. EXAM: CT HEAD WITHOUT CONTRAST CT CERVICAL SPINE WITHOUT CONTRAST TECHNIQUE: Multidetector CT imaging of the head and cervical spine was performed following the standard protocol without intravenous contrast. Multiplanar CT image reconstructions of the cervical spine were also generated. COMPARISON:  None. FINDINGS: CT HEAD FINDINGS Brain: Mixed density hemorrhage in the right hemisphere is extra-axial and lobulated  but probably subdural. Hyperdense septations or developing thrombus within the lobulated but contiguous collection (sagittal image 16) which measures up to 18-20 millimeters in thickness (series 3, image 19 and coronal image 51). With associated trace leftward midline shift and overall mild mass effect on the right hemisphere. Normal basilar cisterns. No other intracranial hemorrhage identified. Patchy bilateral cerebral white matter hypodensity. No ventriculomegaly. Nonspecific dystrophic calcifications in the pons. No ventriculomegalyor evidence of cortically based acute infarction. No cortical encephalomalacia identified. Vascular: Calcified atherosclerosis at the skull base. No suspicious intracranial vascular hyperdensity. Skull: No skull fracture identified. Sinuses/Orbits: Trace paranasal sinus mucosal thickening. Tympanic cavities and mastoids are clear. Other: Negative orbits. No scalp hematoma identified. CT CERVICAL SPINE FINDINGS Alignment: Straightening of cervical lordosis. Cervicothoracic junction alignment is within normal limits. Bilateral posterior element alignment is within normal limits. Skull base and vertebrae: Visualized skull base is intact. No atlanto-occipital dissociation. No acute osseous abnormality identified. Soft tissues and spinal canal: No prevertebral fluid or swelling. No visible canal hematoma.  Negative noncontrast neck soft tissues. Disc levels:  Mild for age cervical spine degeneration. Upper chest: Visible upper thoracic levels appear intact. Negative lung apices and negative noncontrast thoracic inlet. IMPRESSION: 1. Mixed density and lobulated right side subdural hematoma. This is age indeterminate and measures up to 18-20 mm in thickness. Associated mild mass effect on the right hemisphere with trace leftward midline shift. No associated skull fracture identified. 2. Nonspecific cerebral white matter changes, most commonly due to small vessel disease. 3.  No acute  traumatic injury identified in the cervical spine. Electronically Signed: By: Genevie Ann M.D. On: 02/01/2019 22:57   Ct Cervical Spine Wo Contrast  Addendum Date: 02/01/2019   ADDENDUM REPORT: 02/01/2019 23:10 ADDENDUM: Critical Value/emergent results were called by telephone at the time of interpretation on 02/01/2019 at 2303 hours to [facility] , who verbally acknowledged these results. Electronically Signed   By: Genevie Ann M.D.   On: 02/01/2019 23:10   Result Date: 02/01/2019 CLINICAL DATA:  85 year old male status post unwitnessed fall. EXAM: CT HEAD WITHOUT CONTRAST CT CERVICAL SPINE WITHOUT CONTRAST TECHNIQUE: Multidetector CT imaging of the head and cervical spine was performed following the standard protocol without intravenous contrast. Multiplanar CT image reconstructions of the cervical spine were also generated. COMPARISON:  None. FINDINGS: CT HEAD FINDINGS Brain: Mixed density hemorrhage in the right hemisphere is extra-axial and lobulated but probably subdural. Hyperdense septations or developing thrombus within the lobulated but contiguous collection (sagittal image 16) which measures up to 18-20 millimeters in thickness (series 3, image 19 and coronal image 51). With associated trace leftward midline shift and overall mild mass effect on the right hemisphere. Normal basilar cisterns. No other intracranial hemorrhage identified. Patchy bilateral cerebral white matter hypodensity. No ventriculomegaly. Nonspecific dystrophic calcifications in the pons. No ventriculomegalyor evidence of cortically based acute infarction. No cortical encephalomalacia identified. Vascular: Calcified atherosclerosis at the skull base. No suspicious intracranial vascular hyperdensity. Skull: No skull fracture identified. Sinuses/Orbits: Trace paranasal sinus mucosal thickening. Tympanic cavities and mastoids are clear. Other: Negative orbits. No scalp hematoma identified. CT CERVICAL SPINE FINDINGS Alignment:  Straightening of cervical lordosis. Cervicothoracic junction alignment is within normal limits. Bilateral posterior element alignment is within normal limits. Skull base and vertebrae: Visualized skull base is intact. No atlanto-occipital dissociation. No acute osseous abnormality identified. Soft tissues and spinal canal: No prevertebral fluid or swelling. No visible canal hematoma. Negative noncontrast neck soft tissues. Disc levels:  Mild for age cervical spine degeneration. Upper chest: Visible upper thoracic levels appear intact. Negative lung apices and negative noncontrast thoracic inlet. IMPRESSION: 1. Mixed density and lobulated right side subdural hematoma. This is age indeterminate and measures up to 18-20 mm in thickness. Associated mild mass effect on the right hemisphere with trace leftward midline shift. No associated skull fracture identified. 2. Nonspecific cerebral white matter changes, most commonly due to small vessel disease. 3.  No acute traumatic injury identified in the cervical spine. Electronically Signed: By: Genevie Ann M.D. On: 02/01/2019 22:57    EKG: Independently reviewed.  Sinus rhythm (heart rate 64).  No acute ischemic changes.  Assessment/Plan Principal Problem:   Subdural hematoma (HCC) Active Problems:   Unwitnessed fall   Rhabdomyolysis   Elevated LFTs   Alcohol use   Subdural hematoma  -Patient reports multiple falls at home.  He was found by family after an unwitnessed fall at home on 3/9. -Hemodynamically stable.  No focal neuro deficit on exam. -CT head with evidence of mixed density and lobulated  right-sided subdural hematoma, age indeterminate, measuring up to 18 to 20 mm in thickness with associated mild mass-effect on the right hemisphere with trace leftward midline shift. -Discussed with neurosurgery.  Recommendation is to transfer patient to [facility] and repeat head CT in the morning.  No steroids recommended at this time. -Repeat head CT without contrast  at 6 AM -Frequent neurochecks  Unwitnessed fall/ ?syncope -Patient reports multiple falls at home.  Fall on 3/9 was unwitnessed.  He reports drinking beer but denies daily use. -White count borderline elevated at 10.2.  No fever recorded in the ED.  Satting well on room air and patient has no respiratory complaints.  Check urinalysis to rule out UTI. -Check blood ethanol level -Check orthostatics -Cardiac monitoring -Echocardiogram -Carotid Dopplers -EEG -PT and OT eval  Rhabdomyolysis -CK elevated at 2852.  Patient found at home after an unwitnessed fall, unclear how long he was on the ground. -IV fluid hydration -Repeat CK in a.m. -EEG to rule out seizure  Elevated LFTs AST 70, T bili 2.6.  ALT and ALP normal.  Abdominal exam benign.  No complaints of nausea, vomiting, or abdominal pain. -Right upper quadrant ultrasound -Continue to monitor LFTs  Alcohol use Patient reports drinking beer but denies daily use.  Could not obtain any more details from him.  No signs of withdrawal at this time. -Check blood ethanol level -CIWA protocol; Ativan PRN -Multivitamin, folate, thiamine  Type 2 diabetes Blood glucose 119.  Currently diet controlled. -Check A1c.  Sliding scale insulin sensitive and CBG checks.  DVT prophylaxis: SCDs Code Status: Full code.  Discussed with patient and his sister at bedside. Family Communication: Sister at bedside. Disposition Plan: Anticipate discharge after clinical improvement. Consults called: Neurosurgery NP [facility] Admission status: Observation, telemetry   Shela Leff MD [facility] Pager [phone]  If 7PM-7AM, please contact night-coverage [email or web address] Password [email or web address]  02/02/2019, 12:48 AM

## 2019-02-02 NOTE — Evaluation (Signed)
Physical Therapy Evaluation Patient Details Name: Keith Pittman MRN: [id number] DOB: 1932/01/22 Today's Date: 02/02/2019   History of Present Illness  Pt is a 85 y/o male with PMH of colon cancer, type 2 diabetes presenting to hospital for evaluation after unwitnessed fall at home. Pt unable to recall how he fell, found in bedroom floor conscious. CT reveals: mixed density and lobulated right-sided subdural hematoma, age indeterminate, measuring up to 18 to 20 mm in thickness with associated mild mass-effect on the right hemisphere with trace leftward midline shift.   Clinical Impression  Patient presents with left sided weakness, impaired balance, cognitive deficits- impaired attention, problem solving, safety awareness, memory etc and impaired mobility s/p above. Pt with no awareness of deficits. Pt reports being independent with ADLs and ambulation PTA, living alone. His sister checks on him daily. Denies falls despite report of multiple falls at home. Not safe to return home alone at this time as pt high fall risk. Tolerated gait training with Min-Mod A for safety/balance. Would benefit from SNF to maximize independence and mobility prior to return home. Will follow acutely.     Follow Up Recommendations SNF;Supervision for mobility/OOB    Equipment Recommendations  Rolling walker with 5" wheels    Recommendations for Other Services       Precautions / Restrictions Precautions Precautions: Fall Restrictions Weight Bearing Restrictions: No      Mobility  Bed Mobility Overal bed mobility: Needs Assistance Bed Mobility: Rolling;Sidelying to Sit Rolling: Supervision Sidelying to sit: Min assist       General bed mobility comments: Sitting EOB upon PT arrival.  Transfers Overall transfer level: Needs assistance Equipment used: Rolling walker (2 wheeled) Transfers: Sit to/from Stand Sit to Stand: Mod assist;+2 physical assistance;+2 safety/equipment         General  transfer comment: Assist to power to standing with cues for hand placement/technique. Stood from [employer], transferred to chair post ambulation.  Ambulation/Gait Ambulation/Gait assistance: Min assist;Mod assist Gait Distance (Feet): 50 Feet Assistive device: Rolling walker (2 wheeled) Gait Pattern/deviations: Step-to pattern;Step-through pattern;Decreased stance time - left;Trunk flexed;Decreased stride length Gait velocity: decreased   General Gait Details: Slow, unsteady gait with forward flexed posture and left lean; initially Min A progressing to Mod A when fatigued. Cues for RW management/proximity. poor advancement LLE esp when fatigued.   Stairs            Wheelchair Mobility    Modified Rankin (Stroke Patients Only) Modified Rankin (Stroke Patients Only) Pre-Morbid Rankin Score: Slight disability Modified Rankin: Moderately severe disability     Balance Overall balance assessment: Needs assistance;History of Falls Sitting-balance support: Feet supported;No upper extremity supported Sitting balance-Leahy Scale: Fair Sitting balance - Comments: poor dynmaic balance, unable to don socks   Standing balance support: During functional activity;Bilateral upper extremity supported Standing balance-Leahy Scale: Poor Standing balance comment: reliant on BUE support                             Pertinent Vitals/Pain Pain Assessment: Faces Faces Pain Scale: Hurts a little bit Pain Location: abdomen, with bed mobility  Pain Descriptors / Indicators: Discomfort Pain Intervention(s): Monitored during session;Repositioned    Home Living Family/patient expects to be discharged to:: Private residence Living Arrangements: Alone Available Help at Discharge: Family;Available PRN/intermittently Type of Home: House Home Access: Level entry     Home Layout: One level Home Equipment: None      Prior Function Level  of Independence: Independent         Comments:  independent ADLs, IADLs, not driving--reports family checks in on him daily      Hand Dominance   Dominant Hand: Right    Extremity/Trunk Assessment   Upper Extremity Assessment Upper Extremity Assessment: Defer to OT evaluation LUE Deficits / Details: appears grossly weaker than R UE, 3+/5 with limited shoulder AROM.   LUE Sensation: WNL LUE Coordination: WNL    Lower Extremity Assessment Lower Extremity Assessment: LLE deficits/detail LLE Deficits / Details: Grossly ~4/5 throughout with some giveway. LLE Sensation: WNL LLE Coordination: WNL       Communication   Communication: No difficulties  Cognition Arousal/Alertness: Awake/alert Behavior During Therapy: Flat affect Overall Cognitive Status: No family/caregiver present to determine baseline cognitive functioning Area of Impairment: Attention;Orientation;Memory;Safety/judgement;Following commands;Awareness;Problem solving                 Orientation Level: Disoriented to;Situation;Time Current Attention Level: Sustained Memory: Decreased short-term memory;Decreased recall of precautions Following Commands: Follows one step commands consistently;Follows one step commands with increased time Safety/Judgement: Decreased awareness of safety;Decreased awareness of deficits Awareness: Emergent Problem Solving: Slow processing;Decreased initiation;Difficulty sequencing;Requires verbal cues;Requires tactile cues General Comments: Pt oriented to self and place, unaware of situation and denies falling at home.  Poor awareness to deficits and need for assistance, but able to follow 1 step commands with increased time.       General Comments      Exercises     Assessment/Plan    PT Assessment Patient needs continued PT services  PT Problem List Decreased strength;Decreased balance;Decreased cognition;Decreased knowledge of use of DME;Decreased mobility;Decreased activity tolerance;Decreased safety awareness        PT Treatment Interventions Balance training;Patient/family education;Gait training;Therapeutic activities;Therapeutic exercise;Neuromuscular re-education;Cognitive remediation;DME instruction;Functional mobility training    PT Goals (Current goals can be found in the Care Plan section)  Acute Rehab PT Goals Patient Stated Goal: none stated PT Goal Formulation: With patient Time For Goal Achievement: 02/16/19 Potential to Achieve Goals: Good    Frequency Min 3X/week   Barriers to discharge Decreased caregiver support lives alone    Co-evaluation PT/OT/SLP Co-Evaluation/Treatment: Yes Reason for Co-Treatment: To address functional/ADL transfers;For patient/therapist safety PT goals addressed during session: Mobility/safety with mobility;Balance;Strengthening/ROM;Proper use of DME OT goals addressed during session: ADL's and self-care       AM-PAC PT "6 Clicks" Mobility  Outcome Measure Help needed turning from your back to your side while in a flat bed without using bedrails?: A Little Help needed moving from lying on your back to sitting on the side of a flat bed without using bedrails?: A Lot Help needed moving to and from a bed to a chair (including a wheelchair)?: A Lot Help needed standing up from a chair using your arms (e.g., wheelchair or bedside chair)?: A Lot Help needed to walk in hospital room?: A Lot Help needed climbing 3-5 steps with a railing? : Total 6 Click Score: 12    End of Session Equipment Utilized During Treatment: Gait belt Activity Tolerance: Patient tolerated treatment well Patient left: in chair;with call bell/phone within reach;with chair alarm set;with nursing/sitter in room Nurse Communication: Mobility status PT Visit Diagnosis: Unsteadiness on feet (R26.81);Difficulty in walking, not elsewhere classified (R26.2)    Time: 3267-1245 PT Time Calculation (min) (ACUTE ONLY): 16 min   Charges:   PT Evaluation $PT Eval Moderate Complexity: 1  Mod          [name], PT, DPT Acute  Rehabilitation Services Pager [phone] Office [phone]      Lacie Draft 02/02/2019, 12:58 PM

## 2019-02-02 NOTE — ED Notes (Signed)
ED TO INPATIENT HANDOFF REPORT  ED Nurse Name and Phone #: Jaye Beagle, RN [phone]  S Name/Age/Gender Keith Pittman 85 y.o. male Room/Bed: WA03/WA03  Code Status   Code Status: Full Code  Home/SNF/Other Home Patient oriented to: self, place and situation Is this baseline? Yes   Triage Complete: Triage complete  Chief Complaint Fall/Balance Issues  Triage Note Pt BIB [facility] from home. Pt experienced an unwitnessed fall. Pt reports no LOC, no pain, pt is not on blood thinners. Pt has experienced multiple falls recently. Pt has not seen PCP in 10+ years. No obvious trauma from fall.    Allergies Allergies  Allergen Reactions  . Sulfa Antibiotics     Sulfa containing drugs     Level of Care/Admitting Diagnosis ED Disposition    ED Disposition Condition [facility] Area: [place] [100100]  Level of Care: Medical Telemetry [104]  I expect the patient will be discharged within 24 hours: Yes  LOW acuity---Tx typically complete <24 hrs---ACUTE conditions typically can be evaluated <24 hours---LABS likely to return to acceptable levels <24 hours---IS near functional baseline---EXPECTED to return to current living arrangement---NOT newly hypoxic: Meets criteria for 5C-Observation unit  Diagnosis: Subdural hematoma [facility]) [725366]  Admitting Physician: Shela Leff [[id number]]  Attending Physician: Shela Leff [[id number]]  PT Class (Do Not Modify): Observation [104]  PT Acc Code (Do Not Modify): Observation [10022]       B Medical/Surgery History Past Medical History:  Diagnosis Date  . Cancer ([place])    colon  . Diabetes mellitus without complication [facility])    Past Surgical History:  Procedure Laterality Date  . COLON SURGERY       A IV Location/Drains/Wounds Patient Lines/Drains/Airways Status   Active Line/Drains/Airways    Name:   Placement date:   Placement time:   Site:   Days:   Peripheral IV 02/01/19 Right Antecubital    02/01/19    2004    Antecubital   1          Intake/Output Last 24 hours No intake or output data in the 24 hours ending 02/02/19 0221  Labs/Imaging Results for orders placed or performed during the hospital encounter of 02/01/19 (from the past 48 hour(s))  CBC with Differential     Status: Abnormal   Collection Time: 02/01/19 10:37 PM  Result Value Ref Range   WBC 10.2 4.0 - 10.5 K/uL    Comment: WHITE COUNT CONFIRMED ON SMEAR   RBC 4.64 4.22 - 5.81 MIL/uL   Hemoglobin 14.7 13.0 - 17.0 g/dL   HCT 43.5 39.0 - 52.0 %   MCV 93.8 80.0 - 100.0 fL   MCH 31.7 26.0 - 34.0 pg   MCHC 33.8 30.0 - 36.0 g/dL   RDW 13.5 11.5 - 15.5 %   Platelets 243 150 - 400 K/uL    Comment: REPEATED TO VERIFY SPECIMEN CHECKED FOR CLOTS    nRBC 0.0 0.0 - 0.2 %   Neutrophils Relative % 78 %   Neutro Abs 8.1 (H) 1.7 - 7.7 K/uL   Lymphocytes Relative 13 %   Lymphs Abs 1.3 0.7 - 4.0 K/uL   Monocytes Relative 8 %   Monocytes Absolute 0.8 0.1 - 1.0 K/uL   Eosinophils Relative 0 %   Eosinophils Absolute 0.0 0.0 - 0.5 K/uL   Basophils Relative 0 %   Basophils Absolute 0.0 0.0 - 0.1 K/uL   Immature Granulocytes 1 %   Abs Immature Granulocytes 0.06 0.00 -  0.07 K/uL    Comment: Performed at [facility], [place] [address], [place], [place] [address]  Comprehensive metabolic panel     Status: Abnormal   Collection Time: 02/01/19 10:37 PM  Result Value Ref Range   Sodium 139 135 - 145 mmol/L   Potassium 5.1 3.5 - 5.1 mmol/L   Chloride 107 98 - 111 mmol/L   CO2 24 22 - 32 mmol/L   Glucose, Bld 119 (H) 70 - 99 mg/dL   BUN 16 8 - 23 mg/dL   Creatinine, Ser 0.83 0.61 - 1.24 mg/dL   Calcium 10.1 8.9 - 10.3 mg/dL   Total Protein 8.4 (H) 6.5 - 8.1 g/dL   Albumin 4.5 3.5 - 5.0 g/dL   AST 70 (H) 15 - 41 U/L   ALT 18 0 - 44 U/L   Alkaline Phosphatase 74 38 - 126 U/L   Total Bilirubin 2.6 (H) 0.3 - 1.2 mg/dL   GFR calc non Af Amer >60 >60 mL/min   GFR calc Af Amer >60 >60 mL/min   Anion gap 8  5 - 15    Comment: Performed at [facility], [place] [address], [place], [place] [address]  CK     Status: Abnormal   Collection Time: 02/01/19 10:37 PM  Result Value Ref Range   Total CK 2,852 (H) 49 - 397 U/L    Comment: Performed at [facility], [place] [address] [place], [place] [address]   Ct Head Wo Contrast  Addendum Date: 02/01/2019   ADDENDUM REPORT: 02/01/2019 23:10 ADDENDUM: Critical Value/emergent results were called by telephone at the time of interpretation on 02/01/2019 at 2303 hours to [facility] , who verbally acknowledged these results. Electronically Signed   By: Genevie Ann M.D.   On: 02/01/2019 23:10   Result Date: 02/01/2019 CLINICAL DATA:  85 year old male status post unwitnessed fall. EXAM: CT HEAD WITHOUT CONTRAST CT CERVICAL SPINE WITHOUT CONTRAST TECHNIQUE: Multidetector CT imaging of the head and cervical spine was performed following the standard protocol without intravenous contrast. Multiplanar CT image reconstructions of the cervical spine were also generated. COMPARISON:  None. FINDINGS: CT HEAD FINDINGS Brain: Mixed density hemorrhage in the right hemisphere is extra-axial and lobulated but probably subdural. Hyperdense septations or developing thrombus within the lobulated but contiguous collection (sagittal image 16) which measures up to 18-20 millimeters in thickness (series 3, image 19 and coronal image 51). With associated trace leftward midline shift and overall mild mass effect on the right hemisphere. Normal basilar cisterns. No other intracranial hemorrhage identified. Patchy bilateral cerebral white matter hypodensity. No ventriculomegaly. Nonspecific dystrophic calcifications in the pons. No ventriculomegalyor evidence of cortically based acute infarction. No cortical encephalomalacia identified. Vascular: Calcified atherosclerosis at the skull base. No suspicious intracranial vascular hyperdensity. Skull: No skull fracture  identified. Sinuses/Orbits: Trace paranasal sinus mucosal thickening. Tympanic cavities and mastoids are clear. Other: Negative orbits. No scalp hematoma identified. CT CERVICAL SPINE FINDINGS Alignment: Straightening of cervical lordosis. Cervicothoracic junction alignment is within normal limits. Bilateral posterior element alignment is within normal limits. Skull base and vertebrae: Visualized skull base is intact. No atlanto-occipital dissociation. No acute osseous abnormality identified. Soft tissues and spinal canal: No prevertebral fluid or swelling. No visible canal hematoma. Negative noncontrast neck soft tissues. Disc levels:  Mild for age cervical spine degeneration. Upper chest: Visible upper thoracic levels appear intact. Negative lung apices and negative noncontrast thoracic inlet. IMPRESSION: 1. Mixed density and lobulated right side subdural hematoma. This is  age indeterminate and measures up to 18-20 mm in thickness. Associated mild mass effect on the right hemisphere with trace leftward midline shift. No associated skull fracture identified. 2. Nonspecific cerebral white matter changes, most commonly due to small vessel disease. 3.  No acute traumatic injury identified in the cervical spine. Electronically Signed: By: Genevie Ann M.D. On: 02/01/2019 22:57   Ct Cervical Spine Wo Contrast  Addendum Date: 02/01/2019   ADDENDUM REPORT: 02/01/2019 23:10 ADDENDUM: Critical Value/emergent results were called by telephone at the time of interpretation on 02/01/2019 at 2303 hours to [facility] , who verbally acknowledged these results. Electronically Signed   By: Genevie Ann M.D.   On: 02/01/2019 23:10   Result Date: 02/01/2019 CLINICAL DATA:  85 year old male status post unwitnessed fall. EXAM: CT HEAD WITHOUT CONTRAST CT CERVICAL SPINE WITHOUT CONTRAST TECHNIQUE: Multidetector CT imaging of the head and cervical spine was performed following the standard protocol without intravenous contrast. Multiplanar  CT image reconstructions of the cervical spine were also generated. COMPARISON:  None. FINDINGS: CT HEAD FINDINGS Brain: Mixed density hemorrhage in the right hemisphere is extra-axial and lobulated but probably subdural. Hyperdense septations or developing thrombus within the lobulated but contiguous collection (sagittal image 16) which measures up to 18-20 millimeters in thickness (series 3, image 19 and coronal image 51). With associated trace leftward midline shift and overall mild mass effect on the right hemisphere. Normal basilar cisterns. No other intracranial hemorrhage identified. Patchy bilateral cerebral white matter hypodensity. No ventriculomegaly. Nonspecific dystrophic calcifications in the pons. No ventriculomegalyor evidence of cortically based acute infarction. No cortical encephalomalacia identified. Vascular: Calcified atherosclerosis at the skull base. No suspicious intracranial vascular hyperdensity. Skull: No skull fracture identified. Sinuses/Orbits: Trace paranasal sinus mucosal thickening. Tympanic cavities and mastoids are clear. Other: Negative orbits. No scalp hematoma identified. CT CERVICAL SPINE FINDINGS Alignment: Straightening of cervical lordosis. Cervicothoracic junction alignment is within normal limits. Bilateral posterior element alignment is within normal limits. Skull base and vertebrae: Visualized skull base is intact. No atlanto-occipital dissociation. No acute osseous abnormality identified. Soft tissues and spinal canal: No prevertebral fluid or swelling. No visible canal hematoma. Negative noncontrast neck soft tissues. Disc levels:  Mild for age cervical spine degeneration. Upper chest: Visible upper thoracic levels appear intact. Negative lung apices and negative noncontrast thoracic inlet. IMPRESSION: 1. Mixed density and lobulated right side subdural hematoma. This is age indeterminate and measures up to 18-20 mm in thickness. Associated mild mass effect on the  right hemisphere with trace leftward midline shift. No associated skull fracture identified. 2. Nonspecific cerebral white matter changes, most commonly due to small vessel disease. 3.  No acute traumatic injury identified in the cervical spine. Electronically Signed: By: Genevie Ann M.D. On: 02/01/2019 22:57    Pending Labs Unresulted Labs (From admission, onward)    Start     Ordered   02/02/19 0500  Hemoglobin A1c  Tomorrow morning,   R    Comments:  To assess prior glycemic control    02/02/19 0018   02/02/19 0500  Hepatic function panel  Tomorrow morning,   R     02/02/19 0018   02/02/19 0500  CK  Tomorrow morning,   R     02/02/19 0018   02/02/19 0038  Ethanol  Add-on,   R     02/02/19 0037   02/02/19 0036  Urinalysis, Routine w reflex microscopic  Once,   R     02/02/19 0035  Vitals/Pain Today's Vitals   02/01/19 2015 02/01/19 2100 02/01/19 2200 02/02/19 0000  BP:  128/77 118/68 116/90  Pulse:  73 71 68  Resp:  18 16 13   SpO2:  98% 98% 97%  Height: 5\' 11"  (1.803 m)     PainSc: 0-No pain       Isolation Precautions No active isolations  Medications Medications  acetaminophen (TYLENOL) tablet 650 mg (has no administration in time range)    Or  acetaminophen (TYLENOL) suppository 650 mg (has no administration in time range)  sodium chloride 0.9 % bolus 500 mL (has no administration in time range)  0.9 %  sodium chloride infusion (has no administration in time range)  insulin aspart (novoLOG) injection 0-9 Units (has no administration in time range)  LORazepam (ATIVAN) tablet 1 mg (has no administration in time range)    Or  LORazepam (ATIVAN) injection 1 mg (has no administration in time range)  thiamine (VITAMIN B-1) tablet 100 mg (has no administration in time range)    Or  thiamine (B-1) injection 100 mg (has no administration in time range)  folic acid (FOLVITE) tablet 1 mg (has no administration in time range)  multivitamin with minerals tablet 1  tablet (has no administration in time range)  sodium chloride 0.9 % bolus 500 mL (500 mLs Intravenous New Bag/Given 02/01/19 2332)    Mobility walks High fall risk   Focused Assessments Neuro Assessment Handoff:  Swallow screen pass? N/A         Neuro Assessment: Exceptions to WDL Neuro Checks:      Last Documented NIHSS Modified Score:   Has TPA been given? No If patient is a Neuro Trauma and patient is going to OR before floor call report to [place] nurse: [phone] or [phone]     R Recommendations: See Admitting Provider Note  Report given to:   Additional Notes:

## 2019-02-02 NOTE — Consult Note (Addendum)
Reason for Consult: SDH Referring Physician:  EDP  AKITO BOOMHOWER is an 85 y.o. male.   HPI:  85 year old patient presented to the ED tonight after sustaining a fall this morning. Sister at bedside reports that he has had multiple falls over the last couple months and increased leg weakness. He is unsteady on his feet. Denies any headaches, NV or vision changes. HE does live at home alone and denies any LOC at time of fall. Patient states multiple times he does not want to have surgery on his head.   Past Medical History:  Diagnosis Date  . Cancer ([place])    colon  . Diabetes mellitus without complication [facility])     Past Surgical History:  Procedure Laterality Date  . COLON SURGERY      Allergies  Allergen Reactions  . Sulfa Antibiotics     Sulfa containing drugs     Social History   Tobacco Use  . Smoking status: Former Research scientist (life sciences)  . Smokeless tobacco: Never Used  Substance Use Topics  . Alcohol use: Yes    No family history on file.   Review of Systems  Positive ROS: as above  All other systems have been reviewed and were otherwise negative with the exception of those mentioned in the HPI and as above.  Objective: Vital signs in last 24 hours: Pulse Rate:  [68-73] 68 (03/10 0000) Resp:  [13-18] 13 (03/10 0000) BP: (116-128)/(68-90) 116/90 (03/10 0000) SpO2:  [96 %-98 %] 97 % (03/10 0000)  General Appearance: Alert, cooperative, no distress, appears stated age Head: Normocephalic, without obvious abnormality, atraumatic Eyes: PERRL, conjunctiva/corneas clear, EOM's intact, fundi benign, both eyes      Lungs:  respirations unlabored Heart: Regular rate and rhythm Skin: Skin color, texture, turgor normal, no rashes or lesions  NEUROLOGIC:   Mental status: A&O x4, no aphasia, good attention span, Memory and fund of knowledge Motor Exam - grossly normal, normal tone and bulk Sensory Exam - grossly normal Reflexes: symmetric, no pathologic reflexes, No Hoffman's, No  clonus Coordination - grossly normal Gait - unable to test Balance - unable to test Cranial Nerves: I: smell Not tested  II: visual acuity  OS: na  OD: na  II: visual fields Full to confrontation  II: pupils Equal, round, reactive to light  III,VII: ptosis None  III,IV,VI: extraocular muscles  Full ROM  V: mastication Normal  V: facial light touch sensation  Normal  V,VII: corneal reflex  Present  VII: facial muscle function - upper  Normal  VII: facial muscle function - lower Normal  VIII: hearing Not tested  IX: soft palate elevation  Normal  IX,X: gag reflex Present  XI: trapezius strength  5/5  XI: sternocleidomastoid strength 5/5  XI: neck flexion strength  5/5  XII: tongue strength  Normal    Data Review Lab Results  Component Value Date   WBC 10.2 02/01/2019   HGB 14.7 02/01/2019   HCT 43.5 02/01/2019   MCV 93.8 02/01/2019   PLT 243 02/01/2019   Lab Results  Component Value Date   NA 139 02/01/2019   K 5.1 02/01/2019   CL 107 02/01/2019   CO2 24 02/01/2019   BUN 16 02/01/2019   CREATININE 0.83 02/01/2019   GLUCOSE 119 (H) 02/01/2019   No results found for: INR, PROTIME  Radiology: Ct Head Wo Contrast  Addendum Date: 02/01/2019   ADDENDUM REPORT: 02/01/2019 23:10 ADDENDUM: Critical Value/emergent results were called by telephone at the time  of interpretation on 02/01/2019 at 2303 hours to [facility] , who verbally acknowledged these results. Electronically Signed   By: Genevie Ann M.D.   On: 02/01/2019 23:10   Result Date: 02/01/2019 CLINICAL DATA:  85 year old male status post unwitnessed fall. EXAM: CT HEAD WITHOUT CONTRAST CT CERVICAL SPINE WITHOUT CONTRAST TECHNIQUE: Multidetector CT imaging of the head and cervical spine was performed following the standard protocol without intravenous contrast. Multiplanar CT image reconstructions of the cervical spine were also generated. COMPARISON:  None. FINDINGS: CT HEAD FINDINGS Brain: Mixed density hemorrhage in the  right hemisphere is extra-axial and lobulated but probably subdural. Hyperdense septations or developing thrombus within the lobulated but contiguous collection (sagittal image 16) which measures up to 18-20 millimeters in thickness (series 3, image 19 and coronal image 51). With associated trace leftward midline shift and overall mild mass effect on the right hemisphere. Normal basilar cisterns. No other intracranial hemorrhage identified. Patchy bilateral cerebral white matter hypodensity. No ventriculomegaly. Nonspecific dystrophic calcifications in the pons. No ventriculomegalyor evidence of cortically based acute infarction. No cortical encephalomalacia identified. Vascular: Calcified atherosclerosis at the skull base. No suspicious intracranial vascular hyperdensity. Skull: No skull fracture identified. Sinuses/Orbits: Trace paranasal sinus mucosal thickening. Tympanic cavities and mastoids are clear. Other: Negative orbits. No scalp hematoma identified. CT CERVICAL SPINE FINDINGS Alignment: Straightening of cervical lordosis. Cervicothoracic junction alignment is within normal limits. Bilateral posterior element alignment is within normal limits. Skull base and vertebrae: Visualized skull base is intact. No atlanto-occipital dissociation. No acute osseous abnormality identified. Soft tissues and spinal canal: No prevertebral fluid or swelling. No visible canal hematoma. Negative noncontrast neck soft tissues. Disc levels:  Mild for age cervical spine degeneration. Upper chest: Visible upper thoracic levels appear intact. Negative lung apices and negative noncontrast thoracic inlet. IMPRESSION: 1. Mixed density and lobulated right side subdural hematoma. This is age indeterminate and measures up to 18-20 mm in thickness. Associated mild mass effect on the right hemisphere with trace leftward midline shift. No associated skull fracture identified. 2. Nonspecific cerebral white matter changes, most commonly due  to small vessel disease. 3.  No acute traumatic injury identified in the cervical spine. Electronically Signed: By: Genevie Ann M.D. On: 02/01/2019 22:57   Ct Cervical Spine Wo Contrast  Addendum Date: 02/01/2019   ADDENDUM REPORT: 02/01/2019 23:10 ADDENDUM: Critical Value/emergent results were called by telephone at the time of interpretation on 02/01/2019 at 2303 hours to [facility] , who verbally acknowledged these results. Electronically Signed   By: Genevie Ann M.D.   On: 02/01/2019 23:10   Result Date: 02/01/2019 CLINICAL DATA:  85 year old male status post unwitnessed fall. EXAM: CT HEAD WITHOUT CONTRAST CT CERVICAL SPINE WITHOUT CONTRAST TECHNIQUE: Multidetector CT imaging of the head and cervical spine was performed following the standard protocol without intravenous contrast. Multiplanar CT image reconstructions of the cervical spine were also generated. COMPARISON:  None. FINDINGS: CT HEAD FINDINGS Brain: Mixed density hemorrhage in the right hemisphere is extra-axial and lobulated but probably subdural. Hyperdense septations or developing thrombus within the lobulated but contiguous collection (sagittal image 16) which measures up to 18-20 millimeters in thickness (series 3, image 19 and coronal image 51). With associated trace leftward midline shift and overall mild mass effect on the right hemisphere. Normal basilar cisterns. No other intracranial hemorrhage identified. Patchy bilateral cerebral white matter hypodensity. No ventriculomegaly. Nonspecific dystrophic calcifications in the pons. No ventriculomegalyor evidence of cortically based acute infarction. No cortical encephalomalacia identified. Vascular: Calcified atherosclerosis at the  skull base. No suspicious intracranial vascular hyperdensity. Skull: No skull fracture identified. Sinuses/Orbits: Trace paranasal sinus mucosal thickening. Tympanic cavities and mastoids are clear. Other: Negative orbits. No scalp hematoma identified. CT CERVICAL  SPINE FINDINGS Alignment: Straightening of cervical lordosis. Cervicothoracic junction alignment is within normal limits. Bilateral posterior element alignment is within normal limits. Skull base and vertebrae: Visualized skull base is intact. No atlanto-occipital dissociation. No acute osseous abnormality identified. Soft tissues and spinal canal: No prevertebral fluid or swelling. No visible canal hematoma. Negative noncontrast neck soft tissues. Disc levels:  Mild for age cervical spine degeneration. Upper chest: Visible upper thoracic levels appear intact. Negative lung apices and negative noncontrast thoracic inlet. IMPRESSION: 1. Mixed density and lobulated right side subdural hematoma. This is age indeterminate and measures up to 18-20 mm in thickness. Associated mild mass effect on the right hemisphere with trace leftward midline shift. No associated skull fracture identified. 2. Nonspecific cerebral white matter changes, most commonly due to small vessel disease. 3.  No acute traumatic injury identified in the cervical spine. Electronically Signed: By: Genevie Ann M.D. On: 02/01/2019 22:57     Assessment/Plan: 85 year old status post fall this morning with multiple falls over the last couple months. CT head shows a large right lobulated SDH with mixed density blood measuring 40mm thick. Patient is neuro intact but does have some trouble with memory upon assessment in the exam room. We will have him admitted to medicine service at [facility] and repeat head CT in the morning. Please hold all blood thinners.    Ocie Cornfield Meyran 02/02/2019 12:23 AM   Agree with above.  I do not think this warrants surgery at this time and patient is not interested in surgery anyway.  PT OT/ST

## 2019-02-02 NOTE — Progress Notes (Signed)
EEG Completed; Results Pending  

## 2019-02-02 NOTE — Procedures (Signed)
ELECTROENCEPHALOGRAM REPORT   Patient: Keith Pittman       Room #: 6W10X EEG No. ID: [id number] Age: 85 y.o.        Sex: male Referring Physician: Erlinda Hong Report Date:  02/02/2019        Interpreting Physician: Alexis Goodell  History: Keith Pittman is an 85 y.o. male s/p an unwitnessed fall  Medications:  Folvite, Insulin, MVI, Thiamine  Conditions of Recording:  This is a 21 channel routine scalp EEG performed with bipolar and monopolar montages arranged in accordance to the international 10/20 system of electrode placement. One channel was dedicated to EKG recording.  The patient is in the awake and drowsy states.  Description:  The waking background activity consists of a low voltage, symmetrical, fairly well organized, 8 Hz alpha activity, seen from the parieto-occipital and posterior temporal regions.  Low voltage fast activity, poorly organized, is seen anteriorly and is at times superimposed on more posterior regions.  A mixture of theta and alpha rhythms are seen from the central and temporal regions. The patient drowses briefly with slowing to irregular, low voltage theta and beta activity.   Stage II sleep is not obtained. No epileptiform activity is noted.   Hyperventilation and intermittent photic stimulation were not performed.   IMPRESSION: Normal electroencephalogram, awake and drowsy. There are no focal lateralizing or epileptiform features.   Alexis Goodell, MD Neurology [phone] 02/02/2019, 1:33 PM

## 2019-02-02 NOTE — Evaluation (Addendum)
Occupational Therapy Evaluation Patient Details Name: Keith Pittman MRN: [id number] DOB: 1932-08-16 Today's Date: 02/02/2019    History of Present Illness Pt is a 85 y/o male with PMH of colon cancer, type 2 diabetes presenting to hospital for evaluation after unwitnessed fall at home. Pt unable to recall how he fell, found in bedroom floor conscious. CT reveals: mixed density and lobulated right-sided subdural hematoma, age indeterminate, measuring up to 18 to 20 mm in thickness with associated mild mass-effect on the right hemisphere with trace leftward midline shift.    Clinical Impression   PTA patient reports living alone and completing self care, IADLs and mobility independently (does not drive and reports "I don't take any medications").  Patient admitted for above, limited by problem list below including: impaired cognition (orientation--date and situation)awareness to deficits, following multi-step commands, decreased attention, and impaired memory), L sided weakness, impaired balance, and decreased activity tolerance.  BP monitored during session: 114/64 supine, 120/70 EOB and 116/51 standing.  Pt able to complete UB ADLs with min assist, LB ADLs with mod- max assist +2 if standing, completes basic transfers with mod assist +2.  Uses RW for mobility and requires constant cueing for walker mgmt and safety, L sided dragging behind.  Patient reports no support at home 24/7 and based on performance today, will benefit from continued OT services while admitted and further OT at SNF rehab in order to optimize independence and safety with ADLs/mobility.  Will follow.     Follow Up Recommendations  SNF;Supervision/Assistance - 24 hour    Equipment Recommendations  3 in 1 bedside commode    Recommendations for Other Services       Precautions / Restrictions Precautions Precautions: Fall Restrictions Weight Bearing Restrictions: No      Mobility Bed Mobility Overal bed mobility: Needs  Assistance Bed Mobility: Rolling;Sidelying to Sit Rolling: Supervision Sidelying to sit: Min assist       General bed mobility comments: min assist to ascend trunk to sitting EOB  Transfers Overall transfer level: Needs assistance Equipment used: Rolling walker (2 wheeled);None Transfers: Sit to/from Stand Sit to Stand: Mod assist;+2 physical assistance;+2 safety/equipment         General transfer comment: attempted sit to stand without walker, unable stand fully upright; using rolling walker +2 mod assist to ascend in standing with cueing for hand placement, sequencing and coordination    Balance Overall balance assessment: Needs assistance Sitting-balance support: Feet supported;No upper extremity supported Sitting balance-Leahy Scale: Fair Sitting balance - Comments: poor dynmaic balance, unable to don socks   Standing balance support: Bilateral upper extremity supported;During functional activity Standing balance-Leahy Scale: Poor Standing balance comment: relaint on BUE support                           ADL either performed or assessed with clinical judgement   ADL Overall ADL's : Needs assistance/impaired Eating/Feeding: Set up;Sitting   Grooming: Min guard;Standing Grooming Details (indicate cue type and reason): min guard with hand washing, cueing to use soap  Upper Body Bathing: Set up;Sitting   Lower Body Bathing: Moderate assistance;Sit to/from stand Lower Body Bathing Details (indicate cue type and reason): assist to stand, and unable to reach feet Upper Body Dressing : Sitting;Minimal assistance   Lower Body Dressing: Maximal assistance;+2 for physical assistance;Sit to/from stand Lower Body Dressing Details (indicate cue type and reason): +2 in standing, requires assist to reach feet and don socks Toilet Transfer: Moderate assistance;+2  for physical assistance;+2 for safety/equipment;Ambulation;RW Toilet Transfer Details (indicate cue type and  reason): simulated to recliner  Toileting- Clothing Manipulation and Hygiene: Maximal assistance;Sit to/from stand;+2 for physical assistance;+2 for safety/equipment Toileting - Clothing Manipulation Details (indicate cue type and reason): requires assist for hygiene after incontinent of urine in bed     Functional mobility during ADLs: Minimal assistance;Moderate assistance;+2 for physical assistance;+2 for safety/equipment;Rolling walker General ADL Comments: pt limited by generalized weakness, L sided weakness, cogntion and balance      Vision Baseline Vision/History: Wears glasses Wears Glasses: Reading only Patient Visual Report: No change from baseline Additional Comments: further assesment required     Perception     Praxis      Pertinent Vitals/Pain Pain Assessment: Faces Faces Pain Scale: Hurts a little bit Pain Location: abdomen, with bed mobility  Pain Descriptors / Indicators: Discomfort Pain Intervention(s): Monitored during session;Repositioned     Hand Dominance Right   Extremity/Trunk Assessment Upper Extremity Assessment Upper Extremity Assessment: LUE deficits/detail LUE Deficits / Details: appears grossly weaker than R UE, 3+/5 with limited shoulder AROM.   LUE Sensation: WNL LUE Coordination: WNL   Lower Extremity Assessment Lower Extremity Assessment: Defer to PT evaluation       Communication Communication Communication: No difficulties   Cognition Arousal/Alertness: Awake/alert Behavior During Therapy: Flat affect Overall Cognitive Status: No family/caregiver present to determine baseline cognitive functioning Area of Impairment: Attention;Orientation;Memory;Safety/judgement;Following commands;Awareness;Problem solving                 Orientation Level: Disoriented to;Situation;Time Current Attention Level: Sustained Memory: Decreased short-term memory;Decreased recall of precautions Following Commands: Follows one step commands  consistently;Follows one step commands with increased time Safety/Judgement: Decreased awareness of safety;Decreased awareness of deficits Awareness: Emergent Problem Solving: Slow processing;Decreased initiation;Difficulty sequencing;Requires verbal cues;Requires tactile cues General Comments: Pt oriented to self and place, unaware of situation and denies falling at home.  Poor awareness to deficits and need for assistance, but able to follow 1 step commands with increased time.    General Comments       Exercises     Shoulder Instructions      Home Living Family/patient expects to be discharged to:: Private residence Living Arrangements: Alone Available Help at Discharge: Family;Available PRN/intermittently Type of Home: House Home Access: Level entry     Home Layout: One level     Bathroom Shower/Tub: Teacher, early years/pre: Standard     Home Equipment: None          Prior Functioning/Environment Level of Independence: Independent        Comments: independent ADLs, IADLs, not driving--reports family checks in on him daily         OT Problem List: Decreased strength;Decreased activity tolerance;Impaired balance (sitting and/or standing);Decreased range of motion;Decreased cognition;Decreased safety awareness;Decreased knowledge of precautions;Decreased knowledge of use of DME or AE      OT Treatment/Interventions: Self-care/ADL training;Therapeutic exercise;DME and/or AE instruction;Therapeutic activities;Cognitive remediation/compensation;Balance training;Patient/family education    OT Goals(Current goals can be found in the care plan section) Acute Rehab OT Goals Patient Stated Goal: none stated OT Goal Formulation: With patient Time For Goal Achievement: 02/16/19 Potential to Achieve Goals: Good  OT Frequency: Min 2X/week   Barriers to D/C:            Co-evaluation PT/OT/SLP Co-Evaluation/Treatment: Yes Reason for Co-Treatment: To address  functional/ADL transfers;For patient/therapist safety   OT goals addressed during session: ADL's and self-care      AM-PAC OT "6  Clicks" Daily Activity     Outcome Measure Help from another person eating meals?: A Little Help from another person taking care of personal grooming?: A Little Help from another person toileting, which includes using toliet, bedpan, or urinal?: Total Help from another person bathing (including washing, rinsing, drying)?: A Lot Help from another person to put on and taking off regular upper body clothing?: A Little Help from another person to put on and taking off regular lower body clothing?: A Lot 6 Click Score: 14   End of Session Equipment Utilized During Treatment: Gait belt;Rolling walker Nurse Communication: Mobility status  Activity Tolerance: Patient tolerated treatment well Patient left: in chair;with call bell/phone within reach;with nursing/sitter in room;with chair alarm set  OT Visit Diagnosis: Other abnormalities of gait and mobility (R26.89);Muscle weakness (generalized) (M62.81);Other symptoms and signs involving cognitive function                Time: 4801-6553 OT Time Calculation (min): 39 min Charges:  OT General Charges $OT Visit: 1 Visit OT Evaluation $OT Eval Moderate Complexity: 1 Mod OT Treatments $Self Care/Home Management : 8-22 mins  Delight Stare, [place] Pager [phone] Office [phone]   Delight Stare 02/02/2019, 12:30 PM

## 2019-02-02 NOTE — TOC Initial Note (Signed)
Transition of Care [facility]) - Initial/Assessment Note    Patient Details  Name: Keith Pittman MRN: [id number] Date of Birth: 06-Jan-1932  Transition of Care [facility]) CM/SW Contact:    Geralynn Ochs, LCSW Phone Number: 02/02/2019, 4:58 PM  Clinical Narrative:  Patient agreeable to thinking about SNF placement, although he's not happy about it and wants to be able to go home. CSW discussed brief SNF placement to prevent further health issues in the future; patient said he'd think about it. He gave permission to fax out referral and follow up with him tomorrow.                 Expected Discharge Plan: Skilled Nursing Facility Barriers to Discharge: Insurance Authorization, SNF Pending bed offer   Patient Goals and CMS Choice Patient states their goals for this hospitalization and ongoing recovery are:: go home on my own CMS Medicare.gov Compare Post Acute Care list provided to:: Patient Represenative (must comment)(patient wanted his sister to review) Choice offered to / list presented to : Patient, [facility] POA / Guardian  Expected Discharge Plan and Services Expected Discharge Plan: [place] Acute Care Choice: [place] Living arrangements for the past 2 months: Single Family Home                          Prior Living Arrangements/Services Living arrangements for the past 2 months: Single Family Home Lives with:: Self Patient language and need for interpreter reviewed:: No Do you feel safe going back to the place where you live?: Yes      Need for Family Participation in Patient Care: No (Comment) Care giver support system in place?: No (comment)   Criminal Activity/Legal Involvement Pertinent to Current Situation/Hospitalization: No - Comment as needed  Activities of Daily Living Home Assistive Devices/Equipment: Wheelchair, Environmental consultant (specify type) ADL Screening (condition at time of admission) Patient's cognitive ability adequate to safely  complete daily activities?: No Is the patient deaf or have difficulty hearing?: No Does the patient have difficulty seeing, even when wearing glasses/contacts?: No Does the patient have difficulty concentrating, remembering, or making decisions?: No Patient able to express need for assistance with ADLs?: Yes Does the patient have difficulty dressing or bathing?: No Independently performs ADLs?: No Communication: Independent Dressing (OT): Needs assistance Is this a change from baseline?: Pre-admission baseline Grooming: Independent Feeding: Independent Bathing: Independent Toileting: Independent In/Out Bed: Dependent Is this a change from baseline?: Pre-admission baseline Walks in Home: Dependent Is this a change from baseline?: Pre-admission baseline Does the patient have difficulty walking or climbing stairs?: Yes Weakness of Legs: Both Weakness of Arms/Hands: None  Permission Sought/Granted Permission sought to share information with : Facility Sport and exercise psychologist, Family Supports Permission granted to share information with : Yes, Verbal Permission Granted  Share Information with NAME: [name]  Permission granted to share info w AGENCY: SNF  Permission granted to share info w Relationship: Sister/HCPOA     Emotional Assessment Appearance:: Appears stated age Attitude/Demeanor/Rapport: Engaged Affect (typically observed): Pleasant Orientation: : Oriented to Self, Oriented to Place, Oriented to  Time, Oriented to Situation Alcohol / Substance Use: Alcohol Use Psych Involvement: No (comment)  Admission diagnosis:  Subdural hematoma ([place]) [S06.5X9A] Total bilirubin, elevated [R17] Patient Active Problem List   Diagnosis Date Noted  . Unwitnessed fall 02/02/2019  . Rhabdomyolysis 02/02/2019  . Elevated LFTs 02/02/2019  . Alcohol use 02/02/2019  . Subdural hematoma ([place]) 02/01/2019   PCP:  Lavone Orn, MD Pharmacy:   [id number] - [place], [place] [address] Phone: [phone] Fax: [phone]     Social Determinants of Health (SDOH) Interventions    Readmission Risk Interventions  No flowsheet data found.

## 2019-02-02 NOTE — NC FL2 (Signed)
[place] MEDICAID FL2 LEVEL OF CARE SCREENING TOOL     IDENTIFICATION  Patient Name: Keith Pittman Birthdate: November 18, 1932 Sex: male Admission Date (Current Location): 02/01/2019  [facility] and [place] Number:  Herbalist and Address:  [facility], [place] [address], [place], [place] [address]      Provider Number: [id number]  Attending Physician Name and Address:  Florencia Reasons, MD  Relative Name and Phone Number:       Current Level of Care: Hospital Recommended Level of Care: [place] Prior Approval Number:    Date Approved/Denied:   PASRR Number: [id number]  Discharge Plan: SNF    Current Diagnoses: Patient Active Problem List   Diagnosis Date Noted  . Unwitnessed fall 02/02/2019  . Rhabdomyolysis 02/02/2019  . Elevated LFTs 02/02/2019  . Alcohol use 02/02/2019  . Subdural hematoma ([place]) 02/01/2019    Orientation RESPIRATION BLADDER Height & Weight     Self, Time, Situation, Place  Normal Incontinent Weight:   Height:  5\' 11"  (180.3 cm)  BEHAVIORAL SYMPTOMS/MOOD NEUROLOGICAL BOWEL NUTRITION STATUS      Continent Diet(see DC summary)  AMBULATORY STATUS COMMUNICATION OF NEEDS Skin   Extensive Assist Verbally Normal                       Personal Care Assistance Level of Assistance  Bathing, Feeding, Dressing Bathing Assistance: Limited assistance Feeding assistance: Independent Dressing Assistance: Limited assistance     Functional Limitations Info  Sight, Hearing, Speech Sight Info: Adequate Hearing Info: Adequate Speech Info: Adequate    SPECIAL CARE FACTORS FREQUENCY  PT (By licensed PT), OT (By licensed OT)     PT Frequency: 5x/wk OT Frequency: 5x/wk            Contractures Contractures Info: Not present    Additional Factors Info  Code Status, Allergies, Insulin Sliding Scale Code Status Info: Full Allergies Info: Sulfa Antibiotics   Insulin Sliding Scale Info: 0-9 units 3x/day with  meals       Current Medications (02/02/2019):  This is the current hospital active medication list Current Facility-Administered Medications  Medication Dose Route Frequency Provider Last Rate Last Dose  . acetaminophen (TYLENOL) tablet 650 mg  650 mg Oral Q6H PRN Shela Leff, MD   650 mg at 02/02/19 0420   Or  . acetaminophen (TYLENOL) suppository 650 mg  650 mg Rectal Q6H PRN Shela Leff, MD      . folic acid (FOLVITE) tablet 1 mg  1 mg Oral Daily Shela Leff, MD   1 mg at 02/02/19 1037  . insulin aspart (novoLOG) injection 0-9 Units  0-9 Units Subcutaneous TID WC Shela Leff, MD      . lactated ringers infusion   Intravenous Continuous Florencia Reasons, MD 75 mL/hr at 02/02/19 1453    . LORazepam (ATIVAN) tablet 1 mg  1 mg Oral Q6H PRN Shela Leff, MD       Or  . LORazepam (ATIVAN) injection 1 mg  1 mg Intravenous Q6H PRN Shela Leff, MD      . multivitamin with minerals tablet 1 tablet  1 tablet Oral Daily Shela Leff, MD   1 tablet at 02/02/19 1116  . sodium chloride 0.9 % bolus 500 mL  500 mL Intravenous Once Shela Leff, MD   Stopped at 02/02/19 [id number]  . thiamine (VITAMIN B-1) tablet 100 mg  100 mg Oral Daily Shela Leff, MD   100 mg at 02/02/19  1117   Or  . thiamine (B-1) injection 100 mg  100 mg Intravenous Daily Shela Leff, MD         Discharge Medications: Please see discharge summary for a list of discharge medications.  Relevant Imaging Results:  Relevant Lab Results:   Additional Information SS#: [id number]  Geralynn Ochs, LCSW

## 2019-02-02 NOTE — Progress Notes (Signed)
Carotid duplex has been completed.   Preliminary results in CV Proc.   Abram Sander 02/02/2019 1:38 PM

## 2019-02-02 NOTE — ED Notes (Signed)
Pt will not keep on Cardiac monitor. Put is pulling at wires and has removed all the EKG leads and stickers.

## 2019-02-02 NOTE — Progress Notes (Signed)
  Echocardiogram 2D Echocardiogram has been performed.  Keith Pittman 02/02/2019, 9:16 AM

## 2019-02-03 LAB — CBC WITH DIFFERENTIAL/PLATELET
Abs Immature Granulocytes: 0.06 10*3/uL (ref 0.00–0.07)
BASOS PCT: 0 %
Basophils Absolute: 0 10*3/uL (ref 0.0–0.1)
Eosinophils Absolute: 0.1 10*3/uL (ref 0.0–0.5)
Eosinophils Relative: 2 %
HCT: 38.5 % — ABNORMAL LOW (ref 39.0–52.0)
Hemoglobin: 12.8 g/dL — ABNORMAL LOW (ref 13.0–17.0)
Immature Granulocytes: 1 %
Lymphocytes Relative: 21 %
Lymphs Abs: 1.5 10*3/uL (ref 0.7–4.0)
MCH: 30.6 pg (ref 26.0–34.0)
MCHC: 33.2 g/dL (ref 30.0–36.0)
MCV: 92.1 fL (ref 80.0–100.0)
Monocytes Absolute: 0.4 10*3/uL (ref 0.1–1.0)
Monocytes Relative: 6 %
Neutro Abs: 5 10*3/uL (ref 1.7–7.7)
Neutrophils Relative %: 70 %
PLATELETS: 221 10*3/uL (ref 150–400)
RBC: 4.18 MIL/uL — ABNORMAL LOW (ref 4.22–5.81)
RDW: 13.2 % (ref 11.5–15.5)
WBC: 7.1 10*3/uL (ref 4.0–10.5)
nRBC: 0 % (ref 0.0–0.2)

## 2019-02-03 LAB — COMPREHENSIVE METABOLIC PANEL
ALBUMIN: 3.3 g/dL — AB (ref 3.5–5.0)
ALK PHOS: 66 U/L (ref 38–126)
ALT: 9 U/L (ref 0–44)
ANION GAP: 5 (ref 5–15)
AST: 38 U/L (ref 15–41)
BUN: 11 mg/dL (ref 8–23)
CALCIUM: 9.8 mg/dL (ref 8.9–10.3)
CO2: 27 mmol/L (ref 22–32)
Chloride: 109 mmol/L (ref 98–111)
Creatinine, Ser: 0.86 mg/dL (ref 0.61–1.24)
GFR calc Af Amer: >60 mL/min (ref >60)
GFR calc non Af Amer: >60 mL/min (ref >60)
Glucose, Bld: 100 mg/dL — ABNORMAL HIGH (ref 70–99)
Potassium: 3.4 mmol/L — ABNORMAL LOW (ref 3.5–5.1)
Sodium: 141 mmol/L (ref 135–145)
Total Bilirubin: 1.1 mg/dL (ref 0.3–1.2)
Total Protein: 6.4 g/dL — ABNORMAL LOW (ref 6.5–8.1)

## 2019-02-03 LAB — CK: Total CK: 737 U/L — ABNORMAL HIGH (ref 49–397)

## 2019-02-03 LAB — HEPATITIS PANEL, ACUTE
HCV Ab: 0.1 s/co ratio (ref 0.0–0.9)
Hep A IgM: NEGATIVE
Hep B C IgM: NEGATIVE
Hepatitis B Surface Ag: NEGATIVE

## 2019-02-03 LAB — HEMOGLOBIN A1C
Hgb A1c MFr Bld: 5.5 % (ref 4.8–5.6)
MEAN PLASMA GLUCOSE: 111.15 mg/dL

## 2019-02-03 LAB — GLUCOSE, CAPILLARY
Glucose-Capillary: 111 mg/dL — ABNORMAL HIGH (ref 70–99)
Glucose-Capillary: 122 mg/dL — ABNORMAL HIGH (ref 70–99)
Glucose-Capillary: 102 mg/dL — ABNORMAL HIGH (ref 70–99)
Glucose-Capillary: 118 mg/dL — ABNORMAL HIGH (ref 70–99)

## 2019-02-03 MED ORDER — DEXAMETHASONE SODIUM PHOSPHATE 4 MG/ML IJ SOLN
4.0000 mg | Freq: Four times a day (QID) | INTRAMUSCULAR | Status: DC
  Administered 2019-02-03 – 2019-02-05 (×7): 4 mg via INTRAVENOUS
  Filled 2019-02-03 (×8): qty 1

## 2019-02-03 MED ORDER — LACTATED RINGERS IV SOLN
INTRAVENOUS | Status: AC
  Administered 2019-02-03: 18:00:00 via INTRAVENOUS

## 2019-02-03 NOTE — Progress Notes (Signed)
Patient ID: Keith Pittman, male   DOB: 04-25-1932, 85 y.o.   MRN: [id number] I had extensive conversations with the patient and his 2 sisters explained that he has a reasonably moderate sized fluid collection the outside of his brain on the right it's not causing a lot of mass effect or midline shift is causing some local pressure effects the patient currently is on denying any headache he denies any nausea or vision difficulties numbness tingling or weakness and he again reiterates he doesn't want anybody "who doesn't know what they're doing" mucking around his head. So I extensively talked to his sisters and the patient about the natural history of these and I think it's okay to repeat a CT scan in the morning and then he can be discharged to a skilled nursing facility I certainly think there is a high risk of stroke bleeding and reaccumulation in a person his age and he seems to be reasonably asymptomatic although he do think he has maybe a slight left pronator drift from this. So certainly falls within a be a high risk in him.

## 2019-02-03 NOTE — Progress Notes (Signed)
Physical Therapy Treatment Patient Details Name: Keith Pittman MRN: [id number] DOB: Jun 14, 1932 Today's Date: 02/03/2019    History of Present Illness Pt is a 85 y/o male with PMH of colon cancer, type 2 diabetes presenting to hospital for evaluation after unwitnessed fall at home. Pt unable to recall how he fell, found in bedroom floor conscious. CT reveals: mixed density and lobulated right-sided subdural hematoma, age indeterminate, measuring up to 18 to 20 mm in thickness with associated mild mass-effect on the right hemisphere with trace leftward midline shift.     PT Comments    Pt making very slow progress towards achieving his current functional mobility goals. He continues to demonstrate modest instability with transfers and ambulation with RW, and requires physical assistance to maintain his balance with posterior-left lean. Pt would continue to benefit from skilled physical therapy services at this time while admitted and after d/c to address the below listed limitations in order to improve overall safety and independence with functional mobility.    Follow Up Recommendations  SNF;Supervision for mobility/OOB     Equipment Recommendations  Rolling walker with 5" wheels    Recommendations for Other Services       Precautions / Restrictions Precautions Precautions: Fall Restrictions Weight Bearing Restrictions: No    Mobility  Bed Mobility               General bed mobility comments: pt OOB in recliner chair upon arrival  Transfers Overall transfer level: Needs assistance Equipment used: Rolling walker (2 wheeled) Transfers: Sit to/from Stand Sit to Stand: Mod assist;+2 physical assistance;+2 safety/equipment         General transfer comment: increased time and effort, good technique, assist to power into standing from recliner chair x1  Ambulation/Gait Ambulation/Gait assistance: Mod assist;Min assist Gait Distance (Feet): 50 Feet Assistive device:  Rolling walker (2 wheeled) Gait Pattern/deviations: Step-to pattern;Step-through pattern;Decreased stance time - left;Trunk flexed;Decreased stride length Gait velocity: decreased   General Gait Details: pt with slow, modestly unsteady gait with forward flexed posture and left lean; initially Min A progressing to Mod A when fatigued. Cues for safety and RW management/proximity.    Stairs             Wheelchair Mobility    Modified Rankin (Stroke Patients Only) Modified Rankin (Stroke Patients Only) Pre-Morbid Rankin Score: Slight disability Modified Rankin: Moderately severe disability     Balance Overall balance assessment: Needs assistance;History of Falls Sitting-balance support: Feet supported;No upper extremity supported Sitting balance-Leahy Scale: Fair     Standing balance support: During functional activity;Bilateral upper extremity supported Standing balance-Leahy Scale: Poor Standing balance comment: reliant on BUE support                            Cognition Arousal/Alertness: Awake/alert Behavior During Therapy: Impulsive;Flat affect Overall Cognitive Status: Impaired/Different from baseline Area of Impairment: Attention;Memory;Following commands;Safety/judgement;Awareness;Problem solving                   Current Attention Level: Sustained Memory: Decreased short-term memory Following Commands: Follows one step commands inconsistently;Follows one step commands with increased time Safety/Judgement: Decreased awareness of deficits;Decreased awareness of safety Awareness: Emergent Problem Solving: Slow processing;Decreased initiation;Difficulty sequencing;Requires verbal cues        Exercises      General Comments        Pertinent Vitals/Pain Pain Assessment: No/denies pain    Home Living  Prior Function            PT Goals (current goals can now be found in the care plan section) Acute Rehab  PT Goals PT Goal Formulation: With patient Time For Goal Achievement: 02/16/19 Potential to Achieve Goals: Good Progress towards PT goals: Progressing toward goals    Frequency    Min 3X/week      PT Plan Current plan remains appropriate    Co-evaluation              AM-PAC PT "6 Clicks" Mobility   Outcome Measure  Help needed turning from your back to your side while in a flat bed without using bedrails?: A Little Help needed moving from lying on your back to sitting on the side of a flat bed without using bedrails?: A Lot Help needed moving to and from a bed to a chair (including a wheelchair)?: A Lot Help needed standing up from a chair using your arms (e.g., wheelchair or bedside chair)?: A Lot Help needed to walk in hospital room?: A Lot Help needed climbing 3-5 steps with a railing? : Total 6 Click Score: 12    End of Session Equipment Utilized During Treatment: Gait belt Activity Tolerance: Patient tolerated treatment well Patient left: in chair;with call bell/phone within reach;with chair alarm set Nurse Communication: Mobility status PT Visit Diagnosis: Unsteadiness on feet (R26.81);Difficulty in walking, not elsewhere classified (R26.2)     Time: 1281-1886 PT Time Calculation (min) (ACUTE ONLY): 15 min  Charges:  $Gait Training: 8-22 mins                     Sherie Don, [place], DPT  Acute Rehabilitation Services Pager [phone] Office [place] 02/03/2019, 1:36 PM

## 2019-02-03 NOTE — Progress Notes (Signed)
PROGRESS NOTE    Keith Pittman  GXQ:[id number] DOB: Mar 29, 1932 DOA: 02/01/2019 PCP: Lavone Orn, MD   Brief narrative 85 year old male with history of colon cancer, T2 DM, who lives alone presented to the ER for evaluation of an unwitnessed fall.  Patient reports that he has had multiple falls in the past.  He lioves alone and his sister checks on the patient and brings food, and on 02/01/19 when sister called around dinnertime there was no response, and family arrived and patient was found facedown on his bed but he was conscious.  Patient reports that he had woken up, sat up and said " I blacked out" , details not very clear patient unable to describe more.  Patient was seen in the ER and CT scan of the head showed subdural hematoma, mild rhabdomyolysis of 2852 was admitted for further work-up.  Subjective: Sitting on the bedside chair, denies any new complaints.  Resting comfortably.  Denies any visual blurriness, headache, focal weakness numbness tingling.  Assessment & Plan:  Subdural hematoma: Seen by neurosurgery, no surgical intervention anticipated.  Patient is nonfocal, overall asymptomatic.  Repeat CT head 3/10 am "Increased size of mixed density RIGHT holo hemispheric subdural hematoma with membrane formation. Increased 6 mm LEFT mixed densitysubdural hematoma. No midline shift. 2. Moderate to severe chronic small vessel ischemic changes ". Continue PT/OT, awaiting SNF.  Neurosurgery is following.  Patient not  On antiplatelets or anticoagulants.  I have notified the neurosurgery service with Dr. Saintclair Halsted regarding the repeat CT head from yesterday and to see if they have any other recommendations.  Per neurosurgery patient did not want surgery anyway.  Unwitnessed fall/ ?syncope: With some beer drinking at home but denies daily use.Unclear etiology, orthostatic vitals stable, EEG carotid Dopplers, echocardiogram unremarkable.  Reported multiple falls at home.  Continue PT OT.  Work-up  with TTE: 60-65%, Carotid duplex: 40-59% rt carotid, left carotid- 1-39% stenosis. EEG neg for seizure.No fever and no leucocytosis.  Urine drug screen unremarkable. Cont pt/ot.  Monitor orthostatic vitals.  Rhabdomyolysis, CK 2852-improved in 700 with IVF. Cont oral hydration  Elevated LFTs: from rhabdo. Resolved. RUQ [place] negative.  Alcohol use: Advised cessation.  No signs of withdrawal.  Continue multivitamins folate and thiamine.   Type 2 diabetes: a1c stable. Controlled, blood sugar is stable.  It is diet controlled.  DVT prophylaxis: SCDs Code Status: Full code. Family Communication: No family at bedside. Disposition Plan: anticipating snf soon. Consults called: Neurosurgery    TTE:The left ventricle has normal systolic function with an ejection fraction of 60-65%. The cavity size was normal. There is mildly increased left ventricular wall thickness. Left ventricular diastolic Doppler parameters are consistent with impaired  relaxation.  2. The right ventricle has normal systolic function. The cavity was normal. There is no increase in right ventricular wall thickness.  3. The tricuspid valve is grossly normal.  4. The aortic valve is tricuspid Mild thickening of the aortic valve.  5. The pulmonic valve was not well visualized. Pulmonic valve regurgitation is mild by color flow Doppler.  6. Normal LV systolic function; mild LVH; mild diastolic dysfunction.  Antimicrobials: Anti-infectives (From admission, onward)   None       Objective: Vitals:   02/03/19 0500 02/03/19 0734 02/03/19 1200 02/03/19 1400  BP: 122/74 127/75  128/71  Pulse: (!) 58  75   Resp:  13  17  Temp: 97.8 F (36.6 C) 98.8 F (37.1 C)    TempSrc: Oral Axillary  SpO2: 98% 98%  98%  Weight:      Height:        Intake/Output Summary (Last 24 hours) at 02/03/2019 1422 Last data filed at 02/03/2019 1055 Gross per 24 hour  Intake 1563.56 ml  Output 750 ml  Net 813.56 ml   Filed Weights    02/03/19 0104  Weight: 94.1 kg   Weight change:   Body mass index is 28.93 kg/m.  Intake/Output from previous day: 03/10 0701 - 03/11 0700 In: 1625.9 [P.O.:240; I.V.:1385.9] Out: 850 [Urine:850] Intake/Output this shift: Total I/O In: 480 [P.O.:480] Out: -   Examination:  General exam: Appears calm and comfortable,Not in distress, older fore the age. HEENT:PERRL,Oral mucosa moist, Ear/Nose normal on gross exam Respiratory system: Bilateral equal air entry, normal vesicular breath sounds, no wheezes or crackles  Cardiovascular system: S1 & S2 heard,No JVD, murmurs. Gastrointestinal system: Abdomen is  soft, non tender, non distended, BS +  Nervous System:Alert and oriented. No focal neurological deficits/moving extremities, sensation intact. Extremities: No edema, no clubbing, distal peripheral pulses palpable. Skin: No rashes, lesions, no icterus MSK: Normal muscle bulk,tone ,power  Medications:  Scheduled Meds:  folic acid  1 mg Oral Daily   insulin aspart  0-9 Units Subcutaneous TID WC   multivitamin with minerals  1 tablet Oral Daily   thiamine  100 mg Oral Daily   Or   thiamine  100 mg Intravenous Daily   Continuous Infusions:  sodium chloride Stopped (02/02/19 0734)    Data Reviewed: I have personally reviewed following labs and imaging studies  CBC: Recent Labs  Lab 02/01/19 2237 02/03/19 0417  WBC 10.2 7.1  NEUTROABS 8.1* 5.0  HGB 14.7 12.8*  HCT 43.5 38.5*  MCV 93.8 92.1  PLT 243 315   Basic Metabolic Panel: Recent Labs  Lab 02/01/19 2237 02/02/19 0853 02/03/19 0417  NA 139 140 141  K 5.1 3.4* 3.4*  CL 107 109 109  CO2 24 23 27   GLUCOSE 119* 118* 100*  BUN 16 13 11   CREATININE 0.83 0.84 0.86  CALCIUM 10.1 9.8 9.8  MG  --  2.2  --    GFR: Estimated Creatinine Clearance: 74.9 mL/min (by C-G formula based on SCr of 0.86 mg/dL). Liver Function Tests: Recent Labs  Lab 02/01/19 2237 02/03/19 0417  AST 70* 38  ALT 18 9  ALKPHOS  74 66  BILITOT 2.6* 1.1  PROT 8.4* 6.4*  ALBUMIN 4.5 3.3*   No results for input(s): LIPASE, AMYLASE in the last 168 hours. No results for input(s): AMMONIA in the last 168 hours. Coagulation Profile: No results for input(s): INR, PROTIME in the last 168 hours. Cardiac Enzymes: Recent Labs  Lab 02/01/19 2237 02/03/19 0417  CKTOTAL 2,852* 737*   BNP (last 3 results) No results for input(s): PROBNP in the last 8760 hours. HbA1C: Recent Labs    02/03/19 0417  HGBA1C 5.5   CBG: Recent Labs  Lab 02/02/19 1112 02/02/19 1726 02/03/19 0954 02/03/19 1330  GLUCAP 107* 84 111* 122*   Lipid Profile: No results for input(s): CHOL, HDL, LDLCALC, TRIG, CHOLHDL, LDLDIRECT in the last 72 hours. Thyroid Function Tests: No results for input(s): TSH, T4TOTAL, FREET4, T3FREE, THYROIDAB in the last 72 hours. Anemia Panel: No results for input(s): VITAMINB12, FOLATE, FERRITIN, TIBC, IRON, RETICCTPCT in the last 72 hours. Sepsis Labs: No results for input(s): PROCALCITON, LATICACIDVEN in the last 168 hours.  No results found for this or any previous visit (from the past 240 hour(s)).  Radiology Studies: Ct Head Wo Contrast  Result Date: 02/02/2019 CLINICAL DATA:  Follow up subdural hematoma. EXAM: CT HEAD WITHOUT CONTRAST TECHNIQUE: Contiguous axial images were obtained from the base of the skull through the vertex without intravenous contrast. COMPARISON:  CT HEAD February 01, 2019 FINDINGS: BRAIN: Increased size of RIGHT holo hemispheric mixed density subdural hematoma, increased blood products from prior CT. Dominant component measures 22 mm, previously 20. 2 mm RIGHT to LEFT midline shift. LEFT holo hemispheric subdural hematoma measuring to 6 mm has increased. No hydrocephalus. No intraparenchymal hemorrhage. No acute large vascular territory infarcts. Patchy to confluent supratentorial white matter hypodensities. Pontine calcifications associated with vascular lesions. Basal cisterns are  patent. VASCULAR: Moderate calcific atherosclerosis of the carotid siphons. Bulbous appearance of LEFT carotid canal seen with tortuosity or aneurysm, unchanged. SKULL: No skull fracture. No significant scalp soft tissue swelling. SINUSES/ORBITS: Mild lobulated paranasal sinus mucosal thickening. Mastoid air cells are well aerated.The included ocular globes and orbital contents are non-suspicious. OTHER: None. IMPRESSION: 1. Increased size of mixed density RIGHT holo hemispheric subdural hematoma with membrane formation. Increased 6 mm LEFT mixed density subdural hematoma. No midline shift. 2. Moderate to severe chronic small vessel ischemic changes. Electronically Signed   By: Elon Alas M.D.   On: 02/02/2019 06:54   Ct Head Wo Contrast  Addendum Date: 02/01/2019   ADDENDUM REPORT: 02/01/2019 23:10 ADDENDUM: Critical Value/emergent results were called by telephone at the time of interpretation on 02/01/2019 at 2303 hours to [facility] , who verbally acknowledged these results. Electronically Signed   By: Genevie Ann M.D.   On: 02/01/2019 23:10   Result Date: 02/01/2019 CLINICAL DATA:  85 year old male status post unwitnessed fall. EXAM: CT HEAD WITHOUT CONTRAST CT CERVICAL SPINE WITHOUT CONTRAST TECHNIQUE: Multidetector CT imaging of the head and cervical spine was performed following the standard protocol without intravenous contrast. Multiplanar CT image reconstructions of the cervical spine were also generated. COMPARISON:  None. FINDINGS: CT HEAD FINDINGS Brain: Mixed density hemorrhage in the right hemisphere is extra-axial and lobulated but probably subdural. Hyperdense septations or developing thrombus within the lobulated but contiguous collection (sagittal image 16) which measures up to 18-20 millimeters in thickness (series 3, image 19 and coronal image 51). With associated trace leftward midline shift and overall mild mass effect on the right hemisphere. Normal basilar cisterns. No other  intracranial hemorrhage identified. Patchy bilateral cerebral white matter hypodensity. No ventriculomegaly. Nonspecific dystrophic calcifications in the pons. No ventriculomegalyor evidence of cortically based acute infarction. No cortical encephalomalacia identified. Vascular: Calcified atherosclerosis at the skull base. No suspicious intracranial vascular hyperdensity. Skull: No skull fracture identified. Sinuses/Orbits: Trace paranasal sinus mucosal thickening. Tympanic cavities and mastoids are clear. Other: Negative orbits. No scalp hematoma identified. CT CERVICAL SPINE FINDINGS Alignment: Straightening of cervical lordosis. Cervicothoracic junction alignment is within normal limits. Bilateral posterior element alignment is within normal limits. Skull base and vertebrae: Visualized skull base is intact. No atlanto-occipital dissociation. No acute osseous abnormality identified. Soft tissues and spinal canal: No prevertebral fluid or swelling. No visible canal hematoma. Negative noncontrast neck soft tissues. Disc levels:  Mild for age cervical spine degeneration. Upper chest: Visible upper thoracic levels appear intact. Negative lung apices and negative noncontrast thoracic inlet. IMPRESSION: 1. Mixed density and lobulated right side subdural hematoma. This is age indeterminate and measures up to 18-20 mm in thickness. Associated mild mass effect on the right hemisphere with trace leftward midline shift. No associated skull fracture identified. 2. Nonspecific cerebral white matter  changes, most commonly due to small vessel disease. 3.  No acute traumatic injury identified in the cervical spine. Electronically Signed: By: Genevie Ann M.D. On: 02/01/2019 22:57   Ct Cervical Spine Wo Contrast  Addendum Date: 02/01/2019   ADDENDUM REPORT: 02/01/2019 23:10 ADDENDUM: Critical Value/emergent results were called by telephone at the time of interpretation on 02/01/2019 at 2303 hours to [facility] , who verbally  acknowledged these results. Electronically Signed   By: Genevie Ann M.D.   On: 02/01/2019 23:10   Result Date: 02/01/2019 CLINICAL DATA:  85 year old male status post unwitnessed fall. EXAM: CT HEAD WITHOUT CONTRAST CT CERVICAL SPINE WITHOUT CONTRAST TECHNIQUE: Multidetector CT imaging of the head and cervical spine was performed following the standard protocol without intravenous contrast. Multiplanar CT image reconstructions of the cervical spine were also generated. COMPARISON:  None. FINDINGS: CT HEAD FINDINGS Brain: Mixed density hemorrhage in the right hemisphere is extra-axial and lobulated but probably subdural. Hyperdense septations or developing thrombus within the lobulated but contiguous collection (sagittal image 16) which measures up to 18-20 millimeters in thickness (series 3, image 19 and coronal image 51). With associated trace leftward midline shift and overall mild mass effect on the right hemisphere. Normal basilar cisterns. No other intracranial hemorrhage identified. Patchy bilateral cerebral white matter hypodensity. No ventriculomegaly. Nonspecific dystrophic calcifications in the pons. No ventriculomegalyor evidence of cortically based acute infarction. No cortical encephalomalacia identified. Vascular: Calcified atherosclerosis at the skull base. No suspicious intracranial vascular hyperdensity. Skull: No skull fracture identified. Sinuses/Orbits: Trace paranasal sinus mucosal thickening. Tympanic cavities and mastoids are clear. Other: Negative orbits. No scalp hematoma identified. CT CERVICAL SPINE FINDINGS Alignment: Straightening of cervical lordosis. Cervicothoracic junction alignment is within normal limits. Bilateral posterior element alignment is within normal limits. Skull base and vertebrae: Visualized skull base is intact. No atlanto-occipital dissociation. No acute osseous abnormality identified. Soft tissues and spinal canal: No prevertebral fluid or swelling. No visible canal  hematoma. Negative noncontrast neck soft tissues. Disc levels:  Mild for age cervical spine degeneration. Upper chest: Visible upper thoracic levels appear intact. Negative lung apices and negative noncontrast thoracic inlet. IMPRESSION: 1. Mixed density and lobulated right side subdural hematoma. This is age indeterminate and measures up to 18-20 mm in thickness. Associated mild mass effect on the right hemisphere with trace leftward midline shift. No associated skull fracture identified. 2. Nonspecific cerebral white matter changes, most commonly due to small vessel disease. 3.  No acute traumatic injury identified in the cervical spine. Electronically Signed: By: Genevie Ann M.D. On: 02/01/2019 22:57   Vas US Carotid  Result Date: 02/02/2019 Carotid Arterial Duplex Study Indications: Syncope. Limitations: tortuousity and patient talking/movement Performing Technologist: Abram Sander RVS  Examination Guidelines: A complete evaluation includes B-mode imaging, spectral Doppler, color Doppler, and power Doppler as needed of all accessible portions of each vessel. Bilateral testing is considered an integral part of a complete examination. Limited examinations for reoccurring indications may be performed as noted.  Right Carotid Findings: +----------+--------+--------+--------+------------+--------------+             PSV cm/s EDV cm/s Stenosis Describe     Comments        +----------+--------+--------+--------+------------+--------------+  CCA Prox   78       11                heterogenous                 +----------+--------+--------+--------+------------+--------------+  CCA Distal 46  7                 heterogenous                 +----------+--------+--------+--------+------------+--------------+  ICA Prox   239      49       40-59%   heterogenous tortuous        +----------+--------+--------+--------+------------+--------------+  ICA Mid    95       14                             tortuous         +----------+--------+--------+--------+------------+--------------+  ICA Distal                                         Not visualized  +----------+--------+--------+--------+------------+--------------+  ECA        76                                                      +----------+--------+--------+--------+------------+--------------+ +----------+--------+-------+--------+-------------------+             PSV cm/s EDV cms Describe Arm Pressure (mmHG)  +----------+--------+-------+--------+-------------------+  Subclavian 90                                             +----------+--------+-------+--------+-------------------+ +---------+--------+--+--------+---------+  Vertebral PSV cm/s 53 EDV cm/s Antegrade  +---------+--------+--+--------+---------+  Left Carotid Findings: +----------+--------+--------+--------+------------+--------+             PSV cm/s EDV cm/s Stenosis Describe     Comments  +----------+--------+--------+--------+------------+--------+  CCA Prox   123      14                heterogenous           +----------+--------+--------+--------+------------+--------+  CCA Distal 80       15                heterogenous           +----------+--------+--------+--------+------------+--------+  ICA Prox   127      35       1-39%    heterogenous           +----------+--------+--------+--------+------------+--------+  ICA Distal 114      24                                       +----------+--------+--------+--------+------------+--------+  ECA        103                                               +----------+--------+--------+--------+------------+--------+ +----------+--------+--------+--------+-------------------+  Subclavian PSV cm/s EDV cm/s Describe Arm Pressure (mmHG)  +----------+--------+--------+--------+-------------------+             92                                              +----------+--------+--------+--------+-------------------+ +---------+--------+--+--------+--+---------+  Vertebral PSV cm/s 62 EDV cm/s 12 Antegrade  +---------+--------+--+--------+--+---------+  Summary: Right Carotid: Velocities in the right ICA are consistent with a 40-59%                stenosis. Left Carotid: Velocities in the left ICA are consistent with a 1-39% stenosis. Vertebrals: Bilateral vertebral arteries demonstrate antegrade flow. *See table(s) above for measurements and observations.  Electronically signed by Deitra Mayo MD on 02/02/2019 at 4:26:52 PM.    Final    US Abdomen Limited Ruq  Result Date: 02/02/2019 CLINICAL DATA:  Elevated liver function tests. History of colon cancer. EXAM: ULTRASOUND ABDOMEN LIMITED RIGHT UPPER QUADRANT COMPARISON:  None. FINDINGS: Gallbladder: No gallstones or wall thickening visualized. No sonographic Murphy sign noted by sonographer. Common bile duct: Diameter: 5 mm Liver: No focal lesion identified. Normal parenchymal echogenicity. Portal vein is patent on color Doppler imaging with normal direction of blood flow towards the liver. IMPRESSION: Negative RIGHT upper quadrant ultrasound. Electronically Signed   By: Elon Alas M.D.   On: 02/02/2019 03:10      LOS: 1 day   Time spent: More than 50% of that time was spent in counseling and/or coordination of care.  Antonieta Pert, MD [facility]  02/03/2019, 2:22 PM

## 2019-02-04 ENCOUNTER — Inpatient Hospital Stay (HOSPITAL_COMMUNITY): Payer: Medicare Other

## 2019-02-04 LAB — GLUCOSE, CAPILLARY
Glucose-Capillary: 140 mg/dL — ABNORMAL HIGH (ref 70–99)
Glucose-Capillary: 147 mg/dL — ABNORMAL HIGH (ref 70–99)
Glucose-Capillary: 121 mg/dL — ABNORMAL HIGH (ref 70–99)
Glucose-Capillary: 149 mg/dL — ABNORMAL HIGH (ref 70–99)
Glucose-Capillary: 154 mg/dL — ABNORMAL HIGH (ref 70–99)

## 2019-02-04 LAB — CK: Total CK: 371 U/L (ref 49–397)

## 2019-02-04 MED ORDER — QUETIAPINE FUMARATE 50 MG PO TABS
50.0000 mg | ORAL_TABLET | Freq: Every day | ORAL | Status: DC
  Administered 2019-02-04: 50 mg via ORAL
  Filled 2019-02-04: qty 1

## 2019-02-04 MED ORDER — SODIUM CHLORIDE 0.9 % IV SOLN
INTRAVENOUS | Status: DC
  Administered 2019-02-04 (×2): via INTRAVENOUS

## 2019-02-04 NOTE — Progress Notes (Signed)
Patient ID: Keith Pittman, male   DOB: [birth year], 85 y.o.   MRN: [id number] Head CT reviewed this morning essentially shows no change.  Patient neurologically awake and alert appropriate still does not wish any intervention I think it is okay for him to be transferred to a skilled nursing facility and follow-up with me in 1-2 weeks.

## 2019-02-04 NOTE — Plan of Care (Signed)
Progressing

## 2019-02-04 NOTE — Progress Notes (Signed)
PROGRESS NOTE    Keith Pittman  GHW:[id number] DOB: 1932/08/15 DOA: 02/01/2019 PCP: Lavone Orn, MD   Brief narrative 85 year old male with history of colon cancer, T2 DM, who lives alone presented to the ER for evaluation of an unwitnessed fall.  Patient reports that he has had multiple falls in the past.  He lioves alone and his sister checks on the patient and brings food, and on 02/01/19 when sister called around dinnertime there was no response, and family arrived and patient was found facedown on his bed but he was conscious.  Patient reports that he had woken up, sat up and said " I blacked out" , details not very clear patient unable to describe more.  Patient was seen in the ER and CT scan of the head showed subdural hematoma, mild rhabdomyolysis of 2852 was admitted for further work-up.  Subjective: Patient has been agitated overnight needing restraint, Ativan.  This morning he was sedated after medication.  CT head was repeated no new changes.  Assessment & Plan:  Subdural hematoma: Seen by neurosurgery, no surgical intervention anticipated.Repeat CT head 3/10 am "Increased size of mixed density RIGHT holo hemispheric subdural hematoma with membrane formation. Increased 6 mm LEFT mixed densitysubdural hematoma. No midline shift."  Neurosurgery had extensive discussion with patient and sister at this time not planning on any surgical intervention, is high risk.  CT head repeated as above with no it is " stable right larger than left subdural hematoma over the cerebral convexities. Stable associated mass effect with minimal right-to-left midline shift."  Placed on IV Decadron per neurosurgery, continue and wean.  Unwitnessed fall/ ?syncope: With some beer drinking at home but denies daily use.Unclear etiology, orthostatic vitals stable, EEG carotid Dopplers, echocardiogram unremarkable.  Reported multiple falls at home.  Continue PT OT.  Work-up with TTE: 60-65%, Carotid duplex: 40-59% rt  carotid, left carotid- 1-39% stenosis. EEG neg for seizure.No fever and no leucocytosis.  Urine drug screen unremarkable. Cont pt/ot.  Monitor orthostatic vitals.  Agitated overnight likely sundowning, ?etoh withdrawal, has history of mild dementia.  CT head this am no major changes. We will try to keep him on Seroquel at bedtime to regulate his sleep cycle, keep him off restrain, nad avoid sedatives if possible.  Rhabdomyolysis, CK 2852-improved to 371. Cont oral hydration  Elevated LFTs: from rhabdo. Resolved. RUQ [place] negative.  Alcohol use: Advised cessation.  No signs of withdrawal.  Continue multivitamins folate and thiamine.    Type 2 diabetes: a1c stable. Controlled, blood sugar is stable.  It is diet controlled.  Keep on gentle IV fluids while patient is sedated and not having his meals today.  DVT prophylaxis: SCDs Code Status: Full code. Family Communication: No family at bedside. Disposition Plan:  Anticipating SNF once mental status stable.   Consults called: Neurosurgery    TTE:The left ventricle has normal systolic function with an ejection fraction of 60-65%. The cavity size was normal. There is mildly increased left ventricular wall thickness. Left ventricular diastolic Doppler parameters are consistent with impaired  relaxation.  2. The right ventricle has normal systolic function. The cavity was normal. There is no increase in right ventricular wall thickness.  3. The tricuspid valve is grossly normal.  4. The aortic valve is tricuspid Mild thickening of the aortic valve.  5. The pulmonic valve was not well visualized. Pulmonic valve regurgitation is mild by color flow Doppler.  6. Normal LV systolic function; mild LVH; mild diastolic dysfunction.  Antimicrobials: Anti-infectives (From  admission, onward)   None       Objective: Vitals:   02/03/19 2015 02/03/19 2257 02/04/19 0300 02/04/19 1231  BP: 123/73 117/67 121/69   Pulse: 62 68 (!) 47   Resp:       Temp: 98.8 F (37.1 C) 98.2 F (36.8 C) 97.6 F (36.4 C) 97.7 F (36.5 C)  TempSrc: Axillary Oral Oral Oral  SpO2: 100% 98%    Weight:      Height:        Intake/Output Summary (Last 24 hours) at 02/04/2019 1350 Last data filed at 02/04/2019 0500 Gross per 24 hour  Intake 1230 ml  Output 1000 ml  Net 230 ml   Filed Weights   02/03/19 0104  Weight: 94.1 kg   Weight change:   Body mass index is 28.93 kg/m.  Intake/Output from previous day: 03/11 0701 - 03/12 0700 In: 1710 [P.O.:960; I.V.:750] Out: 1000 [Urine:1000] Intake/Output this shift: No intake/output data recorded.  Examination:  General exam: Sedated, breathing spontaneously on room air.Obese. HEENT:Oral mucosa moist, Ear/Nose WNL grossly, dentition normal. Respiratory system: Bilateral equal air entry, no crackles and wheezing, no use of accessory muscle, non tender on palpation. Cardiovascular system: regular rate and rhythm, S1 & S2 heard, No JVD/murmurs. Gastrointestinal system: Abdomen soft, non-tender, non-distended, BS +. No hepatosplenomegaly palpable. Nervous System: sedated, lied exam. PERRLA. Extremities: No edema, distal peripheral pulses palpable.  Skin: No rashes,no icterus. MSK: Normal muscle bulk,tone, power  Medications:  Scheduled Meds: . dexamethasone  4 mg Intravenous Z1I  . folic acid  1 mg Oral Daily  . insulin aspart  0-9 Units Subcutaneous TID WC  . multivitamin with minerals  1 tablet Oral Daily  . thiamine  100 mg Oral Daily   Or  . thiamine  100 mg Intravenous Daily   Continuous Infusions: . lactated ringers 75 mL/hr at 02/04/19 0500  . sodium chloride Stopped (02/02/19 0734)    Data Reviewed: I have personally reviewed following labs and imaging studies  CBC: Recent Labs  Lab 02/01/19 2237 02/03/19 0417  WBC 10.2 7.1  NEUTROABS 8.1* 5.0  HGB 14.7 12.8*  HCT 43.5 38.5*  MCV 93.8 92.1  PLT 243 458   Basic Metabolic Panel: Recent Labs  Lab 02/01/19 2237  02/02/19 0853 02/03/19 0417  NA 139 140 141  K 5.1 3.4* 3.4*  CL 107 109 109  CO2 24 23 27   GLUCOSE 119* 118* 100*  BUN 16 13 11   CREATININE 0.83 0.84 0.86  CALCIUM 10.1 9.8 9.8  MG  --  2.2  --    GFR: Estimated Creatinine Clearance: 74.9 mL/min (by C-G formula based on SCr of 0.86 mg/dL). Liver Function Tests: Recent Labs  Lab 02/01/19 2237 02/03/19 0417  AST 70* 38  ALT 18 9  ALKPHOS 74 66  BILITOT 2.6* 1.1  PROT 8.4* 6.4*  ALBUMIN 4.5 3.3*   No results for input(s): LIPASE, AMYLASE in the last 168 hours. No results for input(s): AMMONIA in the last 168 hours. Coagulation Profile: No results for input(s): INR, PROTIME in the last 168 hours. Cardiac Enzymes: Recent Labs  Lab 02/01/19 2237 02/03/19 0417 02/04/19 0407  CKTOTAL 2,852* 737* 371   BNP (last 3 results) No results for input(s): PROBNP in the last 8760 hours. HbA1C: Recent Labs    02/03/19 0417  HGBA1C 5.5   CBG: Recent Labs  Lab 02/03/19 1655 02/03/19 2108 02/04/19 0936 02/04/19 0948 02/04/19 1205  GLUCAP 102* 118* 147* 140* 121*  Lipid Profile: No results for input(s): CHOL, HDL, LDLCALC, TRIG, CHOLHDL, LDLDIRECT in the last 72 hours. Thyroid Function Tests: No results for input(s): TSH, T4TOTAL, FREET4, T3FREE, THYROIDAB in the last 72 hours. Anemia Panel: No results for input(s): VITAMINB12, FOLATE, FERRITIN, TIBC, IRON, RETICCTPCT in the last 72 hours. Sepsis Labs: No results for input(s): PROCALCITON, LATICACIDVEN in the last 168 hours.  No results found for this or any previous visit (from the past 240 hour(s)).    Radiology Studies: Ct Head Wo Contrast  Result Date: 02/04/2019 CLINICAL DATA:  85 y/o  M; follow-up of subdural hematoma. EXAM: CT HEAD WITHOUT CONTRAST TECHNIQUE: Contiguous axial images were obtained from the base of the skull through the vertex without intravenous contrast. COMPARISON:  02/02/2019 CT head. FINDINGS: Brain: Stable extensive subdural hematoma over  the right cerebral convexity with membrane formation measuring up to 21 mm over the frontal lobe. Stable low-attenuation subdural hematoma over the left cerebral convexity measuring up to 6 mm over the left frontal lobe. Stable associated mass effect with minimal 1-2 mm right-to-left midline shift. No new acute intracranial hemorrhage, extra-axial collection, hydrocephalus, or herniation. Nonspecific white matter hypodensities are stable and compatible with chronic microvascular ischemic changes. Stable volume loss of the brain. Stable nonspecific calcification within the left hemi pons. Vascular: Calcific atherosclerosis of the carotid siphons. No hyperdense vessel identified. Skull: Normal. Negative for fracture or focal lesion. Sinuses/Orbits: No acute finding. Small left maxillary sinus mucous retention cyst. Other: None. IMPRESSION: 1. Stable right larger than left subdural hematoma over the cerebral convexities. Stable associated mass effect with minimal right-to-left midline shift. 2. Stable moderate to severe chronic microvascular ischemic changes and volume loss of the brain. 3. No new acute intracranial abnormality identified. Electronically Signed   By: Kristine Garbe M.D.   On: 02/04/2019 05:44      LOS: 2 days   Time spent: More than 50% of that time was spent in counseling and/or coordination of care.  Antonieta Pert, MD [facility]  02/04/2019, 1:50 PM

## 2019-02-05 DIAGNOSIS — S065X9A Traumatic subdural hemorrhage with loss of consciousness of unspecified duration, initial encounter: Secondary | ICD-10-CM | POA: Diagnosis not present

## 2019-02-05 DIAGNOSIS — R41841 Cognitive communication deficit: Secondary | ICD-10-CM | POA: Diagnosis not present

## 2019-02-05 DIAGNOSIS — H6123 Impacted cerumen, bilateral: Secondary | ICD-10-CM | POA: Diagnosis not present

## 2019-02-05 DIAGNOSIS — M6282 Rhabdomyolysis: Secondary | ICD-10-CM | POA: Diagnosis not present

## 2019-02-05 DIAGNOSIS — Z7289 Other problems related to lifestyle: Secondary | ICD-10-CM | POA: Diagnosis not present

## 2019-02-05 DIAGNOSIS — R278 Other lack of coordination: Secondary | ICD-10-CM | POA: Diagnosis not present

## 2019-02-05 DIAGNOSIS — R451 Restlessness and agitation: Secondary | ICD-10-CM | POA: Diagnosis not present

## 2019-02-05 DIAGNOSIS — R296 Repeated falls: Secondary | ICD-10-CM | POA: Diagnosis not present

## 2019-02-05 DIAGNOSIS — K30 Functional dyspepsia: Secondary | ICD-10-CM | POA: Diagnosis not present

## 2019-02-05 DIAGNOSIS — R945 Abnormal results of liver function studies: Secondary | ICD-10-CM | POA: Diagnosis not present

## 2019-02-05 DIAGNOSIS — Z85038 Personal history of other malignant neoplasm of large intestine: Secondary | ICD-10-CM | POA: Diagnosis not present

## 2019-02-05 DIAGNOSIS — Z781 Physical restraint status: Secondary | ICD-10-CM | POA: Diagnosis not present

## 2019-02-05 DIAGNOSIS — F05 Delirium due to known physiological condition: Secondary | ICD-10-CM | POA: Diagnosis not present

## 2019-02-05 DIAGNOSIS — F0390 Unspecified dementia without behavioral disturbance: Secondary | ICD-10-CM | POA: Diagnosis not present

## 2019-02-05 DIAGNOSIS — M6281 Muscle weakness (generalized): Secondary | ICD-10-CM | POA: Diagnosis not present

## 2019-02-05 DIAGNOSIS — Z87891 Personal history of nicotine dependence: Secondary | ICD-10-CM | POA: Diagnosis not present

## 2019-02-05 DIAGNOSIS — S065X0A Traumatic subdural hemorrhage without loss of consciousness, initial encounter: Secondary | ICD-10-CM | POA: Diagnosis not present

## 2019-02-05 DIAGNOSIS — Z9181 History of falling: Secondary | ICD-10-CM | POA: Diagnosis not present

## 2019-02-05 DIAGNOSIS — R2681 Unsteadiness on feet: Secondary | ICD-10-CM | POA: Diagnosis not present

## 2019-02-05 DIAGNOSIS — I6521 Occlusion and stenosis of right carotid artery: Secondary | ICD-10-CM | POA: Diagnosis not present

## 2019-02-05 DIAGNOSIS — S065X9D Traumatic subdural hemorrhage with loss of consciousness of unspecified duration, subsequent encounter: Secondary | ICD-10-CM | POA: Diagnosis not present

## 2019-02-05 DIAGNOSIS — E119 Type 2 diabetes mellitus without complications: Secondary | ICD-10-CM | POA: Diagnosis not present

## 2019-02-05 DIAGNOSIS — F419 Anxiety disorder, unspecified: Secondary | ICD-10-CM | POA: Diagnosis not present

## 2019-02-05 DIAGNOSIS — I493 Ventricular premature depolarization: Secondary | ICD-10-CM | POA: Diagnosis not present

## 2019-02-05 DIAGNOSIS — R52 Pain, unspecified: Secondary | ICD-10-CM | POA: Diagnosis not present

## 2019-02-05 LAB — GLUCOSE, CAPILLARY
GLUCOSE-CAPILLARY: 146 mg/dL — AB (ref 70–99)
Glucose-Capillary: 165 mg/dL — ABNORMAL HIGH (ref 70–99)

## 2019-02-05 MED ORDER — FOLIC ACID 1 MG PO TABS: 1.0000 mg | ORAL_TABLET | Freq: Every day | ORAL | Status: DC

## 2019-02-05 MED ORDER — METHYLPREDNISOLONE 4 MG PO TBPK: ORAL_TABLET | ORAL | Status: DC

## 2019-02-05 MED ORDER — THIAMINE HCL 100 MG PO TABS: 100.0000 mg | ORAL_TABLET | Freq: Every day | ORAL | Status: DC

## 2019-02-05 NOTE — Progress Notes (Signed)
NEUROSURGERY PROGRESS NOTE  Doing well.No acute changes overnight.   Temp:  [97.7 F (36.5 C)-98.3 F (36.8 C)] 97.7 F (36.5 C) (03/13 0422) Pulse Rate:  [56-69] 69 (03/13 0422) Resp:  [22] 22 (03/12 2014) BP: (115-128)/(64-69) 127/69 (03/13 0422) SpO2:  [93 %-98 %] 98 % (03/13 0422)  Plan: Follow up in office in a couple weeks  Eleonore Chiquito, NP 02/05/2019 10:22 AM

## 2019-02-05 NOTE — Care Management Important Message (Signed)
Important Message  Patient Details  Name: Keith Pittman MRN: [id number] Date of Birth: 06-23-1932   Medicare Important Message Given:  Yes    [name] [name] 02/05/2019, 2:55 PM

## 2019-02-05 NOTE — Discharge Summary (Signed)
Physician Discharge Summary  ALF DOYLE VCB:[id number] DOB: January 24, 1932 DOA: 02/01/2019  PCP: Lavone Orn, MD  Admit date: 02/01/2019 Discharge date: 02/05/2019  Admitted From: HOME Disposition: SNF  Recommendations for Outpatient Follow-up:  1. Follow up with PCP in 1-2 weeks 2. Please obtain BMP/CBC in one week 3. Please follow up on the following pending results:  Home Health: no Equipment/Devices:none  Discharge Condition: Stable CODE STATUS: Full code Diet recommendation: diabetic diet  Brief/Interim Summary: Brief narrative 85 year old male with history of colon cancer, T2 DM, who lives alone presented to the ER for evaluation of an unwitnessed fall.  Patient reports that he has had multiple falls in the past.  He lioves alone and his sister checks on the patient and brings food, and on 02/01/19 when sister called around dinnertime there was no response, and family arrived and patient was found facedown on his bed but he was conscious.  Patient reports that he had woken up, sat up and said " I blacked out" , details not very clear patient unable to describe more.  Patient was seen in the ER and CT scan of the head showed subdural hematoma, mild rhabdomyolysis of 2852 was admitted for further work-up. Patient was seen by neurosurgery, multiple CT head were repeated, at this point no surgical intervention is being planned and okay to discharge to skilled nursing facility and have outpatient follow up neurosurgery.  Assessment & Plan:  Subdural hematoma: Seen by neurosurgery, no surgical intervention anticipated.Repeat CT head 3/10 am "Increased size of mixed density RIGHT holo hemispheric subdural hematoma with membrane formation. Increased 6 mm LEFT mixed densitysubdural hematoma. No midline shift."  Neurosurgery had extensive discussion with patient and sister at this time not planning on any surgical intervention, is high risk.  CT head repeated as above with no it is " stable  right larger than left subdural hematoma over the cerebral convexities. Stable associated mass effect with minimal right-to-left midline shift."  Placed on IV Decadron per neurosurgery, will place him on tapering dose.  Patient is to follow-up with neurosurgery in a couple of weeks.  Denies any headache focal weakness numbness or tingling.  Appears asymptomatic.  Unwitnessed fall/?syncope: With some beer drinking at home but denies daily use.Unclear etiology, orthostatic vitals stable, EEG, carotid Dopplers, echocardiogram unremarkable.  Reported multiple falls at home.  Continue PT OT.  Work-up with TTE: 60-65%, Carotid duplex: 40-59% Rt carotid, left carotid- 1-39% stenosis. EEG neg for seizure. No fever and no leucocytosis.  Urine drug screen unremarkable. Cont pt/ot.   Sinus bradycardia : Mostly during sleep.  Appears asymptomatic.  I discussed with the cardiology master, advises follow-up outpatient, no further inpatient work-up needed, patient echocardiogram was unremarkable.  They will arrange for outpatient cardiac monitoring device in few days.   Agitated overnight likely sundowning, ?etoh withdrawal, has history of mild dementia mental.  Is currently at baseline.  Avoid any psychotropic medication.  Patient is alert awake oriented and at baseline.  Rhabdomyolysis, CK 2852-improved to 371. Cont oral hydration  Elevated LFTs: from rhabdo. Resolved. RUQ [place] negative.  Alcohol use: Advised cessation.  No signs of withdrawal.  Continue multivitamins folate and thiamine.   Type 2 diabetes: a1c stable. Controlled, blood sugar is stable.  It is diet controlled.  DVT prophylaxis:SCDs Code Status:Full code. Family Communication:sister at bedside. Disposition Plan:SNF today Consults called:Neurosurgery   TTE:The left ventricle has normal systolic function with an ejection fraction of 60-65%. The cavity size was normal. There is mildly increased left  ventricular wall thickness. Left  ventricular diastolic Doppler parameters are consistent with impaired  relaxation. 2. The right ventricle has normal systolic function. The cavity was normal. There is no increase in right ventricular wall thickness. 3. The tricuspid valve is grossly normal. 4. The aortic valve is tricuspid Mild thickening of the aortic valve. 5. The pulmonic valve was not well visualized. Pulmonic valve regurgitation is mild by color flow Doppler. 6. Normal LV systolic function; mild LVH; mild diastolic dysfunction  Discharge Diagnoses:  Principal Problem:   Subdural hematoma (HCC) Active Problems:   Unwitnessed fall   Rhabdomyolysis   Elevated LFTs   Alcohol use  Discharge Instructions  Discharge Instructions    Diet - low sodium heart healthy   Complete by:  As directed    Increase activity slowly   Complete by:  As directed    Other Restrictions   Complete by:  As directed    Please call call MD or return to ER for similar recurring problem, nausea/vomiting, uncontrolled pain, abdominal pain chest pain, shortness of breath, fever. Follow-up with the cardiology clinic for cardiac monitor. Please follow-up your doctor as instructed in few days. Please avoid alcohol, smoking, or any other substance.     Allergies as of 02/05/2019      Reactions   Sulfa Antibiotics    Sulfa containing drugs      Medication List    TAKE these medications   acetaminophen 325 MG tablet Commonly known as:  TYLENOL Take 650 mg by mouth every 6 (six) hours as needed.   bismuth subsalicylate 294 TM/54YT suspension Commonly known as:  PEPTO BISMOL Take 30 mLs by mouth every 6 (six) hours as needed for indigestion.   folic acid 1 MG tablet Commonly known as:  FOLVITE Take 1 tablet (1 mg total) by mouth daily. Start taking on:  February 06, 2019   methylPREDNISolone 4 MG Tbpk tablet Commonly known as:  MEDROL DOSEPAK TAKE PO AS INSTRUCTED in pack:6 tabs daily x1,5 tabs daily x1,4 tabs daily x1,3 tabs  daily x1,2 tabs dailyx1,1 tab dailyx1.   thiamine 100 MG tablet Take 1 tablet (100 mg total) by mouth daily. Start taking on:  February 06, 2019       Contact information for follow-up providers    Kary Kos, MD Follow up in 2 week(s).   Specialty:  Neurosurgery Contact information: [address] [place] [address] [phone]            Contact information for after-discharge care    Destination    [facility] Preferred SNF .   Service:  Skilled Nursing Contact information: [place] [phone]                 Allergies  Allergen Reactions  . Sulfa Antibiotics     Sulfa containing drugs     Consultations:  Cardiology   Procedures/Studies: Ct Head Wo Contrast  Result Date: 02/04/2019 CLINICAL DATA:  85 y/o  M; follow-up of subdural hematoma. EXAM: CT HEAD WITHOUT CONTRAST TECHNIQUE: Contiguous axial images were obtained from the base of the skull through the vertex without intravenous contrast. COMPARISON:  02/02/2019 CT head. FINDINGS: Brain: Stable extensive subdural hematoma over the right cerebral convexity with membrane formation measuring up to 21 mm over the frontal lobe. Stable low-attenuation subdural hematoma over the left cerebral convexity measuring up to 6 mm over the left frontal lobe. Stable associated mass effect with minimal  1-2 mm right-to-left midline shift. No new acute intracranial hemorrhage, extra-axial collection, hydrocephalus, or herniation. Nonspecific white matter hypodensities are stable and compatible with chronic microvascular ischemic changes. Stable volume loss of the brain. Stable nonspecific calcification within the left hemi pons. Vascular: Calcific atherosclerosis of the carotid siphons. No hyperdense vessel identified. Skull: Normal. Negative for fracture or focal lesion. Sinuses/Orbits: No acute finding. Small left maxillary sinus mucous  retention cyst. Other: None. IMPRESSION: 1. Stable right larger than left subdural hematoma over the cerebral convexities. Stable associated mass effect with minimal right-to-left midline shift. 2. Stable moderate to severe chronic microvascular ischemic changes and volume loss of the brain. 3. No new acute intracranial abnormality identified. Electronically Signed   By: Kristine Garbe M.D.   On: 02/04/2019 05:44   Ct Head Wo Contrast  Result Date: 02/02/2019 CLINICAL DATA:  Follow up subdural hematoma. EXAM: CT HEAD WITHOUT CONTRAST TECHNIQUE: Contiguous axial images were obtained from the base of the skull through the vertex without intravenous contrast. COMPARISON:  CT HEAD February 01, 2019 FINDINGS: BRAIN: Increased size of RIGHT holo hemispheric mixed density subdural hematoma, increased blood products from prior CT. Dominant component measures 22 mm, previously 20. 2 mm RIGHT to LEFT midline shift. LEFT holo hemispheric subdural hematoma measuring to 6 mm has increased. No hydrocephalus. No intraparenchymal hemorrhage. No acute large vascular territory infarcts. Patchy to confluent supratentorial white matter hypodensities. Pontine calcifications associated with vascular lesions. Basal cisterns are patent. VASCULAR: Moderate calcific atherosclerosis of the carotid siphons. Bulbous appearance of LEFT carotid canal seen with tortuosity or aneurysm, unchanged. SKULL: No skull fracture. No significant scalp soft tissue swelling. SINUSES/ORBITS: Mild lobulated paranasal sinus mucosal thickening. Mastoid air cells are well aerated.The included ocular globes and orbital contents are non-suspicious. OTHER: None. IMPRESSION: 1. Increased size of mixed density RIGHT holo hemispheric subdural hematoma with membrane formation. Increased 6 mm LEFT mixed density subdural hematoma. No midline shift. 2. Moderate to severe chronic small vessel ischemic changes. Electronically Signed   By: Elon Alas M.D.    On: 02/02/2019 06:54   Ct Head Wo Contrast  Addendum Date: 02/01/2019   ADDENDUM REPORT: 02/01/2019 23:10 ADDENDUM: Critical Value/emergent results were called by telephone at the time of interpretation on 02/01/2019 at 2303 hours to [facility] , who verbally acknowledged these results. Electronically Signed   By: Genevie Ann M.D.   On: 02/01/2019 23:10   Result Date: 02/01/2019 CLINICAL DATA:  85 year old male status post unwitnessed fall. EXAM: CT HEAD WITHOUT CONTRAST CT CERVICAL SPINE WITHOUT CONTRAST TECHNIQUE: Multidetector CT imaging of the head and cervical spine was performed following the standard protocol without intravenous contrast. Multiplanar CT image reconstructions of the cervical spine were also generated. COMPARISON:  None. FINDINGS: CT HEAD FINDINGS Brain: Mixed density hemorrhage in the right hemisphere is extra-axial and lobulated but probably subdural. Hyperdense septations or developing thrombus within the lobulated but contiguous collection (sagittal image 16) which measures up to 18-20 millimeters in thickness (series 3, image 19 and coronal image 51). With associated trace leftward midline shift and overall mild mass effect on the right hemisphere. Normal basilar cisterns. No other intracranial hemorrhage identified. Patchy bilateral cerebral white matter hypodensity. No ventriculomegaly. Nonspecific dystrophic calcifications in the pons. No ventriculomegalyor evidence of cortically based acute infarction. No cortical encephalomalacia identified. Vascular: Calcified atherosclerosis at the skull base. No suspicious intracranial vascular hyperdensity. Skull: No skull fracture identified. Sinuses/Orbits: Trace paranasal sinus mucosal thickening. Tympanic cavities and mastoids are clear. Other: Negative orbits. No scalp  hematoma identified. CT CERVICAL SPINE FINDINGS Alignment: Straightening of cervical lordosis. Cervicothoracic junction alignment is within normal limits. Bilateral  posterior element alignment is within normal limits. Skull base and vertebrae: Visualized skull base is intact. No atlanto-occipital dissociation. No acute osseous abnormality identified. Soft tissues and spinal canal: No prevertebral fluid or swelling. No visible canal hematoma. Negative noncontrast neck soft tissues. Disc levels:  Mild for age cervical spine degeneration. Upper chest: Visible upper thoracic levels appear intact. Negative lung apices and negative noncontrast thoracic inlet. IMPRESSION: 1. Mixed density and lobulated right side subdural hematoma. This is age indeterminate and measures up to 18-20 mm in thickness. Associated mild mass effect on the right hemisphere with trace leftward midline shift. No associated skull fracture identified. 2. Nonspecific cerebral white matter changes, most commonly due to small vessel disease. 3.  No acute traumatic injury identified in the cervical spine. Electronically Signed: By: Genevie Ann M.D. On: 02/01/2019 22:57   Ct Cervical Spine Wo Contrast  Addendum Date: 02/01/2019   ADDENDUM REPORT: 02/01/2019 23:10 ADDENDUM: Critical Value/emergent results were called by telephone at the time of interpretation on 02/01/2019 at 2303 hours to [facility] , who verbally acknowledged these results. Electronically Signed   By: Genevie Ann M.D.   On: 02/01/2019 23:10   Result Date: 02/01/2019 CLINICAL DATA:  85 year old male status post unwitnessed fall. EXAM: CT HEAD WITHOUT CONTRAST CT CERVICAL SPINE WITHOUT CONTRAST TECHNIQUE: Multidetector CT imaging of the head and cervical spine was performed following the standard protocol without intravenous contrast. Multiplanar CT image reconstructions of the cervical spine were also generated. COMPARISON:  None. FINDINGS: CT HEAD FINDINGS Brain: Mixed density hemorrhage in the right hemisphere is extra-axial and lobulated but probably subdural. Hyperdense septations or developing thrombus within the lobulated but contiguous  collection (sagittal image 16) which measures up to 18-20 millimeters in thickness (series 3, image 19 and coronal image 51). With associated trace leftward midline shift and overall mild mass effect on the right hemisphere. Normal basilar cisterns. No other intracranial hemorrhage identified. Patchy bilateral cerebral white matter hypodensity. No ventriculomegaly. Nonspecific dystrophic calcifications in the pons. No ventriculomegalyor evidence of cortically based acute infarction. No cortical encephalomalacia identified. Vascular: Calcified atherosclerosis at the skull base. No suspicious intracranial vascular hyperdensity. Skull: No skull fracture identified. Sinuses/Orbits: Trace paranasal sinus mucosal thickening. Tympanic cavities and mastoids are clear. Other: Negative orbits. No scalp hematoma identified. CT CERVICAL SPINE FINDINGS Alignment: Straightening of cervical lordosis. Cervicothoracic junction alignment is within normal limits. Bilateral posterior element alignment is within normal limits. Skull base and vertebrae: Visualized skull base is intact. No atlanto-occipital dissociation. No acute osseous abnormality identified. Soft tissues and spinal canal: No prevertebral fluid or swelling. No visible canal hematoma. Negative noncontrast neck soft tissues. Disc levels:  Mild for age cervical spine degeneration. Upper chest: Visible upper thoracic levels appear intact. Negative lung apices and negative noncontrast thoracic inlet. IMPRESSION: 1. Mixed density and lobulated right side subdural hematoma. This is age indeterminate and measures up to 18-20 mm in thickness. Associated mild mass effect on the right hemisphere with trace leftward midline shift. No associated skull fracture identified. 2. Nonspecific cerebral white matter changes, most commonly due to small vessel disease. 3.  No acute traumatic injury identified in the cervical spine. Electronically Signed: By: Genevie Ann M.D. On: 02/01/2019 22:57    Vas US Carotid  Result Date: 02/02/2019 Carotid Arterial Duplex Study Indications: Syncope. Limitations: tortuousity and patient talking/movement Performing Technologist: Abram Sander RVS  Examination Guidelines:  A complete evaluation includes B-mode imaging, spectral Doppler, color Doppler, and power Doppler as needed of all accessible portions of each vessel. Bilateral testing is considered an integral part of a complete examination. Limited examinations for reoccurring indications may be performed as noted.  Right Carotid Findings: +----------+--------+--------+--------+------------+--------------+           PSV cm/sEDV cm/sStenosisDescribe    Comments       +----------+--------+--------+--------+------------+--------------+ CCA Prox  78      11              heterogenous               +----------+--------+--------+--------+------------+--------------+ CCA Distal46      7               heterogenous               +----------+--------+--------+--------+------------+--------------+ ICA Prox  239     49      40-59%  heterogenoustortuous       +----------+--------+--------+--------+------------+--------------+ ICA Mid   95      14                          tortuous       +----------+--------+--------+--------+------------+--------------+ ICA Distal                                    Not visualized +----------+--------+--------+--------+------------+--------------+ ECA       76                                                 +----------+--------+--------+--------+------------+--------------+ +----------+--------+-------+--------+-------------------+           PSV cm/sEDV cmsDescribeArm Pressure (mmHG) +----------+--------+-------+--------+-------------------+ [id number]                                         +----------+--------+-------+--------+-------------------+ +---------+--------+--+--------+---------+ VertebralPSV cm/s53EDV cm/sAntegrade  +---------+--------+--+--------+---------+  Left Carotid Findings: +----------+--------+--------+--------+------------+--------+           PSV cm/sEDV cm/sStenosisDescribe    Comments +----------+--------+--------+--------+------------+--------+ CCA Prox  123     14              heterogenous         +----------+--------+--------+--------+------------+--------+ CCA Distal80      15              heterogenous         +----------+--------+--------+--------+------------+--------+ ICA Prox  127     35      1-39%   heterogenous         +----------+--------+--------+--------+------------+--------+ ICA Distal114     24                                   +----------+--------+--------+--------+------------+--------+ ECA       103                                          +----------+--------+--------+--------+------------+--------+ +----------+--------+--------+--------+-------------------+ SubclavianPSV cm/sEDV cm/sDescribeArm Pressure (mmHG) +----------+--------+--------+--------+-------------------+  92                                          +----------+--------+--------+--------+-------------------+ +---------+--------+--+--------+--+---------+ VertebralPSV cm/s62EDV cm/s12Antegrade +---------+--------+--+--------+--+---------+  Summary: Right Carotid: Velocities in the right ICA are consistent with a 40-59%                stenosis. Left Carotid: Velocities in the left ICA are consistent with a 1-39% stenosis. Vertebrals: Bilateral vertebral arteries demonstrate antegrade flow. *See table(s) above for measurements and observations.  Electronically signed by Deitra Mayo MD on 02/02/2019 at 4:26:52 PM.    Final    US Abdomen Limited Ruq  Result Date: 02/02/2019 CLINICAL DATA:  Elevated liver function tests. History of colon cancer. EXAM: ULTRASOUND ABDOMEN LIMITED RIGHT UPPER QUADRANT COMPARISON:  None. FINDINGS: Gallbladder: No gallstones or  wall thickening visualized. No sonographic Murphy sign noted by sonographer. Common bile duct: Diameter: 5 mm Liver: No focal lesion identified. Normal parenchymal echogenicity. Portal vein is patent on color Doppler imaging with normal direction of blood flow towards the liver. IMPRESSION: Negative RIGHT upper quadrant ultrasound. Electronically Signed   By: Elon Alas M.D.   On: 02/02/2019 03:10   (Echo, Carotid)    Subjective: Resting well.  sister at the bedside. New complaints.  Patient is ready to go to skilled nursing facility.  Patient denies any chest pain, lightheadedness, nausea vomiting.  Discharge Exam: Vitals:   02/05/19 1000 02/05/19 1244  BP:  115/74  Pulse:  80  Resp: (!) 25 (!) 25  Temp:  98 F (36.7 C)  SpO2:  98%   Vitals:   02/05/19 0422 02/05/19 0900 02/05/19 1000 02/05/19 1244  BP: 127/69   115/74  Pulse: 69   80  Resp:  (!) 36 (!) 25 (!) 25  Temp: 97.7 F (36.5 C)   98 F (36.7 C)  TempSrc: Axillary   Oral  SpO2: 98%   98%  Weight:      Height:        General: Pt is alert, awake, not in acute distress Cardiovascular: RRR, S1/S2 +, no rubs, no gallops Respiratory: CTA bilaterally, no wheezing, no rhonchi Abdominal: Soft, NT, ND, bowel sounds + Extremities: no edema, no cyanosis   The results of significant diagnostics from this hospitalization (including imaging, microbiology, ancillary and laboratory) are listed below for reference.     Microbiology: No results found for this or any previous visit (from the past 240 hour(s)).   Labs: BNP (last 3 results) No results for input(s): BNP in the last 8760 hours. Basic Metabolic Panel: Recent Labs  Lab 02/01/19 2237 02/02/19 0853 02/03/19 0417  NA 139 140 141  K 5.1 3.4* 3.4*  CL 107 109 109  CO2 24 23 27   GLUCOSE 119* 118* 100*  BUN 16 13 11   CREATININE 0.83 0.84 0.86  CALCIUM 10.1 9.8 9.8  MG  --  2.2  --    Liver Function Tests: Recent Labs  Lab 02/01/19 2237  02/03/19 0417  AST 70* 38  ALT 18 9  ALKPHOS 74 66  BILITOT 2.6* 1.1  PROT 8.4* 6.4*  ALBUMIN 4.5 3.3*   No results for input(s): LIPASE, AMYLASE in the last 168 hours. No results for input(s): AMMONIA in the last 168 hours. CBC: Recent Labs  Lab 02/01/19 2237 02/03/19 0417  WBC 10.2 7.1  NEUTROABS 8.1* 5.0  HGB 14.7  12.8*  HCT 43.5 38.5*  MCV 93.8 92.1  PLT 243 221   Cardiac Enzymes: Recent Labs  Lab 02/01/19 2237 02/03/19 0417 02/04/19 0407  CKTOTAL 2,852* 737* 371   BNP: Invalid input(s): POCBNP CBG: Recent Labs  Lab 02/04/19 1205 02/04/19 1729 02/04/19 2202 02/05/19 0640 02/05/19 1240  GLUCAP 121* 149* 154* 146* 165*   D-Dimer No results for input(s): DDIMER in the last 72 hours. Hgb A1c Recent Labs    02/03/19 0417  HGBA1C 5.5   Lipid Profile No results for input(s): CHOL, HDL, LDLCALC, TRIG, CHOLHDL, LDLDIRECT in the last 72 hours. Thyroid function studies No results for input(s): TSH, T4TOTAL, T3FREE, THYROIDAB in the last 72 hours.  Invalid input(s): FREET3 Anemia work up No results for input(s): VITAMINB12, FOLATE, FERRITIN, TIBC, IRON, RETICCTPCT in the last 72 hours. Urinalysis No results found for: COLORURINE, APPEARANCEUR, LABSPEC, [place], GLUCOSEU, HGBUR, BILIRUBINUR, KETONESUR, PROTEINUR, UROBILINOGEN, NITRITE, LEUKOCYTESUR Sepsis Labs Invalid input(s): PROCALCITONIN,  WBC,  LACTICIDVEN Microbiology No results found for this or any previous visit (from the past 240 hour(s)).   Time coordinating discharge: 25 minutes  SIGNED:   Antonieta Pert, MD  [facility] 02/05/2019, 2:48 PM  If 7PM-7AM, please contact night-coverage [email or web address]

## 2019-02-05 NOTE — Plan of Care (Signed)
  Problem: Education: Goal: Knowledge of disease or condition will improve Outcome: Adequate for Discharge Goal: Knowledge of secondary prevention will improve Outcome: Adequate for Discharge Goal: Knowledge of patient specific risk factors addressed and post discharge goals established will improve Outcome: Adequate for Discharge Goal: Individualized Educational Video(s) Outcome: Adequate for Discharge   Problem: Coping: Goal: Will verbalize positive feelings about self Outcome: Adequate for Discharge Goal: Will identify appropriate support needs Outcome: Adequate for Discharge   Problem: Health Behavior/Discharge Planning: Goal: Ability to manage health-related needs will improve Outcome: Adequate for Discharge   Problem: Self-Care: Goal: Ability to participate in self-care as condition permits will improve Outcome: Adequate for Discharge Goal: Verbalization of feelings and concerns over difficulty with self-care will improve Outcome: Adequate for Discharge Goal: Ability to communicate needs accurately will improve Outcome: Adequate for Discharge

## 2019-02-05 NOTE — TOC Transition Note (Signed)
Nurse to call report to [phone], Room 102    Transition of Care [facility]) - CM/SW Discharge Note   Patient Details  Name: KEILAND PICKERING MRN: [id number] Date of Birth: [birth year]  Transition of Care [facility]) CM/SW Contact:  Geralynn Ochs, LCSW Phone Number: 02/05/2019, 3:15 PM   Clinical Narrative:       Final next level of care: Skilled Nursing Facility Barriers to Discharge: No Barriers Identified   Patient Goals and CMS Choice Patient states their goals for this hospitalization and ongoing recovery are:: go home on my own CMS Medicare.gov Compare Post Acute Care list provided to:: Patient Represenative (must comment)(patient wanted his sister to review) Choice offered to / list presented to : Patient, [facility] POA / Guardian  Discharge Placement PASRR number recieved: 02/03/19            Patient chooses bed at: [place], [place] Patient to be transferred to facility by: [place] Name of family member notified: Self, sisters Patient and family notified of of transfer: 02/05/19  Discharge Plan and Services   Post Acute Care Choice: [place]                    Social Determinants of Health (SDOH) Interventions     Readmission Risk Interventions No flowsheet data found.

## 2019-02-05 NOTE — Consult Note (Signed)
ELECTROPHYSIOLOGY CONSULT NOTE    Patient ID: Keith Pittman MRN: [id number], DOB/AGE: Oct 29, 1932 85 y.o.  Admit date: 02/01/2019 Date of Consult: 02/05/2019  Primary Physician: Lavone Orn, MD Electrophysiologist: Caryl Comes (new this admission)  Patient Profile: Keith Pittman is a 85 y.o. male with a history of colon cancer and diabetes who is being seen today for the evaluation of syncope/bradycardia at the request of Dr Lupita Leash.  HPI:  Keith Pittman is a 85 y.o. male with no significant past cardiac history. He reports he has had several falls over the last few years. He is unclear on the details. The day of admission, he got up to use the restroom. He usually sits on the side of the bed before starting to walk and did so this time. He was walking to the bathroom and "things went blank" for a couple of seconds. He awoke on the floor after some period of time. His sister called but couldn't reach him by phone and went to check on him. He was found on the floor. He was helped to the bed and EMS was called and transported him to the hospital. He was found to have a subdural hematoma that has been managed conservatively. Telemetry has demonstrated sinus rhythm/sinus brady/occasional bigeminal PVC's. Echo was done and normal. EP has been asked to evaluate. He plans to go to SNF at discharge.   He currently denies chest pain, palpitations, dyspnea, PND, orthopnea, nausea, vomiting, dizziness, syncope, edema, weight gain, or early satiety.  Past Medical History:  Diagnosis Date   Cancer ([place])    colon   Diabetes mellitus without complication ([place])      Surgical History:  Past Surgical History:  Procedure Laterality Date   COLON SURGERY       Medications Prior to Admission  Medication Sig Dispense Refill Last Dose   acetaminophen (TYLENOL) 325 MG tablet Take 650 mg by mouth every 6 (six) hours as needed.   Past Week at Unknown time   bismuth subsalicylate (PEPTO BISMOL) 262 MG/15ML  suspension Take 30 mLs by mouth every 6 (six) hours as needed for indigestion.   Past Month at Unknown time    Inpatient Medications:   dexamethasone  4 mg Intravenous V2Z   folic acid  1 mg Oral Daily   insulin aspart  0-9 Units Subcutaneous TID WC   multivitamin with minerals  1 tablet Oral Daily   thiamine  100 mg Oral Daily   Or   thiamine  100 mg Intravenous Daily    Allergies:  Allergies  Allergen Reactions   Sulfa Antibiotics     Sulfa containing drugs     Social History   Socioeconomic History   Marital status: Single    Spouse name: Not on file   Number of children: Not on file   Years of education: Not on file   Highest education level: Not on file  Occupational History   Not on file  Social Needs   Financial resource strain: Not on file   Food insecurity:    Worry: Not on file    Inability: Not on file   Transportation needs:    Medical: Not on file    Non-medical: Not on file  Tobacco Use   Smoking status: Former Smoker   Smokeless tobacco: Never Used  Substance and Sexual Activity   Alcohol use: Yes   Drug use: Not on file   Sexual activity: Not on file  Lifestyle   Physical activity:  Days per week: Not on file    Minutes per session: Not on file   Stress: Not on file  Relationships   Social connections:    Talks on phone: Not on file    Gets together: Not on file    Attends religious service: Not on file    Active member of club or organization: Not on file    Attends meetings of clubs or organizations: Not on file    Relationship status: Not on file   Intimate partner violence:    Fear of current or ex partner: Not on file    Emotionally abused: Not on file    Physically abused: Not on file    Forced sexual activity: Not on file  Other Topics Concern   Not on file  Social History Narrative   Not on file     No family history on file.   Review of Systems: All other systems reviewed and are otherwise  negative except as noted above.  Physical Exam: Vitals:   02/05/19 0422 02/05/19 0900 02/05/19 1000 02/05/19 1244  BP: 127/69   115/74  Pulse: 69   80  Resp:  (!) 36 (!) 25 (!) 25  Temp: 97.7 F (36.5 C)   98 F (36.7 C)  TempSrc: Axillary   Oral  SpO2: 98%   98%  Weight:      Height:        GEN- The patient is elderly appearing, alert and oriented x 3 today, frequently repeating nformation.   HEENT: normocephalic, atraumatic; sclera clear, conjunctiva pink; hearing intact; oropharynx clear; neck supple Lungs- Clear to ausculation bilaterally, normal work of breathing.  No wheezes, rales, rhonchi Heart- Regular rate and rhythm  GI- soft, non-tender, non-distended, bowel sounds present Extremities- no clubbing, cyanosis, or edema MS- no significant deformity or atrophy Skin- warm and dry, no rash or lesion Psych- euthymic mood, full affect Neuro- strength and sensation are intact  Labs:   Lab Results  Component Value Date   WBC 7.1 02/03/2019   HGB 12.8 (L) 02/03/2019   HCT 38.5 (L) 02/03/2019   MCV 92.1 02/03/2019   PLT 221 02/03/2019    Recent Labs  Lab 02/03/19 0417  NA 141  K 3.4*  CL 109  CO2 27  BUN 11  CREATININE 0.86  CALCIUM 9.8  PROT 6.4*  BILITOT 1.1  ALKPHOS 66  ALT 9  AST 38  GLUCOSE 100*      Radiology/Studies: Ct Head Wo Contrast  Result Date: 02/04/2019 CLINICAL DATA:  85 y/o  M; follow-up of subdural hematoma. EXAM: CT HEAD WITHOUT CONTRAST TECHNIQUE: Contiguous axial images were obtained from the base of the skull through the vertex without intravenous contrast. COMPARISON:  02/02/2019 CT head. FINDINGS: Brain: Stable extensive subdural hematoma over the right cerebral convexity with membrane formation measuring up to 21 mm over the frontal lobe. Stable low-attenuation subdural hematoma over the left cerebral convexity measuring up to 6 mm over the left frontal lobe. Stable associated mass effect with minimal 1-2 mm right-to-left midline  shift. No new acute intracranial hemorrhage, extra-axial collection, hydrocephalus, or herniation. Nonspecific white matter hypodensities are stable and compatible with chronic microvascular ischemic changes. Stable volume loss of the brain. Stable nonspecific calcification within the left hemi pons. Vascular: Calcific atherosclerosis of the carotid siphons. No hyperdense vessel identified. Skull: Normal. Negative for fracture or focal lesion. Sinuses/Orbits: No acute finding. Small left maxillary sinus mucous retention cyst. Other: None. IMPRESSION: 1. Stable right larger  than left subdural hematoma over the cerebral convexities. Stable associated mass effect with minimal right-to-left midline shift. 2. Stable moderate to severe chronic microvascular ischemic changes and volume loss of the brain. 3. No new acute intracranial abnormality identified. Electronically Signed   By: Kristine Garbe M.D.   On: 02/04/2019 05:44   Ct Head Wo Contrast  Result Date: 02/02/2019 CLINICAL DATA:  Follow up subdural hematoma. EXAM: CT HEAD WITHOUT CONTRAST TECHNIQUE: Contiguous axial images were obtained from the base of the skull through the vertex without intravenous contrast. COMPARISON:  CT HEAD February 01, 2019 FINDINGS: BRAIN: Increased size of RIGHT holo hemispheric mixed density subdural hematoma, increased blood products from prior CT. Dominant component measures 22 mm, previously 20. 2 mm RIGHT to LEFT midline shift. LEFT holo hemispheric subdural hematoma measuring to 6 mm has increased. No hydrocephalus. No intraparenchymal hemorrhage. No acute large vascular territory infarcts. Patchy to confluent supratentorial white matter hypodensities. Pontine calcifications associated with vascular lesions. Basal cisterns are patent. VASCULAR: Moderate calcific atherosclerosis of the carotid siphons. Bulbous appearance of LEFT carotid canal seen with tortuosity or aneurysm, unchanged. SKULL: No skull fracture. No  significant scalp soft tissue swelling. SINUSES/ORBITS: Mild lobulated paranasal sinus mucosal thickening. Mastoid air cells are well aerated.The included ocular globes and orbital contents are non-suspicious. OTHER: None. IMPRESSION: 1. Increased size of mixed density RIGHT holo hemispheric subdural hematoma with membrane formation. Increased 6 mm LEFT mixed density subdural hematoma. No midline shift. 2. Moderate to severe chronic small vessel ischemic changes. Electronically Signed   By: Elon Alas M.D.   On: 02/02/2019 06:54   Ct Head Wo Contrast  Addendum Date: 02/01/2019   ADDENDUM REPORT: 02/01/2019 23:10 ADDENDUM: Critical Value/emergent results were called by telephone at the time of interpretation on 02/01/2019 at 2303 hours to [facility] , who verbally acknowledged these results. Electronically Signed   By: Genevie Ann M.D.   On: 02/01/2019 23:10   Result Date: 02/01/2019 CLINICAL DATA:  85 year old male status post unwitnessed fall. EXAM: CT HEAD WITHOUT CONTRAST CT CERVICAL SPINE WITHOUT CONTRAST TECHNIQUE: Multidetector CT imaging of the head and cervical spine was performed following the standard protocol without intravenous contrast. Multiplanar CT image reconstructions of the cervical spine were also generated. COMPARISON:  None. FINDINGS: CT HEAD FINDINGS Brain: Mixed density hemorrhage in the right hemisphere is extra-axial and lobulated but probably subdural. Hyperdense septations or developing thrombus within the lobulated but contiguous collection (sagittal image 16) which measures up to 18-20 millimeters in thickness (series 3, image 19 and coronal image 51). With associated trace leftward midline shift and overall mild mass effect on the right hemisphere. Normal basilar cisterns. No other intracranial hemorrhage identified. Patchy bilateral cerebral white matter hypodensity. No ventriculomegaly. Nonspecific dystrophic calcifications in the pons. No ventriculomegalyor evidence of  cortically based acute infarction. No cortical encephalomalacia identified. Vascular: Calcified atherosclerosis at the skull base. No suspicious intracranial vascular hyperdensity. Skull: No skull fracture identified. Sinuses/Orbits: Trace paranasal sinus mucosal thickening. Tympanic cavities and mastoids are clear. Other: Negative orbits. No scalp hematoma identified. CT CERVICAL SPINE FINDINGS Alignment: Straightening of cervical lordosis. Cervicothoracic junction alignment is within normal limits. Bilateral posterior element alignment is within normal limits. Skull base and vertebrae: Visualized skull base is intact. No atlanto-occipital dissociation. No acute osseous abnormality identified. Soft tissues and spinal canal: No prevertebral fluid or swelling. No visible canal hematoma. Negative noncontrast neck soft tissues. Disc levels:  Mild for age cervical spine degeneration. Upper chest: Visible upper thoracic levels appear intact.  Negative lung apices and negative noncontrast thoracic inlet. IMPRESSION: 1. Mixed density and lobulated right side subdural hematoma. This is age indeterminate and measures up to 18-20 mm in thickness. Associated mild mass effect on the right hemisphere with trace leftward midline shift. No associated skull fracture identified. 2. Nonspecific cerebral white matter changes, most commonly due to small vessel disease. 3.  No acute traumatic injury identified in the cervical spine. Electronically Signed: By: Genevie Ann M.D. On: 02/01/2019 22:57   Ct Cervical Spine Wo Contrast  Addendum Date: 02/01/2019   ADDENDUM REPORT: 02/01/2019 23:10 ADDENDUM: Critical Value/emergent results were called by telephone at the time of interpretation on 02/01/2019 at 2303 hours to [facility] , who verbally acknowledged these results. Electronically Signed   By: Genevie Ann M.D.   On: 02/01/2019 23:10   Result Date: 02/01/2019 CLINICAL DATA:  85 year old male status post unwitnessed fall. EXAM: CT HEAD  WITHOUT CONTRAST CT CERVICAL SPINE WITHOUT CONTRAST TECHNIQUE: Multidetector CT imaging of the head and cervical spine was performed following the standard protocol without intravenous contrast. Multiplanar CT image reconstructions of the cervical spine were also generated. COMPARISON:  None. FINDINGS: CT HEAD FINDINGS Brain: Mixed density hemorrhage in the right hemisphere is extra-axial and lobulated but probably subdural. Hyperdense septations or developing thrombus within the lobulated but contiguous collection (sagittal image 16) which measures up to 18-20 millimeters in thickness (series 3, image 19 and coronal image 51). With associated trace leftward midline shift and overall mild mass effect on the right hemisphere. Normal basilar cisterns. No other intracranial hemorrhage identified. Patchy bilateral cerebral white matter hypodensity. No ventriculomegaly. Nonspecific dystrophic calcifications in the pons. No ventriculomegalyor evidence of cortically based acute infarction. No cortical encephalomalacia identified. Vascular: Calcified atherosclerosis at the skull base. No suspicious intracranial vascular hyperdensity. Skull: No skull fracture identified. Sinuses/Orbits: Trace paranasal sinus mucosal thickening. Tympanic cavities and mastoids are clear. Other: Negative orbits. No scalp hematoma identified. CT CERVICAL SPINE FINDINGS Alignment: Straightening of cervical lordosis. Cervicothoracic junction alignment is within normal limits. Bilateral posterior element alignment is within normal limits. Skull base and vertebrae: Visualized skull base is intact. No atlanto-occipital dissociation. No acute osseous abnormality identified. Soft tissues and spinal canal: No prevertebral fluid or swelling. No visible canal hematoma. Negative noncontrast neck soft tissues. Disc levels:  Mild for age cervical spine degeneration. Upper chest: Visible upper thoracic levels appear intact. Negative lung apices and negative  noncontrast thoracic inlet. IMPRESSION: 1. Mixed density and lobulated right side subdural hematoma. This is age indeterminate and measures up to 18-20 mm in thickness. Associated mild mass effect on the right hemisphere with trace leftward midline shift. No associated skull fracture identified. 2. Nonspecific cerebral white matter changes, most commonly due to small vessel disease. 3.  No acute traumatic injury identified in the cervical spine. Electronically Signed: By: Genevie Ann M.D. On: 02/01/2019 22:57   Vas US Carotid  Result Date: 02/02/2019 Carotid Arterial Duplex Study Indications: Syncope. Limitations: tortuousity and patient talking/movement Performing Technologist: Abram Sander RVS  Examination Guidelines: A complete evaluation includes B-mode imaging, spectral Doppler, color Doppler, and power Doppler as needed of all accessible portions of each vessel. Bilateral testing is considered an integral part of a complete examination. Limited examinations for reoccurring indications may be performed as noted.  Right Carotid Findings: +----------+--------+--------+--------+------------+--------------+             PSV cm/s EDV cm/s Stenosis Describe     Comments        +----------+--------+--------+--------+------------+--------------+  CCA Prox   78       11                heterogenous                 +----------+--------+--------+--------+------------+--------------+  CCA Distal 46       7                 heterogenous                 +----------+--------+--------+--------+------------+--------------+  ICA Prox   239      49       40-59%   heterogenous tortuous        +----------+--------+--------+--------+------------+--------------+  ICA Mid    95       14                             tortuous        +----------+--------+--------+--------+------------+--------------+  ICA Distal                                         Not visualized  +----------+--------+--------+--------+------------+--------------+  ECA         76                                                      +----------+--------+--------+--------+------------+--------------+ +----------+--------+-------+--------+-------------------+             PSV cm/s EDV cms Describe Arm Pressure (mmHG)  +----------+--------+-------+--------+-------------------+  Subclavian 90                                             +----------+--------+-------+--------+-------------------+ +---------+--------+--+--------+---------+  Vertebral PSV cm/s 53 EDV cm/s Antegrade  +---------+--------+--+--------+---------+  Left Carotid Findings: +----------+--------+--------+--------+------------+--------+             PSV cm/s EDV cm/s Stenosis Describe     Comments  +----------+--------+--------+--------+------------+--------+  CCA Prox   123      14                heterogenous           +----------+--------+--------+--------+------------+--------+  CCA Distal 80       15                heterogenous           +----------+--------+--------+--------+------------+--------+  ICA Prox   127      35       1-39%    heterogenous           +----------+--------+--------+--------+------------+--------+  ICA Distal 114      24                                       +----------+--------+--------+--------+------------+--------+  ECA        103                                               +----------+--------+--------+--------+------------+--------+ +----------+--------+--------+--------+-------------------+  Subclavian PSV cm/s EDV cm/s Describe Arm Pressure (mmHG)  +----------+--------+--------+--------+-------------------+             92                                              +----------+--------+--------+--------+-------------------+ +---------+--------+--+--------+--+---------+  Vertebral PSV cm/s 62 EDV cm/s 12 Antegrade  +---------+--------+--+--------+--+---------+  Summary: Right Carotid: Velocities in the right ICA are consistent with a 40-59%                stenosis. Left Carotid:  Velocities in the left ICA are consistent with a 1-39% stenosis. Vertebrals: Bilateral vertebral arteries demonstrate antegrade flow. *See table(s) above for measurements and observations.  Electronically signed by Deitra Mayo MD on 02/02/2019 at 4:26:52 PM.    Final    US Abdomen Limited Ruq  Result Date: 02/02/2019 CLINICAL DATA:  Elevated liver function tests. History of colon cancer. EXAM: ULTRASOUND ABDOMEN LIMITED RIGHT UPPER QUADRANT COMPARISON:  None. FINDINGS: Gallbladder: No gallstones or wall thickening visualized. No sonographic Murphy sign noted by sonographer. Common bile duct: Diameter: 5 mm Liver: No focal lesion identified. Normal parenchymal echogenicity. Portal vein is patent on color Doppler imaging with normal direction of blood flow towards the liver. IMPRESSION: Negative RIGHT upper quadrant ultrasound. Electronically Signed   By: Elon Alas M.D.   On: 02/02/2019 03:10    BXI:DHWYS bradycardia, rate 55 (personally reviewed)  TELEMETRY: sinus rhythm, sinus brady, PVC's (personally reviewed)   Assessment/Plan: 1.  Falls/?syncope The patient has a history of falls and possible syncope. His most recent episode he recalls a period of "things going blank" before the fall. However, his persistent symptoms afterwards would argue against arrhythmic cause I think a 30 day monitor would be reasonable to rule out brady arrhythmias. Reviewed with patient and sisters today. We will arrange  Dr Caryl Comes to see later today   [facility] will sign off.   Medication Recommendations:  none Other recommendations (labs, testing, etc):  Outpatient event monitor Follow up as an outpatient:  Prn   For questions or updates, please contact [place] Please consult [email or web address] for contact info under Cardiology/STEMI.  Signed, Chanetta Marshall, NP 02/05/2019 2:31 PM

## 2019-02-05 NOTE — Progress Notes (Signed)
Physical Therapy Treatment Patient Details Name: Keith Pittman MRN: [id number] DOB: Feb 02, 1932 Today's Date: 02/05/2019    History of Present Illness Pt is a 85 y/o male with PMH of colon cancer, type 2 diabetes presenting to hospital for evaluation after unwitnessed fall at home. Pt unable to recall how he fell, found in bedroom floor conscious. CT reveals: mixed density and lobulated right-sided subdural hematoma, age indeterminate, measuring up to 18 to 20 mm in thickness with associated mild mass-effect on the right hemisphere with trace leftward midline shift.     PT Comments    Patient progressing slowly towards PT goals. Requires Mod A for standing with posterior lean and LOB onto bed x2 with max cues for technique/safety. Tolerated gait training with min-mod A for balance/safety as pt with poor advancement of LLE esp when fatigued, bil knee instability and left lateral lean. Poor awareness of deficits/safety for all mobility. High fall risk. Not able to self monitor symptoms/weakness very well and needs forced rest breaks prior to legs giving out. Sister present during session. Continue to recommend SNF. Will follow.    Follow Up Recommendations  SNF;Supervision for mobility/OOB     Equipment Recommendations  Rolling walker with 5" wheels    Recommendations for Other Services       Precautions / Restrictions Precautions Precautions: Fall Restrictions Weight Bearing Restrictions: No    Mobility  Bed Mobility Overal bed mobility: Needs Assistance Bed Mobility: Supine to Sit     Supine to sit: Min assist;HOB elevated     General bed mobility comments: Use of rail and increased time to get to EOB.  Transfers Overall transfer level: Needs assistance Equipment used: Rolling walker (2 wheeled) Transfers: Sit to/from Stand Sit to Stand: Mod assist;From elevated surface         General transfer comment: Assist to power to standing with cues for hand  placment/technique, posterior LOB and LOBx3 upon standing. Stood from [employer] x3, from chair x2.   Ambulation/Gait Ambulation/Gait assistance: Mod assist;Min assist Gait Distance (Feet): 50 Feet(+25' +15') Assistive device: Rolling walker (2 wheeled) Gait Pattern/deviations: Step-to pattern;Step-through pattern;Decreased stance time - left;Trunk flexed;Decreased stride length Gait velocity: decreased   General Gait Details: Slow, unsteady gait with forward flexed posture; decreased foot clearance LLE esp when fatigued. Cues for RW proximity/management. 2 seated rest breaks forced by PT due to legs giving way.   Stairs             Wheelchair Mobility    Modified Rankin (Stroke Patients Only) Modified Rankin (Stroke Patients Only) Pre-Morbid Rankin Score: Slight disability Modified Rankin: Moderately severe disability     Balance Overall balance assessment: Needs assistance;History of Falls Sitting-balance support: Feet supported;No upper extremity supported Sitting balance-Leahy Scale: Fair     Standing balance support: During functional activity;Bilateral upper extremity supported Standing balance-Leahy Scale: Poor Standing balance comment: reliant on BUE support                            Cognition Arousal/Alertness: Awake/alert Behavior During Therapy: Impulsive;Flat affect Overall Cognitive Status: Impaired/Different from baseline Area of Impairment: Attention;Orientation;Memory;Following commands;Safety/judgement;Awareness                 Orientation Level: Disoriented to;Situation;Time Current Attention Level: Sustained Memory: Decreased short-term memory Following Commands: Follows one step commands inconsistently;Follows one step commands with increased time Safety/Judgement: Decreased awareness of deficits;Decreased awareness of safety Awareness: Emergent Problem Solving: Slow processing;Decreased initiation;Difficulty sequencing;Requires  verbal  cues General Comments: poor awareness to deficits and need for assist. Requires repetition of cues to follow 1 step commands with increased time. Difficulty problem solving during mobility.       Exercises      General Comments General comments (skin integrity, edema, etc.): Sister present during session.      Pertinent Vitals/Pain Pain Assessment: No/denies pain    Home Living                      Prior Function            PT Goals (current goals can now be found in the care plan section) Progress towards PT goals: Progressing toward goals    Frequency    Min 3X/week      PT Plan Current plan remains appropriate    Co-evaluation              AM-PAC PT "6 Clicks" Mobility   Outcome Measure  Help needed turning from your back to your side while in a flat bed without using bedrails?: A Little Help needed moving from lying on your back to sitting on the side of a flat bed without using bedrails?: A Lot Help needed moving to and from a bed to a chair (including a wheelchair)?: A Lot Help needed standing up from a chair using your arms (e.g., wheelchair or bedside chair)?: A Lot Help needed to walk in hospital room?: A Lot Help needed climbing 3-5 steps with a railing? : Total 6 Click Score: 12    End of Session Equipment Utilized During Treatment: Gait belt Activity Tolerance: Patient tolerated treatment well Patient left: in chair;with call bell/phone within reach;with family/visitor present(sister present) Nurse Communication: Mobility status PT Visit Diagnosis: Unsteadiness on feet (R26.81);Difficulty in walking, not elsewhere classified (R26.2)     Time: 1202-1226 PT Time Calculation (min) (ACUTE ONLY): 24 min  Charges:  $Gait Training: 8-22 mins $Therapeutic Activity: 8-22 mins                     [name], [place], DPT Acute Rehabilitation Services Pager [phone] Office [place] 02/05/2019, 12:37 PM

## 2019-02-16 ENCOUNTER — Other Ambulatory Visit (HOSPITAL_COMMUNITY): Payer: Self-pay | Admitting: Neurosurgery

## 2019-02-16 ENCOUNTER — Other Ambulatory Visit: Payer: Self-pay | Admitting: Neurosurgery

## 2019-02-16 DIAGNOSIS — S065X9A Traumatic subdural hemorrhage with loss of consciousness of unspecified duration, initial encounter: Secondary | ICD-10-CM

## 2019-02-16 DIAGNOSIS — S065XAA Traumatic subdural hemorrhage with loss of consciousness status unknown, initial encounter: Secondary | ICD-10-CM

## 2019-02-25 ENCOUNTER — Other Ambulatory Visit: Payer: Self-pay | Admitting: *Deleted

## 2019-02-25 NOTE — Patient Outreach (Signed)
[facility]) Care Management  02/25/2019  Keith Pittman [birth year] [id number]   Patient screened for potential [facility] Care Management services while at [facility].   Collaboration with [place]. Writer informed that Mr. Croft will remain at SNF under private pay with plans to transition to LTC.   Therefore there are no identifiable [facility] Care Management needs at this time.   Marthenia Rolling, MSN-Ed, RN,BSN [place] Acute Care Coordinator [phone]

## 2019-03-07 DIAGNOSIS — I6521 Occlusion and stenosis of right carotid artery: Secondary | ICD-10-CM | POA: Diagnosis not present

## 2019-03-07 DIAGNOSIS — F419 Anxiety disorder, unspecified: Secondary | ICD-10-CM | POA: Diagnosis not present

## 2019-03-07 DIAGNOSIS — F05 Delirium due to known physiological condition: Secondary | ICD-10-CM | POA: Diagnosis not present

## 2019-03-07 DIAGNOSIS — E1151 Type 2 diabetes mellitus with diabetic peripheral angiopathy without gangrene: Secondary | ICD-10-CM | POA: Diagnosis not present

## 2019-03-15 ENCOUNTER — Ambulatory Visit (HOSPITAL_COMMUNITY)
Admission: RE | Admit: 2019-03-15 | Discharge: 2019-03-15 | Disposition: A | Discharge status: Home / Self Care | Payer: Medicare Other | Source: Ambulatory Visit | Attending: Neurosurgery | Admitting: Neurosurgery

## 2019-03-15 ENCOUNTER — Other Ambulatory Visit: Payer: Self-pay

## 2019-03-15 DIAGNOSIS — S065X9A Traumatic subdural hemorrhage with loss of consciousness of unspecified duration, initial encounter: Secondary | ICD-10-CM | POA: Diagnosis not present

## 2019-03-15 DIAGNOSIS — S065XAA Traumatic subdural hemorrhage with loss of consciousness status unknown, initial encounter: Secondary | ICD-10-CM

## 2019-03-15 DIAGNOSIS — I6200 Nontraumatic subdural hemorrhage, unspecified: Secondary | ICD-10-CM | POA: Diagnosis not present

## 2019-03-22 DIAGNOSIS — F0390 Unspecified dementia without behavioral disturbance: Secondary | ICD-10-CM | POA: Diagnosis not present

## 2019-03-22 DIAGNOSIS — S065X9A Traumatic subdural hemorrhage with loss of consciousness of unspecified duration, initial encounter: Secondary | ICD-10-CM | POA: Diagnosis not present

## 2019-03-22 DIAGNOSIS — M79675 Pain in left toe(s): Secondary | ICD-10-CM | POA: Diagnosis not present

## 2019-03-22 DIAGNOSIS — E039 Hypothyroidism, unspecified: Secondary | ICD-10-CM | POA: Diagnosis not present

## 2019-04-01 ENCOUNTER — Other Ambulatory Visit: Payer: Self-pay

## 2019-04-01 ENCOUNTER — Encounter: Payer: Self-pay | Admitting: Podiatry

## 2019-04-01 ENCOUNTER — Ambulatory Visit (INDEPENDENT_AMBULATORY_CARE_PROVIDER_SITE_OTHER): Payer: Medicare Other | Admitting: Podiatry

## 2019-04-01 VITALS — Temp 97.5°F

## 2019-04-01 DIAGNOSIS — E1149 Type 2 diabetes mellitus with other diabetic neurological complication: Secondary | ICD-10-CM | POA: Diagnosis not present

## 2019-04-01 DIAGNOSIS — M79675 Pain in left toe(s): Secondary | ICD-10-CM | POA: Diagnosis not present

## 2019-04-01 DIAGNOSIS — B351 Tinea unguium: Secondary | ICD-10-CM | POA: Diagnosis not present

## 2019-04-01 DIAGNOSIS — M79674 Pain in right toe(s): Secondary | ICD-10-CM | POA: Diagnosis not present

## 2019-04-05 NOTE — Progress Notes (Signed)
Subjective:   Patient ID: Keith Pittman, male   DOB: 85 y.o.   MRN: [id number]   HPI 85 year old male presents the office today for concerns of thick, painful, elongated toenails that he cannot trim himself.  He states the nails are very long and cause irritation inside of his shoes.  Denies any redness or drainage coming from the toenail sites.  He has no other concerns today.  Last A1c 5.5  Review of Systems  All other systems reviewed and are negative.  Past Medical History:  Diagnosis Date  . Cancer ([place])    colon  . Diabetes mellitus without complication [facility])     Past Surgical History:  Procedure Laterality Date  . COLON SURGERY       Current Outpatient Medications:  .  acetaminophen (TYLENOL) 325 MG tablet, Take 650 mg by mouth every 6 (six) hours as needed., Disp: , Rfl:  .  bismuth subsalicylate (PEPTO BISMOL) 262 MG/15ML suspension, Take 30 mLs by mouth every 6 (six) hours as needed for indigestion., Disp: , Rfl:   Allergies  Allergen Reactions  . Sulfa Antibiotics     Sulfa containing drugs          Objective:  Physical Exam  General: NAD  Dermatological: Nails are hypertrophic, dystrophic, brittle, discolored, and very elongated 10. No surrounding redness or drainage. Tenderness nails 1-5 bilaterally. No open lesions or pre-ulcerative lesions are identified today.  Vascular: DP 2/4, PT 1/4, CRT < 3 seconds.  Denies any claudication symptoms.  Neruologic: Sensation mildly decreased with Semmes Weinstein monofilament.  Musculoskeletal: No pain, crepitus, or limitation noted with foot and ankle range of motion bilateral. Muscular strength 5/5 in all groups tested bilateral.  Assessment:   Symptomatic onychomycosis    Plan:  -Treatment options discussed including all alternatives, risks, and complications -Etiology of symptoms were discussed -Nails debrided 10 without complications or bleeding. -Daily foot inspection -Follow-up in 3 months or  sooner if any problems arise. In the meantime, encouraged to call the office with any questions, concerns, change in symptoms.   Celesta Gentile, DPM

## 2019-06-30 ENCOUNTER — Ambulatory Visit: Payer: TRICARE For Life (TFL) | Admitting: Podiatry

## 2019-09-28 ENCOUNTER — Observation Stay (HOSPITAL_COMMUNITY): Payer: Medicare Other

## 2019-09-28 ENCOUNTER — Encounter (HOSPITAL_COMMUNITY): Payer: Self-pay

## 2019-09-28 ENCOUNTER — Other Ambulatory Visit: Payer: Self-pay

## 2019-09-28 ENCOUNTER — Inpatient Hospital Stay (HOSPITAL_COMMUNITY)
Admission: EM | Admit: 2019-09-28 | Discharge: 2019-10-04 | DRG: 690 | Disposition: A | Discharge status: Skilled Nursing Facility | Payer: Medicare Other | Attending: Family Medicine | Admitting: Family Medicine

## 2019-09-28 DIAGNOSIS — Z85038 Personal history of other malignant neoplasm of large intestine: Secondary | ICD-10-CM | POA: Diagnosis not present

## 2019-09-28 DIAGNOSIS — Z03818 Encounter for observation for suspected exposure to other biological agents ruled out: Secondary | ICD-10-CM | POA: Diagnosis not present

## 2019-09-28 DIAGNOSIS — Z882 Allergy status to sulfonamides status: Secondary | ICD-10-CM | POA: Diagnosis not present

## 2019-09-28 DIAGNOSIS — Z87820 Personal history of traumatic brain injury: Secondary | ICD-10-CM

## 2019-09-28 DIAGNOSIS — R4182 Altered mental status, unspecified: Secondary | ICD-10-CM

## 2019-09-28 DIAGNOSIS — Y92009 Unspecified place in unspecified non-institutional (private) residence as the place of occurrence of the external cause: Secondary | ICD-10-CM

## 2019-09-28 DIAGNOSIS — B954 Other streptococcus as the cause of diseases classified elsewhere: Secondary | ICD-10-CM | POA: Diagnosis present

## 2019-09-28 DIAGNOSIS — Z20828 Contact with and (suspected) exposure to other viral communicable diseases: Secondary | ICD-10-CM | POA: Diagnosis not present

## 2019-09-28 DIAGNOSIS — Z79899 Other long term (current) drug therapy: Secondary | ICD-10-CM

## 2019-09-28 DIAGNOSIS — R58 Hemorrhage, not elsewhere classified: Secondary | ICD-10-CM | POA: Diagnosis not present

## 2019-09-28 DIAGNOSIS — E669 Obesity, unspecified: Secondary | ICD-10-CM | POA: Diagnosis present

## 2019-09-28 DIAGNOSIS — R296 Repeated falls: Secondary | ICD-10-CM | POA: Diagnosis not present

## 2019-09-28 DIAGNOSIS — Z1330 Encounter for screening examination for mental health and behavioral disorders, unspecified: Secondary | ICD-10-CM

## 2019-09-28 DIAGNOSIS — N390 Urinary tract infection, site not specified: Secondary | ICD-10-CM | POA: Diagnosis not present

## 2019-09-28 DIAGNOSIS — E119 Type 2 diabetes mellitus without complications: Secondary | ICD-10-CM | POA: Diagnosis present

## 2019-09-28 DIAGNOSIS — Z6828 Body mass index (BMI) 28.0-28.9, adult: Secondary | ICD-10-CM

## 2019-09-28 DIAGNOSIS — L8990 Pressure ulcer of unspecified site, unspecified stage: Secondary | ICD-10-CM | POA: Insufficient documentation

## 2019-09-28 DIAGNOSIS — W010XXA Fall on same level from slipping, tripping and stumbling without subsequent striking against object, initial encounter: Secondary | ICD-10-CM | POA: Diagnosis present

## 2019-09-28 DIAGNOSIS — L89156 Pressure-induced deep tissue damage of sacral region: Secondary | ICD-10-CM | POA: Diagnosis present

## 2019-09-28 DIAGNOSIS — R0902 Hypoxemia: Secondary | ICD-10-CM | POA: Diagnosis not present

## 2019-09-28 DIAGNOSIS — N3000 Acute cystitis without hematuria: Secondary | ICD-10-CM

## 2019-09-28 DIAGNOSIS — R001 Bradycardia, unspecified: Secondary | ICD-10-CM | POA: Diagnosis not present

## 2019-09-28 DIAGNOSIS — Z87891 Personal history of nicotine dependence: Secondary | ICD-10-CM

## 2019-09-28 DIAGNOSIS — Z9181 History of falling: Secondary | ICD-10-CM

## 2019-09-28 DIAGNOSIS — S0990XA Unspecified injury of head, initial encounter: Secondary | ICD-10-CM | POA: Diagnosis not present

## 2019-09-28 DIAGNOSIS — R404 Transient alteration of awareness: Secondary | ICD-10-CM | POA: Diagnosis not present

## 2019-09-28 DIAGNOSIS — R410 Disorientation, unspecified: Secondary | ICD-10-CM | POA: Diagnosis not present

## 2019-09-28 LAB — CBC WITH DIFFERENTIAL/PLATELET
Abs Immature Granulocytes: 0 10*3/uL (ref 0.00–0.07)
Basophils Absolute: 0 10*3/uL (ref 0.0–0.1)
Basophils Relative: 0 %
Eosinophils Absolute: 0.1 10*3/uL (ref 0.0–0.5)
Eosinophils Relative: 1 %
HCT: 44.2 % (ref 39.0–52.0)
Hemoglobin: 14.8 g/dL (ref 13.0–17.0)
Lymphocytes Relative: 8 %
Lymphs Abs: 0.8 10*3/uL (ref 0.7–4.0)
MCH: 31.2 pg (ref 26.0–34.0)
MCHC: 33.5 g/dL (ref 30.0–36.0)
MCV: 93.2 fL (ref 80.0–100.0)
Monocytes Absolute: 0.2 10*3/uL (ref 0.1–1.0)
Monocytes Relative: 2 %
Neutro Abs: 9.3 10*3/uL — ABNORMAL HIGH (ref 1.7–7.7)
Neutrophils Relative %: 89 %
Platelets: 190 10*3/uL (ref 150–400)
RBC: 4.74 MIL/uL (ref 4.22–5.81)
RDW: 13.5 % (ref 11.5–15.5)
WBC: 10.5 10*3/uL (ref 4.0–10.5)
nRBC: 0 % (ref 0.0–0.2)
nRBC: 0 /100 WBC

## 2019-09-28 LAB — BASIC METABOLIC PANEL
Anion gap: 10 (ref 5–15)
BUN: 12 mg/dL (ref 8–23)
CO2: 24 mmol/L (ref 22–32)
Calcium: 9.8 mg/dL (ref 8.9–10.3)
Chloride: 107 mmol/L (ref 98–111)
Creatinine, Ser: 0.86 mg/dL (ref 0.61–1.24)
GFR calc Af Amer: >60 mL/min (ref >60)
GFR calc non Af Amer: >60 mL/min (ref >60)
Glucose, Bld: 110 mg/dL — ABNORMAL HIGH (ref 70–99)
Potassium: 4.3 mmol/L (ref 3.5–5.1)
Sodium: 141 mmol/L (ref 135–145)

## 2019-09-28 LAB — URINALYSIS, ROUTINE W REFLEX MICROSCOPIC
Bilirubin Urine: NEGATIVE
Glucose, UA: NEGATIVE mg/dL
Ketones, ur: 5 mg/dL — AB
Nitrite: POSITIVE — AB
Protein, ur: 30 mg/dL — AB
Specific Gravity, Urine: 1.013 (ref 1.005–1.030)
WBC, UA: >50 WBC/hpf — ABNORMAL HIGH (ref 0–5)
pH: 6 (ref 5.0–8.0)

## 2019-09-28 LAB — SARS CORONAVIRUS 2 (TAT 6-24 HRS): SARS Coronavirus 2: NEGATIVE

## 2019-09-28 LAB — CBG MONITORING, ED: Glucose-Capillary: 93 mg/dL (ref 70–99)

## 2019-09-28 MED ORDER — SODIUM CHLORIDE 0.9 % IV SOLN
1.0000 g | Freq: Once | INTRAVENOUS | Status: AC
  Administered 2019-09-28: 1 g via INTRAVENOUS
  Filled 2019-09-28: qty 10

## 2019-09-28 MED ORDER — SODIUM CHLORIDE 0.9 % IV SOLN
INTRAVENOUS | Status: DC
  Administered 2019-09-28 – 2019-09-29 (×3): via INTRAVENOUS

## 2019-09-28 MED ORDER — SODIUM CHLORIDE 0.9 % IV SOLN
1.0000 g | INTRAVENOUS | Status: DC
  Administered 2019-09-29 – 2019-10-03 (×5): 1 g via INTRAVENOUS
  Filled 2019-09-28 (×3): qty 1
  Filled 2019-09-28 (×3): qty 10
  Filled 2019-09-28: qty 1

## 2019-09-28 MED ORDER — ACETAMINOPHEN 325 MG PO TABS
325.0000 mg | ORAL_TABLET | Freq: Four times a day (QID) | ORAL | Status: DC | PRN
  Administered 2019-10-02 – 2019-10-03 (×2): 650 mg via ORAL
  Filled 2019-09-28 (×2): qty 2

## 2019-09-28 NOTE — ED Notes (Addendum)
Pt attempted to urinate w/ urinal at bedside. Pt was unable to stand independently without assistance. Pt unable to urinate.

## 2019-09-28 NOTE — H&P (Signed)
History and Physical    Keith Pittman [id number] DOB: Apr 11, 1932 DOA: 09/28/2019  PCP: Lavone Orn, MD   Patient coming from: Home    Chief Complaint: Confusion, fall  HPI: Keith Pittman is a 85 y.o. male with medical history significant of subdural hematoma, frequent falls who was brought to the emergency department by his sister today because he was found to be confused more than his baseline and he also had frequent falls at home.  He was admitted and was discharged to skilled nursing facility on March 2020 after he was managed conservatively for  subdural hematoma. Patient was apparently doing fine after he was discharged from rehab to home.  He lives by himself but his sister checks and calls him every day.  Sister was at the bedside and all the information was taken from her.  As per her, patient fell on Monday.  Patient has walker and cane at home but he doesnot not like to use them.  Her sister has put rails around his room to to make easy for ambulation.  She usually calls every day to check on him.  This morning he did not pick his phone.  When she went to check on him, he was more weak than usual and confused.  Patient is alert and awake but disoriented at his baseline.  She then  brought him to the emergency department. There was no report of fever, chills, chest pain, shortness of breath, palpitations, dysuria, nausea, vomiting, diarrhea or headache.  ED Course: Patient seen and examined the bedside in the emergency department.  Currently he is hemodynamically stable.  Looks comfortable, denies any complaints and was not happy that he was brought to the hospital.  Sister was at the bedside.  UA done in the emergency department was remarkable for urinary tract infection with significant leukocytes and positive nitrates.  Patient denied any dysuria.  He was started on ceftriaxone.  Urine culture, blood culture being sent.  CT head also being done to follow-up for his subdural  hematoma.  Review of Systems: As per HPI otherwise 10 point review of systems negative.    Past Medical History:  Diagnosis Date   Cancer [facility])    colon   Diabetes mellitus without complication ([place])     Past Surgical History:  Procedure Laterality Date   COLON SURGERY       reports that he has quit smoking. He has never used smokeless tobacco. He reports current alcohol use. No history on file for drug.  Allergies  Allergen Reactions   Sulfa Antibiotics Other (See Comments)    Sulfa-containing drugs (reaction??- occurred in childhood) [facility] has this listed, but no known reaction     History reviewed. No pertinent family history.   Prior to Admission medications   Medication Sig Start Date End Date Taking? Authorizing Provider  acetaminophen (TYLENOL) 325 MG tablet Take 325-650 mg by mouth every 6 (six) hours as needed for headache.    Yes [provider]  bismuth subsalicylate (PEPTO BISMOL) 262 MG/15ML suspension Take 30 mLs by mouth every 6 (six) hours as needed for indigestion.   Yes [provider]    Physical Exam: Vitals:   09/28/19 1350 09/28/19 1400 09/28/19 1415 09/28/19 1430  BP: (!) 123/58 113/69 114/73 119/64  Pulse: 77 69 66 64  Resp: 17 17 (!) 0 10  Temp: 98.4 F (36.9 C)     TempSrc: Oral     SpO2: 98% 97% 98% 98%  Constitutional: Not in distress, elderly male, deconditioned/debilitated Vitals:   09/28/19 1350 09/28/19 1400 09/28/19 1415 09/28/19 1430  BP: (!) 123/58 113/69 114/73 119/64  Pulse: 77 69 66 64  Resp: 17 17 (!) 0 10  Temp: 98.4 F (36.9 C)     TempSrc: Oral     SpO2: 98% 97% 98% 98%   Eyes: PERRL, lids and conjunctivae normal ENMT: Mucous membranes are moist. Neck:  no masses, no thyromegaly Respiratory: clear to auscultation bilaterally, no wheezing, no crackles. Normal respiratory effort. No accessory muscle use.  Cardiovascular: Regular rate and rhythm, no murmurs / rubs / gallops. No extremity edema.  2+ pedal pulses. No carotid bruits.  Abdomen: no tenderness, no masses palpated. No hepatosplenomegaly. Bowel sounds positive.  Musculoskeletal: no clubbing / cyanosis. No joint deformity upper and lower extremities. Good ROM, no contractures. Normal muscle tone. Erythema on the left knee Skin: no rashes, lesions, ulcers. No induration.Dry scaly skin Neurologic: CN 2-12 grossly intact. Sensation intact Strength 5/5 in all 4.  Alert, awake.  Oriented to place and person only  Foley Catheter:None  Labs on Admission: I have personally reviewed following labs and imaging studies  CBC: Recent Labs  Lab 09/28/19 1440  WBC 10.5  NEUTROABS 9.3*  HGB 14.8  HCT 44.2  MCV 93.2  PLT 99991111   Basic Metabolic Panel: Recent Labs  Lab 09/28/19 1440  NA 141  K 4.3  CL 107  CO2 24  GLUCOSE 110*  BUN 12  CREATININE 0.86  CALCIUM 9.8   GFR: CrCl cannot be calculated (Unknown ideal weight.). Liver Function Tests: No results for input(s): AST, ALT, ALKPHOS, BILITOT, PROT, ALBUMIN in the last 168 hours. No results for input(s): LIPASE, AMYLASE in the last 168 hours. No results for input(s): AMMONIA in the last 168 hours. Coagulation Profile: No results for input(s): INR, PROTIME in the last 168 hours. Cardiac Enzymes: No results for input(s): CKTOTAL, CKMB, CKMBINDEX, TROPONINI in the last 168 hours. BNP (last 3 results) No results for input(s): PROBNP in the last 8760 hours. HbA1C: No results for input(s): HGBA1C in the last 72 hours. CBG: Recent Labs  Lab 09/28/19 1355  GLUCAP 93   Lipid Profile: No results for input(s): CHOL, HDL, LDLCALC, TRIG, CHOLHDL, LDLDIRECT in the last 72 hours. Thyroid Function Tests: No results for input(s): TSH, T4TOTAL, FREET4, T3FREE, THYROIDAB in the last 72 hours. Anemia Panel: No results for input(s): VITAMINB12, FOLATE, FERRITIN, TIBC, IRON, RETICCTPCT in the last 72 hours. Urine analysis:    Component Value Date/Time   COLORURINE YELLOW  09/28/2019 1507   APPEARANCEUR HAZY (A) 09/28/2019 1507   LABSPEC 1.013 09/28/2019 1507   PHURINE 6.0 09/28/2019 1507   GLUCOSEU NEGATIVE 09/28/2019 1507   HGBUR MODERATE (A) 09/28/2019 1507   BILIRUBINUR NEGATIVE 09/28/2019 1507   KETONESUR 5 (A) 09/28/2019 1507   PROTEINUR 30 (A) 09/28/2019 1507   NITRITE POSITIVE (A) 09/28/2019 1507   LEUKOCYTESUR LARGE (A) 09/28/2019 1507    Radiological Exams on Admission: No results found.   Assessment/Plan Active Problems:   UTI (urinary tract infection)   Suspected urinary tract infection: Brought for increased confusion.  UA impressive for UTI.  Urine culture, blood culture being sent.  He is afebrile, no leukocytosis.  Started on ceftriaxone.  Denies dysuria.  Also continue gentle IV fluids.  Frequent fall/gait problem: Has history of frequent falls.  Has walker/cane at home but patient does not use.  Last fall on Monday.  Will request PT/OT evaluation.  May  need rehab on discharge  History of subdural hematoma: Admitted on March 2020 for subdural hematoma.  Conservatively managed.  Follow-up CT scan being done today.  We will follow up the report  History of diabetes type 2: Currently diet controlled.   Severity of Illness: The appropriate patient status for this patient is OBSERVATION.    DVT prophylaxis: SCD Code Status: Full Family Communication: Sister at bedside Consults called: None     Shelly Coss MD [facility] Pager [phone]  If 7PM-7AM, please contact night-coverage [email or web address] Password [email or web address]  09/28/2019, 5:08 PM

## 2019-09-28 NOTE — ED Notes (Signed)
Attempted report 

## 2019-09-28 NOTE — ED Triage Notes (Addendum)
Per [facility], pt from home. Family reports that pt is more confused today than normal. Pt had a fall yesterday evening around 1800 when he "caught my big toe on the door and slipped" It was told that pt did hit his head, no obvious injury. Pt answers all questions correctly except time related questions. Usually oriented to year and sometimes confused on the day, at this time he cannot answer the time at all. VSS. Hx of subdural hematoma in march.

## 2019-09-28 NOTE — ED Provider Notes (Signed)
[facility] EMERGENCY DEPARTMENT Provider Note   CSN: [id number] Arrival date & time: 09/28/19  1339     History   Chief Complaint Chief Complaint  Patient presents with  . Altered Mental Status  . Fall    HPI Keith Pittman is a 85 y.o. male.     HPI   85 year old male coming from home for evaluation of some confusion.  Patient is agrees.  He states that he has no complaints.  "They (family) send me to the hospital over any little thing."  He denies any acute pain.  He did have a fall yesterday when he states that he tripped.  He denies any significant injuries from this fall though.  Additional history obtained from sister at bedside. She lives with patient. She states in the past several days he has been increasingly drowsy. Confused at times. Very off balance when he tries to walk.  Past Medical History:  Diagnosis Date  . Cancer ([place])    colon  . Diabetes mellitus without complication [facility])     Patient Active Problem List   Diagnosis Date Noted  . Unwitnessed fall 02/02/2019  . Rhabdomyolysis 02/02/2019  . Elevated LFTs 02/02/2019  . Alcohol use 02/02/2019  . Subdural hematoma ([place]) 02/01/2019   Past Surgical History:  Procedure Laterality Date  . COLON SURGERY        Home Medications    Prior to Admission medications   Medication Sig Start Date End Date Taking? Authorizing Provider  acetaminophen (TYLENOL) 325 MG tablet Take 650 mg by mouth every 6 (six) hours as needed.    [provider]  bismuth subsalicylate (PEPTO BISMOL) 262 MG/15ML suspension Take 30 mLs by mouth every 6 (six) hours as needed for indigestion.    [provider]    Family History History reviewed. No pertinent family history.  Social History Social History   Tobacco Use  . Smoking status: Former Research scientist (life sciences)  . Smokeless tobacco: Never Used  Substance Use Topics  . Alcohol use: Yes  . Drug use: Not on file     Allergies   Sulfa  antibiotics   Review of Systems Review of Systems  All systems reviewed and negative, other than as noted in HPI.  Physical Exam Updated Vital Signs BP 119/64   Pulse 64   Temp 98.4 F (36.9 C) (Oral)   Resp 10   SpO2 98%   Physical Exam Vitals signs and nursing note reviewed.  Constitutional:      General: He is not in acute distress.    Appearance: He is well-developed.     Comments: Laying in bed.  No acute distress.  HENT:     Head: Normocephalic and atraumatic.  Eyes:     General:        Right eye: No discharge.        Left eye: No discharge.     Conjunctiva/sclera: Conjunctivae normal.  Neck:     Musculoskeletal: Neck supple.  Cardiovascular:     Rate and Rhythm: Normal rate and regular rhythm.     Heart sounds: Normal heart sounds. No murmur. No friction rub. No gallop.   Pulmonary:     Effort: Pulmonary effort is normal. No respiratory distress.     Breath sounds: Normal breath sounds.  Abdominal:     General: There is no distension.     Palpations: Abdomen is soft.     Tenderness: There is no abdominal tenderness.  Musculoskeletal:  General: No tenderness.  Skin:    General: Skin is warm and dry.  Neurological:     Mental Status: He is alert.     Comments: Is clear.  Content appropriate.  Following commands.  Oriented to place and self.  Able to name the president.  Unsure of the exact date.  Cranial nerves II through XII intact.  Strength is 5 out of 5 bilateral upper and lower extremities.  Psychiatric:        Behavior: Behavior normal.        Thought Content: Thought content normal.      ED Treatments / Results  Labs (all labs ordered are listed, but only abnormal results are displayed) Labs Reviewed  URINE CULTURE - Abnormal; Notable for the following components:      Result Value   Culture   (*)    Value: >=100,000 COLONIES/mL GROUP B STREP(S.AGALACTIAE)ISOLATED TESTING AGAINST S. AGALACTIAE NOT ROUTINELY PERFORMED DUE TO  PREDICTABILITY OF AMP/PEN/VAN SUSCEPTIBILITY. Performed at [facility], [place] [address], [place], [place] [address]    All other components within normal limits  CBC WITH DIFFERENTIAL/PLATELET - Abnormal; Notable for the following components:   Neutro Abs 9.3 (*)    All other components within normal limits  BASIC METABOLIC PANEL - Abnormal; Notable for the following components:   Glucose, Bld 110 (*)    All other components within normal limits  URINALYSIS, ROUTINE W REFLEX MICROSCOPIC - Abnormal; Notable for the following components:   APPearance HAZY (*)    Hgb urine dipstick MODERATE (*)    Ketones, ur 5 (*)    Protein, ur 30 (*)    Nitrite POSITIVE (*)    Leukocytes,Ua LARGE (*)    WBC, UA >50 (*)    Bacteria, UA MANY (*)    All other components within normal limits  BASIC METABOLIC PANEL - Abnormal; Notable for the following components:   Potassium 3.4 (*)    CO2 21 (*)    All other components within normal limits  CBC - Abnormal; Notable for the following components:   RBC 4.19 (*)    HCT 38.2 (*)    All other components within normal limits  COMPREHENSIVE METABOLIC PANEL - Abnormal; Notable for the following components:   Glucose, Bld 105 (*)    Total Protein 6.0 (*)    Albumin 2.9 (*)    All other components within normal limits  CBC - Abnormal; Notable for the following components:   RBC 3.99 (*)    Hemoglobin 12.4 (*)    HCT 37.1 (*)    All other components within normal limits  SARS CORONAVIRUS 2 (TAT 6-24 HRS)  CULTURE, BLOOD (ROUTINE X 2)  CULTURE, BLOOD (ROUTINE X 2)  CBG MONITORING, ED    EKG None  Radiology No results found.  Procedures Procedures (including critical care time)  Medications Ordered in ED Medications - No data to display   Initial Impression / Assessment and Plan / ED Course  I have reviewed the triage vital signs and the nursing notes.  Pertinent labs & imaging results that were available during my care of the patient  were reviewed by me and considered in my medical decision making (see chart for details).       85 year old male with increased drowsiness and confusion. Likely secondary to UTI. Rocephin ordered. Patient extremely off balance when to standing at bedside. Given his fall yesterday, I feel that it be most appropriate admit him for IV  antibiotics at this time until he shows some clinical improvement.  Final Clinical Impressions(s) / ED Diagnoses   Final diagnoses:  Lower urinary tract infectious disease  Altered mental status, unspecified altered mental status type    ED Discharge Orders    None       Virgel Manifold, MD 09/30/19 [phone]

## 2019-09-28 NOTE — ED Notes (Signed)
Patient back from CT.

## 2019-09-28 NOTE — ED Notes (Signed)
Patient transported to CT 

## 2019-09-29 ENCOUNTER — Encounter (HOSPITAL_COMMUNITY): Payer: Self-pay | Admitting: General Practice

## 2019-09-29 ENCOUNTER — Other Ambulatory Visit: Payer: Self-pay

## 2019-09-29 DIAGNOSIS — E119 Type 2 diabetes mellitus without complications: Secondary | ICD-10-CM | POA: Diagnosis present

## 2019-09-29 DIAGNOSIS — M2550 Pain in unspecified joint: Secondary | ICD-10-CM | POA: Diagnosis not present

## 2019-09-29 DIAGNOSIS — R2689 Other abnormalities of gait and mobility: Secondary | ICD-10-CM | POA: Diagnosis not present

## 2019-09-29 DIAGNOSIS — M6281 Muscle weakness (generalized): Secondary | ICD-10-CM | POA: Diagnosis not present

## 2019-09-29 DIAGNOSIS — Y92009 Unspecified place in unspecified non-institutional (private) residence as the place of occurrence of the external cause: Secondary | ICD-10-CM | POA: Diagnosis not present

## 2019-09-29 DIAGNOSIS — L89156 Pressure-induced deep tissue damage of sacral region: Secondary | ICD-10-CM | POA: Diagnosis present

## 2019-09-29 DIAGNOSIS — N3000 Acute cystitis without hematuria: Secondary | ICD-10-CM | POA: Diagnosis not present

## 2019-09-29 DIAGNOSIS — R4182 Altered mental status, unspecified: Secondary | ICD-10-CM | POA: Diagnosis not present

## 2019-09-29 DIAGNOSIS — B954 Other streptococcus as the cause of diseases classified elsewhere: Secondary | ICD-10-CM | POA: Diagnosis present

## 2019-09-29 DIAGNOSIS — R296 Repeated falls: Secondary | ICD-10-CM | POA: Diagnosis present

## 2019-09-29 DIAGNOSIS — Z882 Allergy status to sulfonamides status: Secondary | ICD-10-CM | POA: Diagnosis not present

## 2019-09-29 DIAGNOSIS — Z6828 Body mass index (BMI) 28.0-28.9, adult: Secondary | ICD-10-CM | POA: Diagnosis not present

## 2019-09-29 DIAGNOSIS — L8990 Pressure ulcer of unspecified site, unspecified stage: Secondary | ICD-10-CM | POA: Insufficient documentation

## 2019-09-29 DIAGNOSIS — Z9181 History of falling: Secondary | ICD-10-CM | POA: Diagnosis not present

## 2019-09-29 DIAGNOSIS — Z0489 Encounter for examination and observation for other specified reasons: Secondary | ICD-10-CM | POA: Diagnosis not present

## 2019-09-29 DIAGNOSIS — R2681 Unsteadiness on feet: Secondary | ICD-10-CM | POA: Diagnosis not present

## 2019-09-29 DIAGNOSIS — L89001 Pressure ulcer of unspecified elbow, stage 1: Secondary | ICD-10-CM | POA: Diagnosis not present

## 2019-09-29 DIAGNOSIS — W010XXA Fall on same level from slipping, tripping and stumbling without subsequent striking against object, initial encounter: Secondary | ICD-10-CM | POA: Diagnosis present

## 2019-09-29 DIAGNOSIS — Z87891 Personal history of nicotine dependence: Secondary | ICD-10-CM | POA: Diagnosis not present

## 2019-09-29 DIAGNOSIS — B951 Streptococcus, group B, as the cause of diseases classified elsewhere: Secondary | ICD-10-CM | POA: Diagnosis not present

## 2019-09-29 DIAGNOSIS — E669 Obesity, unspecified: Secondary | ICD-10-CM | POA: Diagnosis present

## 2019-09-29 DIAGNOSIS — R41841 Cognitive communication deficit: Secondary | ICD-10-CM | POA: Diagnosis not present

## 2019-09-29 DIAGNOSIS — R52 Pain, unspecified: Secondary | ICD-10-CM | POA: Diagnosis not present

## 2019-09-29 DIAGNOSIS — N390 Urinary tract infection, site not specified: Secondary | ICD-10-CM | POA: Diagnosis present

## 2019-09-29 DIAGNOSIS — Z85038 Personal history of other malignant neoplasm of large intestine: Secondary | ICD-10-CM | POA: Diagnosis not present

## 2019-09-29 DIAGNOSIS — Z1330 Encounter for screening examination for mental health and behavioral disorders, unspecified: Secondary | ICD-10-CM | POA: Diagnosis not present

## 2019-09-29 DIAGNOSIS — Z87820 Personal history of traumatic brain injury: Secondary | ICD-10-CM | POA: Diagnosis not present

## 2019-09-29 DIAGNOSIS — Z7401 Bed confinement status: Secondary | ICD-10-CM | POA: Diagnosis not present

## 2019-09-29 DIAGNOSIS — R5381 Other malaise: Secondary | ICD-10-CM | POA: Diagnosis not present

## 2019-09-29 DIAGNOSIS — Z79899 Other long term (current) drug therapy: Secondary | ICD-10-CM | POA: Diagnosis not present

## 2019-09-29 DIAGNOSIS — Z20828 Contact with and (suspected) exposure to other viral communicable diseases: Secondary | ICD-10-CM | POA: Diagnosis present

## 2019-09-29 LAB — BASIC METABOLIC PANEL
Anion gap: 9 (ref 5–15)
BUN: 13 mg/dL (ref 8–23)
CO2: 21 mmol/L — ABNORMAL LOW (ref 22–32)
Calcium: 9.3 mg/dL (ref 8.9–10.3)
Chloride: 109 mmol/L (ref 98–111)
Creatinine, Ser: 0.79 mg/dL (ref 0.61–1.24)
GFR calc Af Amer: >60 mL/min (ref >60)
GFR calc non Af Amer: >60 mL/min (ref >60)
Glucose, Bld: 99 mg/dL (ref 70–99)
Potassium: 3.4 mmol/L — ABNORMAL LOW (ref 3.5–5.1)
Sodium: 139 mmol/L (ref 135–145)

## 2019-09-29 LAB — URINE CULTURE: Culture: >=100000 — AB

## 2019-09-29 LAB — CBC
HCT: 38.2 % — ABNORMAL LOW (ref 39.0–52.0)
Hemoglobin: 13.1 g/dL (ref 13.0–17.0)
MCH: 31.3 pg (ref 26.0–34.0)
MCHC: 34.3 g/dL (ref 30.0–36.0)
MCV: 91.2 fL (ref 80.0–100.0)
Platelets: 187 10*3/uL (ref 150–400)
RBC: 4.19 MIL/uL — ABNORMAL LOW (ref 4.22–5.81)
RDW: 13.6 % (ref 11.5–15.5)
WBC: 7.7 10*3/uL (ref 4.0–10.5)
nRBC: 0 % (ref 0.0–0.2)

## 2019-09-29 MED ORDER — DIVALPROEX SODIUM 250 MG PO DR TAB
250.0000 mg | DELAYED_RELEASE_TABLET | Freq: Every day | ORAL | Status: DC
  Administered 2019-09-29 – 2019-10-03 (×5): 250 mg via ORAL
  Filled 2019-09-29 (×6): qty 1

## 2019-09-29 MED ORDER — OLANZAPINE 5 MG PO TBDP
5.0000 mg | ORAL_TABLET | Freq: Once | ORAL | Status: AC
  Administered 2019-09-29: 20:00:00 5 mg via ORAL
  Filled 2019-09-29: qty 1

## 2019-09-29 MED ORDER — POTASSIUM CHLORIDE CRYS ER 20 MEQ PO TBCR
40.0000 meq | EXTENDED_RELEASE_TABLET | Freq: Once | ORAL | Status: AC
  Administered 2019-09-29: 40 meq via ORAL
  Filled 2019-09-29: qty 2

## 2019-09-29 NOTE — [place] FL2 (Signed)
  [place] MEDICAID FL2 LEVEL OF CARE SCREENING TOOL     IDENTIFICATION  Patient Name: Keith Pittman Birthdate: 1932-08-24 Sex: male Admission Date (Current Location): 09/28/2019  [facility] and [place] Number:  Herbalist and Address:  [facility], [place] [address], [place], [place] [address]      Provider Number: [id number]  Attending Physician Name and Address:  Geradine Girt, DO  Relative Name and Phone Number:       Current Level of Care: Hospital Recommended Level of Care: [place] Prior Approval Number:    Date Approved/Denied:   PASRR Number: [id number]  Discharge Plan: SNF    Current Diagnoses: Patient Active Problem List   Diagnosis Date Noted  . Pressure injury of skin 09/29/2019  . UTI (urinary tract infection) 09/28/2019  . Unwitnessed fall 02/02/2019  . Rhabdomyolysis 02/02/2019  . Elevated LFTs 02/02/2019  . Alcohol use 02/02/2019  . Subdural hematoma ([place]) 02/01/2019    Orientation RESPIRATION BLADDER Height & Weight     Self, Place, Situation Normal Incontinent, External catheter Weight:   Height:     BEHAVIORAL SYMPTOMS/MOOD NEUROLOGICAL BOWEL NUTRITION STATUS      Continent Diet(see discharge summary)  AMBULATORY STATUS COMMUNICATION OF NEEDS Skin   Extensive Assist Verbally Bruising, Other (Comment)(deep tissue injury with foam to sacrum)                       Personal Care Assistance Level of Assistance  Dressing, Bathing, Feeding Bathing Assistance: Maximum assistance Feeding assistance: Independent Dressing Assistance: Maximum assistance     Functional Limitations Info  Sight, Hearing, Speech Sight Info: Adequate Hearing Info: Adequate Speech Info: Adequate    SPECIAL CARE FACTORS FREQUENCY  PT (By licensed PT), OT (By licensed OT)     PT Frequency: 5x week OT Frequency: 5x week            Contractures Contractures Info: Not present    Additional Factors Info   Code Status, Allergies, Psychotropic Code Status Info: Full Code Allergies Info: Sulfa Antibiotics Psychotropic Info: OLANZapine zydis (ZYPREXA) disintegrating tablet 5 mg         Current Medications (09/29/2019):  This is the current hospital active medication list Current Facility-Administered Medications  Medication Dose Route Frequency Provider Last Rate Last Dose  . 0.9 %  sodium chloride infusion   Intravenous Continuous Eulogio Bear U, DO 50 mL/hr at 09/29/19 1552    . acetaminophen (TYLENOL) tablet 325-650 mg  325-650 mg Oral Q6H PRN Shelly Coss, MD      . cefTRIAXone (ROCEPHIN) 1 g in sodium chloride 0.9 % 100 mL IVPB  1 g Intravenous Q24H Adhikari, Amrit, MD      . divalproex (DEPAKOTE) DR tablet 250 mg  250 mg Oral QHS Vann, Jessica U, DO      . OLANZapine zydis (ZYPREXA) disintegrating tablet 5 mg  5 mg Oral Once Geradine Girt, DO         Discharge Medications: Please see discharge summary for a list of discharge medications.  Relevant Imaging Results:  Relevant Lab Results:   Additional Information SS#: [place], [place]

## 2019-09-29 NOTE — Evaluation (Signed)
Occupational Therapy Evaluation Patient Details Name: Keith Pittman MRN: [id number] DOB: [birth year] Today's Date: 09/29/2019    History of Present Illness Pt is an 85 yo male presenting following a fall at home where the pt was discovered by his sister, and brought to the hospital where he was found to have AMS and UTI. Per pt sister, he is disoriented at baseline, with PMH of frequent falls, SDH min march 2020, DM II, and colon cancer.   Clinical Impression   Pt with decline in function and safety with ADLs and ADL mobility with impaired strength, balance ane endurance. Pt with hx pf cognitive impairments and required multimodal cues and repeat instructions throughout activity. Pt reports that PTA, he lived at home alone and his sisters live nearby; he states that he was independent with ADLs/selfcare and mobility with no ADs at home although chart states that he has a RW and cane at home but doesn't want to use them. Pt currently requires min A with LB ADLs and min guard A with toileting and with mobility using RW.  He has increased difficulty maintaining his balance, especially in dynamic or complex tasks, and has poor reactive balance, which make him a higher fall risk. The patient's course is further complicated by limitations in cognition, specifically problem-solving and STM, as both appeared limited during the session. The pt needs multiple reminders for safety cues during mobility. The pt is not currently safe to return living at home alone. Pt would benefit from acute OT services to address impairments to maximize level of function and safety.     Follow Up Recommendations  SNF;Supervision/Assistance - 24 hour(pt is not safe to be home alone and may need ALF at some point)    Equipment Recommendations  None recommended by OT    Recommendations for Other Services       Precautions / Restrictions Precautions Precautions: Fall Precaution Comments: Per pt sister, he falls frequently  at home (pt denies this). Pt STM is limited, making him a higher fall risk Restrictions Weight Bearing Restrictions: No      Mobility Bed Mobility Overal bed mobility: Needs Assistance Bed Mobility: Supine to Sit     Supine to sit: HOB elevated;Min assist Sit to supine: HOB elevated   General bed mobility comments: VCs for sequencing, pt able to complete with min A for pt to fully rotate trunk to sitting EOB  Transfers Overall transfer level: Needs assistance Equipment used: Rolling walker (2 wheeled) Transfers: Sit to/from Stand Sit to Stand: Min guard              Balance Overall balance assessment: Needs assistance;History of Falls Sitting-balance support: No upper extremity supported;Feet supported Sitting balance-Leahy Scale: Good     Standing balance support: Bilateral upper extremity supported;During functional activity Standing balance-Leahy Scale: Fair Standing balance comment: Pt can stand without AD, but demos poor balance and reactions                           ADL either performed or assessed with clinical judgement   ADL Overall ADL's : Needs assistance/impaired Eating/Feeding: Independent;Sitting   Grooming: Wash/dry hands;Wash/dry face;Min guard;Standing   Upper Body Bathing: Set up;Sitting   Lower Body Bathing: Minimal assistance;Sit to/from stand   Upper Body Dressing : Set up;Sitting   Lower Body Dressing: Minimal assistance;Sit to/from stand   Toilet Transfer: Min guard;Ambulation;RW;Regular Toilet;Grab bars;Cueing for safety   Toileting- Clothing Manipulation and Hygiene: Min guard;Sit  to/from stand   Tub/ Banker: Min guard;Ambulation;Rolling walker;3 in 1;Cueing for safety   Functional mobility during ADLs: Min guard;Cueing for safety;Rolling walker       Vision Patient Visual Report: No change from baseline       Perception     Praxis      Pertinent Vitals/Pain Pain Assessment: No/denies pain      Hand Dominance Right   Extremity/Trunk Assessment Upper Extremity Assessment Upper Extremity Assessment: Overall WFL for tasks assessed   Lower Extremity Assessment Lower Extremity Assessment: Defer to PT evaluation   Cervical / Trunk Assessment Cervical / Trunk Assessment: Normal   Communication Communication Communication: No difficulties   Cognition Arousal/Alertness: Awake/alert Behavior During Therapy: Agitated Overall Cognitive Status: Impaired/Different from baseline Area of Impairment: Attention;Memory;Safety/judgement;Problem solving                   Current Attention Level: Sustained Memory: Decreased short-term memory   Safety/Judgement: Decreased awareness of safety   Problem Solving: Slow processing;Decreased initiation;Difficulty sequencing;Requires verbal cues General Comments: Pt is generally very pleasant, but requires slow, repeated, simple commands and requires frequent reminders of instructions. For example, pt was instructed to sit up in the recliner for about 1 hour and then to call RN using call bell for assist back to bed. The pt asked for this information to be repeated 5-6 times before PT/OT left the room.   General Comments       Exercises     Shoulder Instructions      Home Living Family/patient expects to be discharged to:: Private residence Living Arrangements: Alone Available Help at Discharge: Family;Available PRN/intermittently Type of Home: House Home Access: Level entry     Home Layout: One level     Bathroom Shower/Tub: Occupational psychologist: Standard     Home Equipment: Grab bars - toilet;Grab bars - tub/shower   Additional Comments: Pt reports no AD, but in other notes, pt reportedly has RW and cane, this makes me question if pt is most reliable historian      Prior Functioning/Environment Level of Independence: Independent  Gait / Transfers Assistance Needed: pt states that he doesn't have,  need or use an AD for mobility (notes state that pt has cane and RW but does not like ot use them) ADL's / Homemaking Assistance Needed: pt reports that he was independent with ADls/selfcare and sisters prep his meals   Comments: independent ADLs, IADLs, not driving--reports family checks in on him daily         OT Problem List: Decreased strength;Impaired balance (sitting and/or standing);Decreased cognition;Decreased knowledge of precautions;Decreased activity tolerance;Decreased safety awareness;Decreased knowledge of use of DME or AE      OT Treatment/Interventions: Self-care/ADL training;DME and/or AE instruction;Therapeutic activities;Patient/family education    OT Goals(Current goals can be found in the care plan section) Acute Rehab OT Goals Patient Stated Goal: return home as soon as possible OT Goal Formulation: With patient Time For Goal Achievement: 10/13/19 Potential to Achieve Goals: Good ADL Goals Pt Will Perform Grooming: with supervision;with set-up;standing Pt Will Perform Lower Body Bathing: with min guard assist;sit to/from stand Pt Will Perform Lower Body Dressing: with min guard assist;sit to/from stand Pt Will Transfer to Toilet: with supervision;ambulating;grab bars Pt Will Perform Toileting - Clothing Manipulation and hygiene: with supervision;sit to/from stand Pt Will Perform Tub/Shower Transfer: with supervision;ambulating;rolling walker;shower seat;3 in 1;grab bars  OT Frequency: Min 2X/week   Barriers to D/C: Decreased caregiver support  Co-evaluation PT/OT/SLP Co-Evaluation/Treatment: Yes Reason for Co-Treatment: Necessary to address cognition/behavior during functional activity;For patient/therapist safety;To address functional/ADL transfers PT goals addressed during session: Mobility/safety with mobility;Balance OT goals addressed during session: ADL's and self-care;Proper use of Adaptive equipment and DME      AM-PAC OT "6 Clicks" Daily  Activity     Outcome Measure Help from another person eating meals?: None Help from another person taking care of personal grooming?: A Little Help from another person toileting, which includes using toliet, bedpan, or urinal?: A Little Help from another person bathing (including washing, rinsing, drying)?: A Little Help from another person to put on and taking off regular upper body clothing?: A Little Help from another person to put on and taking off regular lower body clothing?: A Little 6 Click Score: 19   End of Session Equipment Utilized During Treatment: Gait belt;Rolling walker;Other (comment)(3 in 1) Nurse Communication: Mobility status  Activity Tolerance: Patient tolerated treatment well Patient left: in chair;with call bell/phone within reach;with chair alarm set  OT Visit Diagnosis: Unsteadiness on feet (R26.81);Other abnormalities of gait and mobility (R26.89);History of falling (Z91.81);Repeated falls (R29.6);Other symptoms and signs involving cognitive function                Time: 1247-1313 OT Time Calculation (min): 26 min Charges:  OT General Charges $OT Visit: 1 Visit OT Evaluation $OT Eval Moderate Complexity: 1 Mod    Britt Bottom 09/29/2019, 2:21 PM

## 2019-09-29 NOTE — Evaluation (Addendum)
Physical Therapy Evaluation Patient Details Name: Keith Pittman MRN: [id number] DOB: 1932-10-21 Today's Date: 09/29/2019   History of Present Illness  Pt is an 85 yo male presenting following a fall at home where the pt was discovered by his sister, and brought to the hospital where he was found to have AMS and UTI. Per pt sister, he is disoriented at baseline, with PMH of frequent falls, SDH min march 2020, DM II, and colon cancer.  Clinical Impression  Pt in bed upon PT arrival, willing to participate in PT session. Pt was able to demonstrate good bed mobility, needing only minA with an elevated HOB. Pt was also able to demo good strength in transfers, requiring only min guard with use of RW. The pt's deficits become clear in ambulation and static balance tasks as he has increased difficulty maintaining his balance, especially in dynamic or complex tasks, and has poor reactive balance, which make him a higher fall risk. The patient's course is further complicated by limitations in cognition, specifically problem-solving and STM, as both appeared limited during the session. The pt needs multiple reminders for safety cues during mobility. The pt is not currently safe to return living at home, and so I recommend SNF to maximize rehab and balance.     Follow Up Recommendations SNF; Supervision/Assistance - 24 hour (SNF vs. ALF (recommend SNF d/c to maximize therapy)    Equipment Recommendations  Rolling walker with 5" wheels    Recommendations for Other Services       Precautions / Restrictions Precautions Precautions: Fall Precaution Comments: Per pt sister, he falls frequently at home (pt denies this). Pt STM is limited, making him a more sig fall risk Restrictions Weight Bearing Restrictions: No      Mobility  Bed Mobility Overal bed mobility: Needs Assistance Bed Mobility: Supine to Sit     Supine to sit: HOB elevated;Min assist Sit to supine: HOB elevated   General bed  mobility comments: VCs for sequencing, pt able to complete with minA for pt to fully rotate trunk to sitting EOB  Transfers Overall transfer level: Needs assistance Equipment used: Rolling walker (2 wheeled) Transfers: Sit to/from Stand Sit to Stand: Min guard            Ambulation/Gait Ambulation/Gait assistance: Min guard;+2 safety/equipment Gait Distance (Feet): 50 Feet Assistive device: Rolling walker (2 wheeled) Gait Pattern/deviations: Step-through pattern;Decreased dorsiflexion - right;Decreased dorsiflexion - left Gait velocity: 0.60m/s Gait velocity interpretation: <1.8 ft/sec, indicate of risk for recurrent falls General Gait Details: Pt able to ambulate with out assist, but requires support for guarding and supervision as he easily loses his balance, especially when asked to look at surroundings while walking. The Pt demos poor control of RW, especially in tight spaces, and benefits from minA to manage balance and RW management.  Stairs            Wheelchair Mobility    Modified Rankin (Stroke Patients Only)       Balance Overall balance assessment: Needs assistance;History of Falls Sitting-balance support: No upper extremity supported;Feet supported Sitting balance-Leahy Scale: Good     Standing balance support: Bilateral upper extremity supported;During functional activity Standing balance-Leahy Scale: Fair Standing balance comment: Pt can stand without AD, but demos poor balance and reactions                             Pertinent Vitals/Pain Pain Assessment: No/denies pain    Home  Living Family/patient expects to be discharged to:: Private residence Living Arrangements: Alone Available Help at Discharge: Family(pt reports he has 6 siblings that all live in [place] and can come as needed) Type of Home: House Home Access: Level entry     Home Layout: One level [place]: Grab bars - toilet;Grab bars - tub/shower Additional  Comments: Pt reports no AD, but in other notes, pt reportedly has RW and cane, this makes me question if pt is most reliable historian    Prior Function Level of Independence: Independent         Comments: independent ADLs, IADLs, not driving--reports family checks in on him daily      Hand Dominance   Dominant Hand: Right    Extremity/Trunk Assessment   Upper Extremity Assessment Upper Extremity Assessment: Overall WFL for tasks assessed;Defer to OT evaluation    Lower Extremity Assessment Lower Extremity Assessment: Overall WFL for tasks assessed    Cervical / Trunk Assessment Cervical / Trunk Assessment: Normal  Communication   Communication: No difficulties  Cognition Arousal/Alertness: Awake/alert Behavior During Therapy: Agitated(Although pt could be convinced to participate and was mostly very cooperative, pt became agitated when PT/OT were "talking too fast" or "moving too fast" for him) Overall Cognitive Status: Impaired/Different from baseline Area of Impairment: Attention;Memory;Safety/judgement;Problem solving                   Current Attention Level: Sustained Memory: Decreased short-term memory   Safety/Judgement: Decreased awareness of safety   Problem Solving: Slow processing;Decreased initiation;Difficulty sequencing;Requires verbal cues General Comments: Pt is generally very pleasant, but requires slow, repeated, simple commands and requires frequent reminders of instructions. For example, pt was instructed to sit up in the recliner for about 1 hour and then to call RN using call bell for assist back to bed. The pt asked for this information to be repeated 5-6 times before PT/OT left the room.      General Comments      Exercises     Assessment/Plan    PT Assessment Patient needs continued PT services  PT Problem List Decreased strength;Decreased mobility;Decreased safety awareness;Decreased coordination;Decreased cognition;Decreased  balance       PT Treatment Interventions Therapeutic exercise;Gait training;Balance training;Stair training;Functional mobility training;Therapeutic activities;Patient/family education    PT Goals (Current goals can be found in the Care Plan section)  Acute Rehab PT Goals Patient Stated Goal: return home as soon as possible PT Goal Formulation: With patient Time For Goal Achievement: 10/13/19 Potential to Achieve Goals: Fair    Frequency Min 2X/week   Barriers to discharge Decreased caregiver support Pt living alone prior to admission, he is not currently safe to go home living alone even with intermittent checks from family    Co-evaluation PT/OT/SLP Co-Evaluation/Treatment: Yes Reason for Co-Treatment: Necessary to address cognition/behavior during functional activity;Complexity of the patient's impairments (multi-system involvement) PT goals addressed during session: Mobility/safety with mobility;Balance         AM-PAC PT "6 Clicks" Mobility  Outcome Measure Help needed turning from your back to your side while in a flat bed without using bedrails?: None Help needed moving from lying on your back to sitting on the side of a flat bed without using bedrails?: A Little Help needed moving to and from a bed to a chair (including a wheelchair)?: A Little Help needed standing up from a chair using your arms (e.g., wheelchair or bedside chair)?: A Little Help needed to walk in hospital room?: A Little Help needed  climbing 3-5 steps with a railing? : A Lot 6 Click Score: 18    End of Session Equipment Utilized During Treatment: Gait belt Activity Tolerance: Patient tolerated treatment well Patient left: in chair;with chair alarm set;with call bell/phone within reach Nurse Communication: Mobility status;Precautions;Other (comment)(cognition and limited STM as it relates to safety OOB) PT Visit Diagnosis: Unsteadiness on feet (R26.81);Other abnormalities of gait and mobility  (R26.89);Repeated falls (R29.6);History of falling (Z91.81)    Time: [id number] PT Time Calculation (min) (ACUTE ONLY): 36 min   Charges:   PT Evaluation $PT Eval Moderate Complexity: 1 Mod PT Treatments $Gait Training: 8-22 mins        Mickey Farber, PT, DPT   Acute Rehabilitation Department [phone]  Otho Bellows 09/29/2019, 1:55 PM  Addendum to clarify d/c plan/recommendations.   Mickey Farber, PT, DPT   Acute Rehabilitation Department [phone]

## 2019-09-29 NOTE — Progress Notes (Addendum)
Progress Note    Keith Pittman  [id number] DOB: 1932/04/23  DOA: 09/28/2019 PCP: Lavone Orn, MD    Brief Narrative:     Medical records reviewed and are as summarized below:  Keith Pittman is an 85 y.o. male with medical history significant of subdural hematoma, frequent falls who was brought to the emergency department by his sister today because he was found to be confused more than his baseline and he also had frequent falls at home.  He was admitted and was discharged to skilled nursing facility on March 2020 after he was managed conservatively for  subdural hematoma. Patient was apparently doing fine after he was discharged from rehab to home.  He lives by himself but his sister checks and calls him every day.  Sister was at the bedside and all the information was taken from her.  As per her, patient fell on Monday.  Patient has walker and cane at home but he doesnot not like to use them.  Her sister has put rails around his room to to make easy for ambulation.  She usually calls every day to check on him.  This morning he did not pick his phone.  When she went to check on him, he was more weak than usual and confused.  Assessment/Plan:   Active Problems:   UTI (urinary tract infection)   Pressure injury of skin   Suspected urinary tract infection: Brought for increased confusion.  UA impressive for UTI.  Urine culture, blood culture being sent.   - Started on ceftriaxone -bladder scan PRN  Frequent fall/gait problem: Has history of frequent falls.  Has walker/cane at home but patient does not use.  Last fall on Monday.  Will request PT/OT evaluation- SNF recommended  History of subdural hematoma: Admitted on March 2020 for subdural hematoma.  Conservatively managed.  CT scan shows improvement  Sacrum deep tissue injury -POA    Family Communication/Anticipated D/C date and plan/Code Status   DVT prophylaxis: scd Code Status: Full Code.  Family  Communication: spoke with sister Disposition Plan: no safe d/c plan for patient yet.  Needs continued treatment of UTI to determine if he will be safe to return home   Medical Consultants:    None.     Subjective:   "look lady- I'm not answering any of your questions"  Objective:    Vitals:   09/28/19 2354 09/29/19 0020 09/29/19 0335 09/29/19 1404  BP:  (!) 119/56 121/64 124/71  Pulse:  67 62 78  Resp:  18 17 14   Temp: 98.1 F (36.7 C) 98.5 F (36.9 C) 98.2 F (36.8 C) 98.4 F (36.9 C)  TempSrc: Oral  Oral Oral  SpO2:  96% 97% 100%    Intake/Output Summary (Last 24 hours) at 09/29/2019 1457 Last data filed at 09/29/2019 1407 Gross per 24 hour  Intake 1367.21 ml  Output 650 ml  Net 717.21 ml   There were no vitals filed for this visit.  Exam: Refused to answer orientation questions rrr No increased work of breathing    Data Reviewed:   I have personally reviewed following labs and imaging studies:  Labs: Labs show the following:   Basic Metabolic Panel: Recent Labs  Lab 09/28/19 1440 09/29/19 0121  NA 141 139  K 4.3 3.4*  CL 107 109  CO2 24 21*  GLUCOSE 110* 99  BUN 12 13  CREATININE 0.86 0.79  CALCIUM 9.8 9.3   GFR CrCl cannot be calculated (  Unknown ideal weight.). Liver Function Tests: No results for input(s): AST, ALT, ALKPHOS, BILITOT, PROT, ALBUMIN in the last 168 hours. No results for input(s): LIPASE, AMYLASE in the last 168 hours. No results for input(s): AMMONIA in the last 168 hours. Coagulation profile No results for input(s): INR, PROTIME in the last 168 hours.  CBC: Recent Labs  Lab 09/28/19 1440 09/29/19 0121  WBC 10.5 7.7  NEUTROABS 9.3*  --   HGB 14.8 13.1  HCT 44.2 38.2*  MCV 93.2 91.2  PLT 190 187   Cardiac Enzymes: No results for input(s): CKTOTAL, CKMB, CKMBINDEX, TROPONINI in the last 168 hours. BNP (last 3 results) No results for input(s): PROBNP in the last 8760 hours. CBG: Recent Labs  Lab 09/28/19  1355  GLUCAP 93   D-Dimer: No results for input(s): DDIMER in the last 72 hours. Hgb A1c: No results for input(s): HGBA1C in the last 72 hours. Lipid Profile: No results for input(s): CHOL, HDL, LDLCALC, TRIG, CHOLHDL, LDLDIRECT in the last 72 hours. Thyroid function studies: No results for input(s): TSH, T4TOTAL, T3FREE, THYROIDAB in the last 72 hours.  Invalid input(s): FREET3 Anemia work up: No results for input(s): VITAMINB12, FOLATE, FERRITIN, TIBC, IRON, RETICCTPCT in the last 72 hours. Sepsis Labs: Recent Labs  Lab 09/28/19 1440 09/29/19 0121  WBC 10.5 7.7    Microbiology Recent Results (from the past 240 hour(s))  Urine culture     Status: Abnormal   Collection Time: 09/28/19  4:09 PM   Specimen: Urine, Catheterized  Result Value Ref Range Status   Specimen Description URINE, CATHETERIZED  Final   Special Requests NONE  Final   Culture (A)  Final    >=100,000 COLONIES/mL GROUP B STREP(S.AGALACTIAE)ISOLATED TESTING AGAINST S. AGALACTIAE NOT ROUTINELY PERFORMED DUE TO PREDICTABILITY OF AMP/PEN/VAN SUSCEPTIBILITY. Performed at [facility], [place] [address], [place], [place] [address]    Report Status 09/29/2019 FINAL  Final  SARS CORONAVIRUS 2 (TAT 6-24 HRS) Nasopharyngeal Nasopharyngeal Swab     Status: None   Collection Time: 09/28/19  4:41 PM   Specimen: Nasopharyngeal Swab  Result Value Ref Range Status   SARS Coronavirus 2 NEGATIVE NEGATIVE Final    Comment: (NOTE) SARS-CoV-2 target nucleic acids are NOT DETECTED. The SARS-CoV-2 RNA is generally detectable in upper and lower respiratory specimens during the acute phase of infection. Negative results do not preclude SARS-CoV-2 infection, do not rule out co-infections with other pathogens, and should not be used as the sole basis for treatment or other patient management decisions. Negative results must be combined with clinical observations, patient history, and epidemiological information. The  expected result is Negative. Fact Sheet for Patients: SugarRoll.be Fact Sheet for Healthcare Providers: https://www.woods-mathews.com/ This test is not yet approved or cleared by the [place] FDA and  has been authorized for detection and/or diagnosis of SARS-CoV-2 by FDA under an Emergency Use Authorization (EUA). This EUA will remain  in effect (meaning this test can be used) for the duration of the COVID-19 declaration under Section 56 4(b)(1) of the Act, 21 U.S.C. section 360bbb-3(b)(1), unless the authorization is terminated or revoked sooner. Performed at [facility], [place] [address] [place], [place] [address]   Culture, blood (routine x 2)     Status: None (Preliminary result)   Collection Time: 09/28/19  9:17 PM   Specimen: BLOOD  Result Value Ref Range Status   Specimen Description BLOOD LEFT ANTECUBITAL  Final   Special Requests   Final  BOTTLES DRAWN AEROBIC AND ANAEROBIC Blood Culture results may not be optimal due to an inadequate volume of blood received in culture bottles   Culture   Final    NO GROWTH < 24 HOURS Performed at [place] [address] [place], [place] [address]    Report Status PENDING  Incomplete  Culture, blood (routine x 2)     Status: None (Preliminary result)   Collection Time: 09/28/19  9:19 PM   Specimen: BLOOD LEFT HAND  Result Value Ref Range Status   Specimen Description BLOOD LEFT HAND  Final   Special Requests   Final    BOTTLES DRAWN AEROBIC ONLY Blood Culture results may not be optimal due to an inadequate volume of blood received in culture bottles   Culture   Final    NO GROWTH < 24 HOURS Performed at [facility], [place] [address] [place], [place] [address]    Report Status PENDING  Incomplete    Procedures and diagnostic studies:  Ct Head Wo Contrast  Result Date: 09/28/2019 CLINICAL DATA:  Confusion. Fall yesterday. History of subdural hematoma. EXAM:  CT HEAD WITHOUT CONTRAST TECHNIQUE: Contiguous axial images were obtained from the base of the skull through the vertex without intravenous contrast. COMPARISON:  03/15/2019 FINDINGS: Brain: The subdural hematoma over the right cerebral convexity on the prior CT has greatly decreased in size with a residual hypoattenuating collection measuring up to 4 mm in thickness. Mass effect on the right cerebrum has resolved, and there is no residual midline shift. A hypoattenuating subdural hematoma over the left cerebral convexity measures up to 5 mm in thickness and is unchanged. No acute intracranial hemorrhage, acute infarct, or mass is identified. There is mild cerebral atrophy. Patchy and confluent hypodensities in the cerebral white matter bilaterally are unchanged and nonspecific but compatible with severe chronic small vessel ischemic disease. Left pontine calcification is unchanged. Vascular: Calcified atherosclerosis at the skull base. No hyperdense vessel. Skull: No fracture or focal osseous lesion. Sinuses/Orbits: Minimal scattered mucosal thickening and small mucous retention cysts in the paranasal sinuses. Clear mastoid air cells. Unremarkable orbits. Other: None. IMPRESSION: 1. No evidence of acute intracranial abnormality. 2. Small chronic bilateral subdural hematomas, decreased in size on the right since 02/2019 with resolution of mass effect. 3. Severe chronic small vessel ischemic disease. Electronically Signed   By: Logan Bores M.D.   On: 09/28/2019 18:04    Medications:    Continuous Infusions: . sodium chloride 75 mL/hr at 09/29/19 0753  . cefTRIAXone (ROCEPHIN)  IV       LOS: 0 days   Geradine Girt  [facility]   How to contact the [facility] Attending or Consulting provider [place] or covering provider during after hours [place], for this patient?  1. Check the care team in [facility] and look for a) attending/consulting TRH provider listed and b) the [facility] team listed 2. Log into  [email or web address] and use [place]'s universal password to access. If you do not have the password, please contact the hospital operator. 3. Locate the [facility] provider you are looking for under [facility] and page to a number that you can be directly reached. 4. If you still have difficulty reaching the provider, please page the [facility] (Director on Call) for the Hospitalists listed on amion for assistance.  09/29/2019, 2:57 PM

## 2019-09-30 LAB — COMPREHENSIVE METABOLIC PANEL
ALT: 9 U/L (ref 0–44)
AST: 23 U/L (ref 15–41)
Albumin: 2.9 g/dL — ABNORMAL LOW (ref 3.5–5.0)
Alkaline Phosphatase: 58 U/L (ref 38–126)
Anion gap: 8 (ref 5–15)
BUN: 11 mg/dL (ref 8–23)
CO2: 23 mmol/L (ref 22–32)
Calcium: 9 mg/dL (ref 8.9–10.3)
Chloride: 110 mmol/L (ref 98–111)
Creatinine, Ser: 0.76 mg/dL (ref 0.61–1.24)
GFR calc Af Amer: >60 mL/min (ref >60)
GFR calc non Af Amer: >60 mL/min (ref >60)
Glucose, Bld: 105 mg/dL — ABNORMAL HIGH (ref 70–99)
Potassium: 3.5 mmol/L (ref 3.5–5.1)
Sodium: 141 mmol/L (ref 135–145)
Total Bilirubin: 0.4 mg/dL (ref 0.3–1.2)
Total Protein: 6 g/dL — ABNORMAL LOW (ref 6.5–8.1)

## 2019-09-30 LAB — CBC
HCT: 37.1 % — ABNORMAL LOW (ref 39.0–52.0)
Hemoglobin: 12.4 g/dL — ABNORMAL LOW (ref 13.0–17.0)
MCH: 31.1 pg (ref 26.0–34.0)
MCHC: 33.4 g/dL (ref 30.0–36.0)
MCV: 93 fL (ref 80.0–100.0)
Platelets: 179 10*3/uL (ref 150–400)
RBC: 3.99 MIL/uL — ABNORMAL LOW (ref 4.22–5.81)
RDW: 13.7 % (ref 11.5–15.5)
WBC: 5.8 10*3/uL (ref 4.0–10.5)
nRBC: 0 % (ref 0.0–0.2)

## 2019-09-30 NOTE — Social Work (Signed)
CSW called pt sister [name] at [phone]. No answer HIPAA compliant message left. Also called [name] at [phone]. She answered, was unable to talk at that time but is planning on coming to the hospital this afternoon to see pt. CSW will visit for assessment at that time. Pt currently recommended for SNF placement at discharge.   Westley Hummer, MSW, [place] Work [phone]

## 2019-09-30 NOTE — Progress Notes (Signed)
Progress Note    Keith Pittman  [id number] DOB: Dec 17, 1931  DOA: 09/28/2019 PCP: Lavone Orn, MD    Brief Narrative:     Medical records reviewed and are as summarized below:  Keith Pittman is an 85 y.o. male with medical history significant of subdural hematoma, frequent falls who was brought to the emergency department by his sister today because he was found to be confused more than his baseline and he also had frequent falls at home.  He was admitted and was discharged to skilled nursing facility on March 2020 after he was managed conservatively for  subdural hematoma. Patient was apparently doing fine after he was discharged from rehab to home.  He lives by himself but his sister checks and calls him every day.  Sister was at the bedside and all the information was taken from her.  As per her, patient fell on Monday.  Patient has walker and cane at home but he doesnot not like to use them.  Her sister has put rails around his room to to make easy for ambulation.  She usually calls every day to check on him.  This morning he did not pick his phone.  When she went to check on him, he was more weak than usual and confused.  Assessment/Plan:   Active Problems:   UTI (urinary tract infection)   Pressure injury of skin   Complicated urinary tract infection due to strep agalactiae: Brought for increased confusion.  UA impressive for UTI.  Blood culture sent.   - Started on ceftriaxone; will need at least 7 to 10 days of total antibiotic therapy -bladder scan PRN  Frequent fall/gait problem: Has history of frequent falls.  Has walker/cane at home but patient does not use.  Last fall on Monday.  Will request PT/OT evaluation- [facility]  History of subdural hematoma: Admitted on March 2020 for subdural hematoma.  Conservatively managed.  CT scan shows improvement  Sacrum deep tissue injury -POA    Family  Communication/Anticipated D/C date and plan/Code Status   DVT prophylaxis: scd Code Status: Full Code.  Family Communication: spoke with sister Disposition Plan: [facility] for [facility] to SNF tomorrow   Medical Consultants:    None.     Subjective:   Patient denies any acute complaints at this time including dysuria, fever, chills, nausea, vomiting, diarrhea.  Objective:    Vitals:   09/29/19 1404 09/29/19 2019 09/29/19 2030 09/30/19 0451  BP: 124/71 (!) 123/57  106/70  Pulse: 78 75  (!) 57  Resp: 14 19  15   Temp: 98.4 F (36.9 C) 98.9 F (37.2 C)  97.9 F (36.6 C)  TempSrc: Oral Oral  Oral  SpO2: 100% 99%  100%  Height:   5\' 11"  (1.803 m)     Intake/Output Summary (Last 24 hours) at 09/30/2019 1619 Last data filed at 09/30/2019 0646 Gross per 24 hour  Intake 887.06 ml  Output 775 ml  Net 112.06 ml   There were no vitals filed for this visit.  Exam: General: elderly male sitting in bed in no acute distress Heart: regular rate and rhythm  Lungs: Clear to auscultation bilaterally Abdomen: Soft, nontender, nondistended, bowel sounds present Neuro: Alert and oriented x3 to self, situation and place but not to time    Medications:   . divalproex  250 mg Oral QHS   Continuous Infusions: . cefTRIAXone (ROCEPHIN)  IV Stopped (09/29/19 2111)  LOS: 1 day   Lynda Wanninger  [facility]   How to contact the [facility] Attending or Consulting provider [place] or covering provider during after hours [place], for this patient?  1. Check the care team in [facility] and look for a) attending/consulting TRH provider listed and b) the [facility] team listed 2. Log into [email or web address] and use [place]'s universal password to access. If you do not have the password, please contact the hospital operator. 3. Locate the [facility] provider you are looking for under [facility] and page to a number that you can be directly reached. 4. If you still have difficulty reaching the  provider, please page the [facility] (Director on Call) for the Hospitalists listed on amion for assistance.  09/30/2019, 4:19 PM

## 2019-09-30 NOTE — TOC Initial Note (Signed)
Transition of Care [facility]) - Initial/Assessment Note    Patient Details  Name: Keith Pittman MRN: [id number] Date of Birth: 1932/09/25  Transition of [facility]) CM/SW Contact:    Keith Pittman, [place] Phone Number: 09/30/2019, 2:11 PM  Clinical Narrative:                 CSW spoke with pt and pt sister at bedside. CSW introduced self, role, reason for visit. Pt from home alone. He does not use any assistive devices to move around his home, only admitting to using grab bars and furniture to ambulate around the home. Pt states he would like to go home, but admits to falls and that he does not have 24/7 assistance. Pt sister describes supportive family but that they cannot be the ones to provide 24/7 assistance at discharge. Pt sister is interested in pt going to SNF and pt also reluctantly agrees. We discussed that pt may also be eligible for programs including PACE/VA programs. Will send referrals to SNFs and f/u with pt and pt sister on 11/6.   Expected Discharge Plan: Skilled Nursing Facility Barriers to Discharge: Continued Medical Work up   Patient Goals and CMS Choice Patient states their goals for this hospitalization and ongoing recovery are:: to go home and be by myself again CMS Medicare.gov Compare Post Acute Care list provided to:: Patient Represenative (must comment)(pt sister Keith Pittman) Choice offered to / list presented to : Patient, Sibling  Expected Discharge Plan and Services Expected Discharge Plan: [place] In-house Referral: Clinical Social Work Discharge Planning Services: CM Consult Post Acute Care Choice: [place] Living arrangements for the past 2 months: [place]  Prior Living Arrangements/Services Living arrangements for the past 2 months: Single Family Home Lives with:: Self Patient language and need for interpreter reviewed:: Yes(no needs) Do you feel safe going back to the place where you live?: Yes(pt sister states  she does not feel the same)      Need for Family Participation in Patient Care: Yes (Comment)(supervision/support with ADL/IADLs) Care giver support system in place?: Yes (comment) Current home services: DME Criminal Activity/Legal Involvement Pertinent to Current Situation/Hospitalization: No - Comment as needed  Activities of Daily Living Home Assistive Devices/Equipment: None ADL Screening (condition at time of admission) Patient's cognitive ability adequate to safely complete daily activities?: No Is the patient deaf or have difficulty hearing?: No Does the patient have difficulty seeing, even when wearing glasses/contacts?: No Does the patient have difficulty concentrating, remembering, or making decisions?: No Patient able to express need for assistance with ADLs?: Yes Does the patient have difficulty dressing or bathing?: No Independently performs ADLs?: Yes (appropriate for developmental age) Does the patient have difficulty walking or climbing stairs?: Yes Weakness of Legs: Both Weakness of Arms/Hands: None  Permission Sought/Granted Permission sought to share information with : Facility Sport and exercise psychologist, Family Supports Permission granted to share information with : Yes, Verbal Permission Granted  Share Information with NAME: Keith Pittman  Permission granted to share info w AGENCY: SNFs  Permission granted to share info w Relationship: sister  Permission granted to share info w Contact Information: [phone]  Emotional Assessment Appearance:: Appears stated age Attitude/Demeanor/Rapport: Guarded, Apprehensive Affect (typically observed): Flat, Frustrated, Guarded, Blunt Orientation: : Oriented to Self, Oriented to Place, Oriented to  Time, Oriented to Situation, Fluctuating Orientation (Suspected and/or reported Sundowners) Alcohol / Substance Use: Not Applicable Psych Involvement: No (comment)  Admission diagnosis:  Lower urinary tract infectious disease  [N39.0]  Altered mental status, unspecified altered mental status type [R41.82] Patient Active Problem List   Diagnosis Date Noted  . Pressure injury of skin 09/29/2019  . UTI (urinary tract infection) 09/28/2019  . Unwitnessed fall 02/02/2019  . Rhabdomyolysis 02/02/2019  . Elevated LFTs 02/02/2019  . Alcohol use 02/02/2019  . Subdural hematoma ([place]) 02/01/2019   PCP:  Lavone Orn, MD Pharmacy:   [id number] - [place], [place] [address] Phone: [phone] Fax: [phone]     Social Determinants of Health (SDOH) Interventions    Readmission Risk Interventions No flowsheet data found.

## 2019-10-01 LAB — SARS CORONAVIRUS 2 (TAT 6-24 HRS): SARS Coronavirus 2: NEGATIVE

## 2019-10-01 NOTE — Plan of Care (Signed)

## 2019-10-01 NOTE — Plan of Care (Signed)

## 2019-10-01 NOTE — Progress Notes (Signed)
Progress Note    KERN AHO  [id number] DOB: 1932-06-22  DOA: 09/28/2019 PCP: Lavone Orn, MD    Brief Narrative:     Medical records reviewed and are as summarized below:  Keith Pittman is an 85 y.o. male with medical history significant of subdural hematoma, frequent falls who was brought to the emergency department by his sister today because he was found to be confused more than his baseline and he also had frequent falls at home.  He was admitted and was discharged to skilled nursing facility on March 2020 after he was managed conservatively for  subdural hematoma. Patient was apparently doing fine after he was discharged from rehab to home.  He lives by himself but his sister checks and calls him every day.  Sister was at the bedside and all the information was taken from her.  As per her, patient fell on Monday.  Patient has walker and cane at home but he doesnot not like to use them.  Her sister has put rails around his room to to make easy for ambulation.  She usually calls every day to check on him.  This morning he did not pick his phone.  When she went to check on him, he was more weak than usual and confused.  Assessment/Plan:   Active Problems:   UTI (urinary tract infection)   Pressure injury of skin   Complicated urinary tract infection due to strep agalactiae: Brought for increased confusion.  UA impressive for UTI.  Blood culture sent.   - Started on ceftriaxone; will need at least 7 to 10 days of total antibiotic therapy -bladder scan PRN  Frequent fall/gait problem: Has history of frequent falls.  Has walker/cane at home but patient does not use.  Last fall on Monday.  Will request PT/OT evaluation- [facility] -Family has requested a psychiatric evaluation for decision-making capacity; ordered  History of subdural hematoma: Admitted on March 2020 for subdural hematoma.  Conservatively managed.  CT scan shows  improvement  Sacrum deep tissue injury -POA    Family Communication/Anticipated D/C date and plan/Code Status   DVT prophylaxis: scd Code Status: Full Code.  Family Communication: spoke with sister Disposition Plan: [facility] for discharge to SNF tomorrow pending psychiatric evaluation for  decision making capacity   Medical Consultants:    None.     Subjective:   Patient denies any acute complaints at this time including dysuria, fever, chills, nausea, vomiting, diarrhea.  Objective:    Vitals:   09/30/19 1705 09/30/19 2015 10/01/19 0547 10/01/19 1300  BP: (!) 134/52 131/67 (!) 132/56 134/66  Pulse: 60 67 (!) 56 61  Resp:  17 20 17   Temp: 98.7 F (37.1 C) 98.7 F (37.1 C) 98.1 F (36.7 C) 98.7 F (37.1 C)  TempSrc: Oral Oral Oral Oral  SpO2: 99% 99% 98% 99%  Height:        Intake/Output Summary (Last 24 hours) at 10/01/2019 1637 Last data filed at 10/01/2019 1058 Gross per 24 hour  Intake 360 ml  Output 1575 ml  Net -1215 ml   There were no vitals filed for this visit.  Exam: General: elderly male sitting in bed in no acute distress Heart: regular rate and rhythm  Lungs: Clear to auscultation bilaterally Abdomen: Soft, nontender, nondistended, bowel sounds present Neuro: Alert and oriented x3 to self    Medications:   . divalproex  250 mg Oral QHS   Continuous  Infusions: . cefTRIAXone (ROCEPHIN)  IV 1 g (09/30/19 1726)     LOS: 2 days   Langley Ingalls  [facility]   How to contact the [facility] Attending or Consulting provider [place] or covering provider during after hours [place], for this patient?  1. Check the care team in [facility] and look for a) attending/consulting TRH provider listed and b) the [facility] team listed 2. Log into [email or web address] and use [place]'s universal password to access. If you do not have the password, please contact the hospital operator. 3. Locate the [facility] provider you are looking for under [facility] and page to  a number that you can be directly reached. 4. If you still have difficulty reaching the provider, please page the [facility] (Director on Call) for the Hospitalists listed on amion for assistance.  10/01/2019, 4:37 PM

## 2019-10-01 NOTE — Care Management Important Message (Signed)
Important Message  Patient Details  Name: Keith Pittman MRN: [id number] Date of Birth: November 17, 1932   Medicare Important Message Given:  Yes     Shelda Altes 10/01/2019, 3:22 PM

## 2019-10-01 NOTE — Progress Notes (Signed)
Physical Therapy Treatment Patient Details Name: Keith Pittman MRN: [id number] DOB: 1932/02/17 Today's Date: 10/01/2019    History of Present Illness Pt is an 85 yo male presenting following a fall at home where the pt was discovered by his sister, and brought to the hospital where he was found to have AMS and UTI. Per pt sister, he is disoriented at baseline, with PMH of frequent falls, SDH min march 2020, DM II, and colon cancer.    PT Comments    Pt in bed upon PT arrival, but was restless and desiring to get up in chair to eat breakfast. Pt was able to demonstrate improved bed mobility with use of elevated HOB and rails, requiring only VCs from PT to come to sitting EOB. The pt also was able to demonstrate good tolerance for transfers, completing multiple sit-stands from low surfaces with use of RW and cues for hand placement. The patient completed a 5xsit-to-stand (5XSTS) in 32s with VCs for hand placement and RW. This is above the age-based cut-off of 14s and therefore indicates they are at increased risk for falls. The patient will therefore continue to benefit from skilled PT to address limitations in functional strength, mobility, and balance.   Follow Up Recommendations  SNF;Supervision/Assistance - 24 hour     Equipment Recommendations  Rolling walker with 5" wheels    Recommendations for Other Services       Precautions / Restrictions Precautions Precautions: Fall Precaution Comments: Per pt sister, he falls frequently at home (pt denies this). Pt STM is limited, making him a higher fall risk Restrictions Weight Bearing Restrictions: No    Mobility  Bed Mobility Overal bed mobility: Needs Assistance Bed Mobility: Supine to Sit     Supine to sit: HOB elevated;Min guard     General bed mobility comments: VCs for sequencing, pt able to complete with min guard with VCs for pt to fully rotate trunk to sitting EOB  Transfers Overall transfer level: Needs  assistance Equipment used: Rolling walker (2 wheeled) Transfers: Sit to/from Stand Sit to Stand: Min guard         General transfer comment: VCs necessary for hand placement, when pt stands without VCs he pulls on RW and had one small posterior LOB. pt then instructed to push from chair, and had no difficulty  Ambulation/Gait Ambulation/Gait assistance: Min guard Gait Distance (Feet): 10 Feet Assistive device: Rolling walker (2 wheeled) Gait Pattern/deviations: Step-through pattern;Decreased dorsiflexion - right;Decreased dorsiflexion - left     General Gait Details: Pt able to ambulate without assist, but requires support for guarding and supervision as he easily loses his balance. Pt also demos difficulty with RW navigation in tight spaces and benefits from supervision/minA of RW   Stairs             Wheelchair Mobility    Modified Rankin (Stroke Patients Only)       Balance Overall balance assessment: Needs assistance;History of Falls Sitting-balance support: No upper extremity supported;Feet supported Sitting balance-Leahy Scale: Good     Standing balance support: Bilateral upper extremity supported;During functional activity Standing balance-Leahy Scale: Fair Standing balance comment: Pt can stand without AD, but demos poor balance and reactions                            Cognition Arousal/Alertness: Awake/alert Behavior During Therapy: Restless Overall Cognitive Status: Impaired/Different from baseline Area of Impairment: Attention;Memory;Safety/judgement;Problem solving  Current Attention Level: Sustained Memory: Decreased short-term memory Following Commands: Follows multi-step commands with increased time;Follows one step commands with increased time Safety/Judgement: Decreased awareness of safety   Problem Solving: Slow processing;Decreased initiation;Difficulty sequencing;Requires verbal cues General Comments: Pt  with improved cooperation and ability to follow commands today during the session, improved probelm solving, but still benefits from some VCs for sequencing. Pt was restless upon PT arrival, wanting to eat breakfast and uncomfortabl in bed.      Exercises      General Comments General comments (skin integrity, edema, etc.): 5XSTS in 32 sec, this is above age cut-off of 14.8sec and indicates increased risk of falls.      Pertinent Vitals/Pain Pain Assessment: No/denies pain    Home Living                      Prior Function            PT Goals (current goals can now be found in the care plan section) Acute Rehab PT Goals Patient Stated Goal: return home as soon as possible PT Goal Formulation: With patient Time For Goal Achievement: 10/13/19 Potential to Achieve Goals: Fair Progress towards PT goals: Progressing toward goals    Frequency    Min 2X/week      PT Plan Current plan remains appropriate    Co-evaluation              AM-PAC PT "6 Clicks" Mobility   Outcome Measure  Help needed turning from your back to your side while in a flat bed without using bedrails?: None Help needed moving from lying on your back to sitting on the side of a flat bed without using bedrails?: A Little Help needed moving to and from a bed to a chair (including a wheelchair)?: A Little Help needed standing up from a chair using your arms (e.g., wheelchair or bedside chair)?: A Little Help needed to walk in hospital room?: A Little Help needed climbing 3-5 steps with a railing? : A Lot 6 Click Score: 18    End of Session Equipment Utilized During Treatment: Gait belt Activity Tolerance: Patient tolerated treatment well(pt desire to get up into chair for breakfast, not interested in further ambulation or activity at this point due to hunger and plans to d/c later today.) Patient left: in chair;with chair alarm set;with call bell/phone within reach Nurse Communication:  Mobility status PT Visit Diagnosis: Unsteadiness on feet (R26.81);Other abnormalities of gait and mobility (R26.89);Repeated falls (R29.6);History of falling (Z91.81)     Time: [id number] PT Time Calculation (min) (ACUTE ONLY): 12 min  Charges:  $Gait Training: 8-22 mins                     Mickey Farber, PT, DPT   Acute Rehabilitation Department [phone]   Otho Bellows 10/01/2019, 8:43 AM

## 2019-10-01 NOTE — TOC Progression Note (Signed)
Transition of Care [facility]) - Progression Note    Patient Details  Name: Keith Pittman MRN: [id number] Date of Birth: [birth year]  Transition of Care [facility]) CM/SW [place], [place] Phone Number: 10/01/2019, 4:42 PM  Clinical Narrative:    CSW met with pt sister privately to discuss care planning. [name] shared with RNCM and this writer her concerns that pt will need someone to assess his safety at home when he is completed rehab. She feels his memory is changing and although she was able to set him up with aides after the previous admission he was not accepting of them and she worries that will happen again after this discharge. CSW encouraged pt sister to seek support from both the SNF staff and community resources including but not limited to pt PCP who knows him well, VA as he is a English as a second language teacher and APS in [place]. CSW also provided PACE brochure to see if pt was eligible for those community supports as well.   Pt sister requesting capacity evaluation as she would like it to be on record if he does indeed need assistance with decision making at discharge. This Probation officer also encouraged pt sister to reach out to an Estate agent locally to also gain some assistance with care planning and ensuring future care needs were met. Pt sister amenable to resources and is still undecided about SNF placement. Encouraged her to focus on the immediate need for choice of SNF.    Expected Discharge Plan: [place] Barriers to Discharge: Continued Medical Work up  Expected Discharge Plan and Services Expected Discharge Plan: [place] In-house Referral: Clinical Social Work Discharge Planning Services: CM Consult Post Acute Care Choice: [place] Living arrangements for the past 2 months: Single Family Home   Social Determinants of Health (SDOH) Interventions    Readmission Risk Interventions Readmission Risk Prevention Plan 09/30/2019  Post  Dischage Appt Not Complete  Appt Comments plan for SNF  Medication Screening Complete  Transportation Screening Complete  Some recent data might be hidden

## 2019-10-01 NOTE — TOC Progression Note (Signed)
Transition of Care [facility]) - Progression Note    Patient Details  Name: Keith Pittman MRN: [id number] Date of Birth: 22-Oct-1932  Transition of Care [facility]) CM/SW Contact  Jacalyn Lefevre Edson Snowball, RN Phone Number: 10/01/2019, 11:26 AM  Clinical Narrative:    Provided bed offers. Medicare.gov list left at bedside. Called patient's sister [name] [phone] provided bed offers over phone. Offered to email Medicare.gov list with ratings, [name] will look up information herself. She chose between [facility] and [facility] and call NCM back.    Expected Discharge Plan: [place] Barriers to Discharge: Continued Medical Work up  Expected Discharge Plan and Services Expected Discharge Plan: [place] In-house Referral: Clinical Social Work Discharge Planning Services: CM Consult Post Acute Care Choice: [place] Living arrangements for the past 2 months: Single Family Home                                       Social Determinants of Health (SDOH) Interventions    Readmission Risk Interventions Readmission Risk Prevention Plan 09/30/2019  Post Dischage Appt Not Complete  Appt Comments plan for SNF  Medication Screening Complete  Transportation Screening Complete  Some recent data might be hidden

## 2019-10-02 DIAGNOSIS — R4182 Altered mental status, unspecified: Secondary | ICD-10-CM

## 2019-10-02 DIAGNOSIS — Z0489 Encounter for examination and observation for other specified reasons: Secondary | ICD-10-CM

## 2019-10-02 DIAGNOSIS — Z1330 Encounter for screening examination for mental health and behavioral disorders, unspecified: Secondary | ICD-10-CM

## 2019-10-02 DIAGNOSIS — L89001 Pressure ulcer of unspecified elbow, stage 1: Secondary | ICD-10-CM

## 2019-10-02 LAB — GLUCOSE, CAPILLARY: Glucose-Capillary: 96 mg/dL (ref 70–99)

## 2019-10-02 NOTE — TOC Progression Note (Addendum)
Transition of Care [facility]) - Progression Note    Patient Details  Name: Keith Pittman MRN: [id number] Date of Birth: [birth year]  Transition of Care [facility]) CM/SW [place], [place] Phone Number: 10/02/2019, 12:49 PM  Clinical Narrative:    3:26pm- CSW has reached out to [facility] to alert them for dc tomorrow. MD also inquires and is aware of this plan. Called sister [name] again and discussed psych evaluation- she is aware pt has capacity. She again expresses concern for his ability to care for himself at home and will reinforce SNF need with him. Again encouraged pt sister to seek eldercare attorney to assist with care planning.   12:49pm- Spoke with pt sister, her preference is for [facility]. We await psychiatric evaluation for capacity- will place hand off for CSW on Sunday to f/u for discharge.    Expected Discharge Plan: [place] Barriers to Discharge: Continued Medical Work up  Expected Discharge Plan and Services Expected Discharge Plan: [place] In-house Referral: Clinical Social Work Discharge Planning Services: CM Consult Post Acute Care Choice: [place] Living arrangements for the past 2 months: Single Family Home   Social Determinants of Health (SDOH) Interventions    Readmission Risk Interventions Readmission Risk Prevention Plan 09/30/2019  Post Dischage Appt Not Complete  Appt Comments plan for SNF  Medication Screening Complete  Transportation Screening Complete  Some recent data might be hidden

## 2019-10-02 NOTE — Progress Notes (Addendum)
Keith Pittman   PROGRESS NOTE  Keith Pittman [id number] DOB: 10-Oct-1932 DOA: 09/28/2019 PCP: Lavone Orn, MD  HPI/Recap of past 24 hours: HPI:  Dr. Theresa Duty: 85 y.o.malewith medical history significant ofsubdural hematoma, frequent falls who was brought to the emergency department by his sister today because he was found to be confused more than his baseline and he also had frequent falls at home. He was admitted and was discharged to skilled nursing facility on March 2020 after he was managed conservatively forsubdural hematoma. Patient was apparently doing fine after he was discharged from rehab to home. He lives by himself but his sister checks and calls him every day. Sister was at the bedside and all the information was taken from her. As per her, patient fell on Monday.Patient has walker and cane at home but he doesnotnot like to use them. Her sister has put rails around his room to to make easyfor ambulation. She usually calls every day to check on him. This morning he did not pick his phone. When she went to check on him,he was more weak than usual and confused. His sister [name] requested for psychiatric evaluation for mental capacity also she is making arrangement for him to be transferred to SNF.   Subjective: Patient seen and examined at bedside he stated that he is doing fine she was not quite agreeable to transferring to SNF for rehab he stated that nothing was wrong with him.  Assessment/Plan: Active Problems:   UTI (urinary tract infection)   Pressure injury of skin   Encounter for behavioral health screening #1 complicated urinary tract infection due to strep agalactiae.  Patient is currently on ceftriaxone.  He will will need to complete it for 7 to 10 days.  It was started on 09 October 2019.  2.  Frequent falls with gait problem.  Patient has walker and cane but he does not use them at home he said he can get around fine.  Arrangement is being made for physical  therapy and for SNF possibly discharge tomorrow  3.  Family history of subdural hematoma patient was admitted for subdural hematoma in March 2020 conservative management is advised  4.  Sacral deep tissue injury Present on admission  Code Status: Full  Severity of Illness: The appropriate patient status for this patient is INPATIENT. Inpatient status is judged to be reasonable and necessary in order to provide the required intensity of service to ensure the patient's safety. The patient's presenting symptoms, physical exam findings, and initial radiographic and laboratory data in the context of their chronic comorbidities is felt to place them at high risk for further clinical deterioration. Furthermore, it is not anticipated that the patient will be medically stable for discharge from the hospital within 2 midnights of admission. The following factors support the patient status of inpatient.   " The patient's presenting symptoms include generalized weakness. " The worrisome physical exam findings include is a question of capacity. " The initial radiographic and laboratory data are worrisome because of urinary tract infection. " The chronic co-morbidities include confusion.   * I certify that at the point of admission it is my clinical judgment that the patient will require inpatient hospital care spanning beyond 2 midnights from the point of admission due to high intensity of service, high risk for further deterioration and high frequency of surveillance required.*    Family Communication: None at bedside  Disposition Plan: SNF   Consultants:  Psychiatry  Procedures:  None  Antimicrobials:  Ceftriaxone  DVT prophylaxis: SCD   Objective: Vitals:   10/01/19 1300 10/01/19 2050 10/02/19 0552 10/02/19 1509  BP: 134/66 (!) 144/68 129/64 114/62  Pulse: 61 67 (!) 56 65  Resp: 17 20 19 16   Temp: 98.7 F (37.1 C) 98.6 F (37 C) 99.3 F (37.4 C) 98.2 F (36.8 C)  TempSrc:  Oral Oral Oral Oral  SpO2: 99% 98% 99% 99%  Height:        Intake/Output Summary (Last 24 hours) at 10/02/2019 1534 Last data filed at 10/02/2019 1413 Gross per 24 hour  Intake 840 ml  Output 1725 ml  Net -885 ml   There were no vitals filed for this visit. Body mass index is 28.93 kg/m.  Exam:  . General: 85 y.o. year-old male well developed well nourished in no acute distress.  Alert and oriented x3. . Cardiovascular: Regular rate and rhythm with no rubs or gallops.  No thyromegaly or JVD noted.   Keith Pittman Respiratory: Clear to auscultation with no wheezes or rales. Good inspiratory effort. . Abdomen: Soft nontender nondistended with normal bowel sounds x4 quadrants. . Musculoskeletal: No lower extremity edema. 2/4 pulses in all 4 extremities. . Skin: No ulcerative lesions noted or rashes, . Psychiatry: Mood is appropriate for condition and setting.  A little irritated    Data Reviewed: CBC: Recent Labs  Lab 09/28/19 1440 09/29/19 0121 09/30/19 0405  WBC 10.5 7.7 5.8  NEUTROABS 9.3*  --   --   HGB 14.8 13.1 12.4*  HCT 44.2 38.2* 37.1*  MCV 93.2 91.2 93.0  PLT 190 187 0000000   Basic Metabolic Panel: Recent Labs  Lab 09/28/19 1440 09/29/19 0121 09/30/19 0405  NA 141 139 141  K 4.3 3.4* 3.5  CL 107 109 110  CO2 24 21* 23  GLUCOSE 110* 99 105*  BUN 12 13 11   CREATININE 0.86 0.79 0.76  CALCIUM 9.8 9.3 9.0   GFR: CrCl cannot be calculated (Unknown ideal weight.). Liver Function Tests: Recent Labs  Lab 09/30/19 0405  AST 23  ALT 9  ALKPHOS 58  BILITOT 0.4  PROT 6.0*  ALBUMIN 2.9*   No results for input(s): LIPASE, AMYLASE in the last 168 hours. No results for input(s): AMMONIA in the last 168 hours. Coagulation Profile: No results for input(s): INR, PROTIME in the last 168 hours. Cardiac Enzymes: No results for input(s): CKTOTAL, CKMB, CKMBINDEX, TROPONINI in the last 168 hours. BNP (last 3 results) No results for input(s): PROBNP in the last 8760 hours.  HbA1C: No results for input(s): HGBA1C in the last 72 hours. CBG: Recent Labs  Lab 09/28/19 1355 10/02/19 0800  GLUCAP 93 96   Lipid Profile: No results for input(s): CHOL, HDL, LDLCALC, TRIG, CHOLHDL, LDLDIRECT in the last 72 hours. Thyroid Function Tests: No results for input(s): TSH, T4TOTAL, FREET4, T3FREE, THYROIDAB in the last 72 hours. Anemia Panel: No results for input(s): VITAMINB12, FOLATE, FERRITIN, TIBC, IRON, RETICCTPCT in the last 72 hours. Urine analysis:    Component Value Date/Time   COLORURINE YELLOW 09/28/2019 1507   APPEARANCEUR HAZY (A) 09/28/2019 1507   LABSPEC 1.013 09/28/2019 1507   PHURINE 6.0 09/28/2019 1507   GLUCOSEU NEGATIVE 09/28/2019 1507   HGBUR MODERATE (A) 09/28/2019 1507   BILIRUBINUR NEGATIVE 09/28/2019 1507   KETONESUR 5 (A) 09/28/2019 1507   PROTEINUR 30 (A) 09/28/2019 1507   NITRITE POSITIVE (A) 09/28/2019 1507   LEUKOCYTESUR LARGE (A) 09/28/2019 1507   Sepsis Labs: @LABRCNTIP (procalcitonin:4,lacticidven:4)  ) Recent Results (from the past  240 hour(s))  Urine culture     Status: Abnormal   Collection Time: 09/28/19  4:09 PM   Specimen: Urine, Catheterized  Result Value Ref Range Status   Specimen Description URINE, CATHETERIZED  Final   Special Requests NONE  Final   Culture (A)  Final    >=100,000 COLONIES/mL GROUP B STREP(S.AGALACTIAE)ISOLATED TESTING AGAINST S. AGALACTIAE NOT ROUTINELY PERFORMED DUE TO PREDICTABILITY OF AMP/PEN/VAN SUSCEPTIBILITY. Performed at [facility], [place] [address], [place], [place] [address]    Report Status 09/29/2019 FINAL  Final  SARS CORONAVIRUS 2 (TAT 6-24 HRS) Nasopharyngeal Nasopharyngeal Swab     Status: None   Collection Time: 09/28/19  4:41 PM   Specimen: Nasopharyngeal Swab  Result Value Ref Range Status   SARS Coronavirus 2 NEGATIVE NEGATIVE Final    Comment: (NOTE) SARS-CoV-2 target nucleic acids are NOT DETECTED. The SARS-CoV-2 RNA is generally detectable in upper and lower  respiratory specimens during the acute phase of infection. Negative results do not preclude SARS-CoV-2 infection, do not rule out co-infections with other pathogens, and should not be used as the sole basis for treatment or other patient management decisions. Negative results must be combined with clinical observations, patient history, and epidemiological information. The expected result is Negative. Fact Sheet for Patients: SugarRoll.be Fact Sheet for Healthcare Providers: https://www.woods-mathews.com/ This test is not yet approved or cleared by the [place] FDA and  has been authorized for detection and/or diagnosis of SARS-CoV-2 by FDA under an Emergency Use Authorization (EUA). This EUA will remain  in effect (meaning this test can be used) for the duration of the COVID-19 declaration under Section 56 4(b)(1) of the Act, 21 U.S.C. section 360bbb-3(b)(1), unless the authorization is terminated or revoked sooner. Performed at [facility], [place] [address] [place], [place] [address]   Culture, blood (routine x 2)     Status: None (Preliminary result)   Collection Time: 09/28/19  9:17 PM   Specimen: BLOOD  Result Value Ref Range Status   Specimen Description BLOOD LEFT ANTECUBITAL  Final   Special Requests   Final    BOTTLES DRAWN AEROBIC AND ANAEROBIC Blood Culture results may not be optimal due to an inadequate volume of blood received in culture bottles   Culture   Final    NO GROWTH 4 DAYS Performed at [facility], [place] [address] [place], [place] [address]    Report Status PENDING  Incomplete  Culture, blood (routine x 2)     Status: None (Preliminary result)   Collection Time: 09/28/19  9:19 PM   Specimen: BLOOD LEFT HAND  Result Value Ref Range Status   Specimen Description BLOOD LEFT HAND  Final   Special Requests   Final    BOTTLES DRAWN AEROBIC ONLY Blood Culture results may not be optimal due to an  inadequate volume of blood received in culture bottles   Culture   Final    NO GROWTH 4 DAYS Performed at [facility], [place] [address] [place],  [address]    Report Status PENDING  Incomplete  SARS CORONAVIRUS 2 (TAT 6-24 HRS) Nasopharyngeal Nasopharyngeal Swab     Status: None   Collection Time: 10/01/19  4:48 PM   Specimen: Nasopharyngeal Swab  Result Value Ref Range Status   SARS Coronavirus 2 NEGATIVE NEGATIVE Final    Comment: (NOTE) SARS-CoV-2 target nucleic acids are NOT DETECTED. The SARS-CoV-2 RNA is generally detectable in upper and lower respiratory specimens during the  acute phase of infection. Negative results do not preclude SARS-CoV-2 infection, do not rule out co-infections with other pathogens, and should not be used as the sole basis for treatment or other patient management decisions. Negative results must be combined with clinical observations, patient history, and epidemiological information. The expected result is Negative. Fact Sheet for Patients: SugarRoll.be Fact Sheet for Healthcare Providers: https://www.woods-mathews.com/ This test is not yet approved or cleared by the [place] FDA and  has been authorized for detection and/or diagnosis of SARS-CoV-2 by FDA under an Emergency Use Authorization (EUA). This EUA will remain  in effect (meaning this test can be used) for the duration of the COVID-19 declaration under Section 56 4(b)(1) of the Act, 21 U.S.C. section 360bbb-3(b)(1), unless the authorization is terminated or revoked sooner. Performed at [facility], [place] [address] [place], [place] [address]       Studies: No results found.  Scheduled Meds: . divalproex  250 mg Oral QHS    Continuous Infusions: . cefTRIAXone (ROCEPHIN)  IV 1 g (10/01/19 1712)     LOS: 3 days     Cristal Deer, MD [facility]  To reach me or the doctor on call, go to: [email or web address]  Password [facility]  10/02/2019, 3:34 PM

## 2019-10-02 NOTE — Consult Note (Addendum)
[facility] Consult   Reason for Consult:  Capacity  Referring Physician:  Dr. Theresa Duty Patient Identification: Keith Pittman MRN:  [id number] Principal Diagnosis: <principal problem not specified> Diagnosis:  Active Problems:   UTI (urinary tract infection)   Pressure injury of skin   Total Time spent with patient: 45 minutes  Subjective:   Keith Pittman is a 85 y.o. male patient reports today that he is doing fine. He states that he stumbbled coming out of the bathroom at home. He states that after he fell his sister came in and found him and she called the ambulance. He is very irritated about being in the hospital and wants to discharge home. He reports that he lives alone and he likes it that way. He answers all questions appropriately. He denies any suicidal or homicidal ideations and denies any hallucinations. He states stories about being in [facility] for 21 years 10 months and 14 days numerous times. He also repeats a story of being married once and they divorced and feels that he is the happiest he has ever been. He talks about how supportive his family is and how they check on him too much sometimes.   HPI:  Dr. Theresa Duty: 85 y.o. male with medical history significant ofsubdural hematoma, frequent falls who was brought to the emergency department by his sister today because he was found to be confused more than his baseline and he also had frequent falls at home. He was admitted and was discharged to skilled nursing facility on March 2020 after he was managed conservatively forsubdural hematoma. Patient was apparently doing fine after he was discharged from rehab to home. He lives by himself but his sister checks and calls him every day. Sister was at the bedside and all the information was taken from her. As per her, patient fell on Monday.Patient has walker and cane at home but he doesnotnot like to use them. Her sister has put rails around his room to  to make easyfor ambulation. She usually calls every day to check on him. This morning he did not pick his phone. When she went to check on him,he was more weak than usual and confused.  Past Psychiatric History: Denies any. Denies any suicide attempts, no medications, no hospitalizations  Risk to Self:   Risk to Others:   Prior Inpatient Therapy:   Prior Outpatient Therapy:    Past Medical History:  Past Medical History:  Diagnosis Date  . Cancer ([place])    colon  . Diabetes mellitus without complication [facility])     Past Surgical History:  Procedure Laterality Date  . COLON SURGERY     Family History: History reviewed. No pertinent family history. Family Psychiatric  History: Denies Social History:  Social History   Substance and Sexual Activity  Alcohol Use Yes     Social History   Substance and Sexual Activity  Drug Use Never    Social History   Socioeconomic History  . Marital status: Single    Spouse name: Not on file  . Number of children: Not on file  . Years of education: Not on file  . Highest education level: Not on file  Occupational History  . Not on file  Social Needs  . Financial resource strain: Not on file  . Food insecurity    Worry: Not on file    Inability: Not on file  . Transportation needs    Medical: Not on file    Non-medical:  Not on file  Tobacco Use  . Smoking status: Former Research scientist (life sciences)  . Smokeless tobacco: Never Used  Substance and Sexual Activity  . Alcohol use: Yes  . Drug use: Never  . Sexual activity: Not on file  Lifestyle  . Physical activity    Days per week: Not on file    Minutes per session: Not on file  . Stress: Not on file  Relationships  . Social Herbalist on phone: Not on file    Gets together: Not on file    Attends religious service: Not on file    Active member of club or organization: Not on file    Attends meetings of clubs or organizations: Not on file    Relationship status: Not on file   Other Topics Concern  . Not on file  Social History Narrative  . Not on file   Additional Social History:    Allergies:   Allergies  Allergen Reactions  . Sulfa Antibiotics Other (See Comments)    Sulfa-containing drugs (reaction??- occurred in childhood) [facility] has this listed, but no known reaction     Labs:  Results for orders placed or performed during the hospital encounter of 09/28/19 (from the past 48 hour(s))  SARS CORONAVIRUS 2 (TAT 6-24 HRS) Nasopharyngeal Nasopharyngeal Swab     Status: None   Collection Time: 10/01/19  4:48 PM   Specimen: Nasopharyngeal Swab  Result Value Ref Range   SARS Coronavirus 2 NEGATIVE NEGATIVE    Comment: (NOTE) SARS-CoV-2 target nucleic acids are NOT DETECTED. The SARS-CoV-2 RNA is generally detectable in upper and lower respiratory specimens during the acute phase of infection. Negative results do not preclude SARS-CoV-2 infection, do not rule out co-infections with other pathogens, and should not be used as the sole basis for treatment or other patient management decisions. Negative results must be combined with clinical observations, patient history, and epidemiological information. The expected result is Negative. Fact Sheet for Patients: SugarRoll.be Fact Sheet for Healthcare Providers: https://www.woods-mathews.com/ This test is not yet approved or cleared by the [place] FDA and  has been authorized for detection and/or diagnosis of SARS-CoV-2 by FDA under an Emergency Use Authorization (EUA). This EUA will remain  in effect (meaning this test can be used) for the duration of the COVID-19 declaration under Section 56 4(b)(1) of the Act, 21 U.S.C. section 360bbb-3(b)(1), unless the authorization is terminated or revoked sooner. Performed at [facility], [place] [address] [place], [place] [address]   Glucose, capillary     Status: None   Collection Time: 10/02/19  8:00 AM   Result Value Ref Range   Glucose-Capillary 96 70 - 99 mg/dL    Current Facility-Administered Medications  Medication Dose Route Frequency Provider Last Rate Last Dose  . acetaminophen (TYLENOL) tablet 325-650 mg  325-650 mg Oral Q6H PRN Shelly Coss, MD      . cefTRIAXone (ROCEPHIN) 1 g in sodium chloride 0.9 % 100 mL IVPB  1 g Intravenous Q24H Shelly Coss, MD 200 mL/hr at 10/01/19 1712 1 g at 10/01/19 1712  . divalproex (DEPAKOTE) DR tablet 250 mg  250 mg Oral QHS Eulogio Bear U, DO   250 mg at 10/01/19 2127    Musculoskeletal: Strength & Muscle Tone: decreased Gait & Station: Patient remained in bed during evaluation, ut is reported to be unsteady and needing to use a walker Patient leans: N/A  Psychiatric Specialty Exam: Physical Exam  Nursing note and vitals reviewed. Constitutional:  He is oriented to person, place, and time. He appears well-developed and well-nourished.  Respiratory: Effort normal.  Musculoskeletal: Normal range of motion.  Neurological: He is alert and oriented to person, place, and time.  Skin: Skin is warm.    Review of Systems  Constitutional: Negative.   HENT: Negative.   Eyes: Negative.   Respiratory: Negative.   Cardiovascular: Negative.   Gastrointestinal: Negative.   Genitourinary: Negative.   Musculoskeletal: Negative.   Skin: Negative.   Neurological: Negative.   Endo/Heme/Allergies: Negative.   Psychiatric/Behavioral: Negative.     Blood pressure 129/64, pulse (!) 56, temperature 99.3 F (37.4 C), temperature source Oral, resp. rate 19, height 5\' 11"  (1.803 m), SpO2 99 %.Body mass index is 28.93 kg/m.  General Appearance: Casual  Eye Contact:  Fair  Speech:  Clear and Coherent and Normal Rate  Volume:  Normal  Mood:  Irritable  Affect:  Congruent  Thought Process:  Coherent and Descriptions of Associations: Intact  Orientation:  Full (Time, Place, and Person)  Thought Content:  WDL and Logical  Suicidal Thoughts:  No   Homicidal Thoughts:  No  Memory:  Immediate;   Good Recent;   Good Remote;   Good  Judgement:  Fair  Insight:  Fair  Psychomotor Activity:  Normal  Concentration:  Concentration: Good  Recall:  Good  Fund of Knowledge:  Good  Language:  Good  Akathisia:  No  Handed:  Right  AIMS (if indicated):     Assets:  Agricultural consultant Housing Resilience Social Support Transportation  ADL's:    Cognition:  WNL  Sleep:        Treatment Plan Summary: Patient appears to be irritated about being in the hospital. He does repeat the same stories after a few minutes, but is still able to answer all questions correctly and has made logical decisions. He states taht he understands that he is supposed to use the walker at home but just doesn't want to because he can get around his house just fine. He does meet capacity for decision making. He is irritable about being in the hospital and feels that after years in the [facility] and living alone, he can manage on his own, but still realizes he needs his family to be there as support.   Disposition: No evidence of imminent risk to self or others at present.   Patient does not meet criteria for psychiatric inpatient admission.  [place], FNP 10/02/2019 12:34 PM   Attest to NP note

## 2019-10-03 LAB — CULTURE, BLOOD (ROUTINE X 2)
Culture: NO GROWTH
Culture: NO GROWTH

## 2019-10-03 NOTE — Progress Notes (Signed)
Pt alert and responsive with confusion,  his sister Wyona Almas asking why he is not discharging yet to [facility], he's brother is confused and he cannot take care of himself at home and needs therapy, will inform the attending and case manager,pt has  no untoward behavior noted today, he was OOB to chair, able to tolerate his meal well, still on antibiotic no s/s of adverse reaction.

## 2019-10-03 NOTE — TOC Progression Note (Signed)
Transition of Care [facility]) - Progression Note    Patient Details  Name: Keith Pittman MRN: [id number] Date of Birth: 1932/05/02  [facility] CM/SW [place], LCSW Phone Number: 10/03/2019, 2:12 PM  Clinical Narrative:   CSW met patient at bedside to discuss discharge plans. CSW stated that [facility] has a bed for patient. Patient stated he did not want to go to a rehab facility because "the people there are old and I don't belong in an old people place". CSW explained the importance of getting rehab. Patient was addiment about not going to a facility but then stated that if he can go for a short period of time he will try it out. Patient agreeable to try [facility] for 7 days.   Expected Discharge Plan: [place] Barriers to Discharge: Continued Medical Work up  Expected Discharge Plan and Services Expected Discharge Plan: [place] In-house Referral: Clinical Social Work Discharge Planning Services: CM Consult Post Acute Care Choice: [place] Living arrangements for the past 2 months: Single Family Home                                       Social Determinants of Health (SDOH) Interventions    Readmission Risk Interventions Readmission Risk Prevention Plan 09/30/2019  Post Dischage Appt Not Complete  Appt Comments plan for SNF  Medication Screening Complete  Transportation Screening Complete  Some recent data might be hidden

## 2019-10-03 NOTE — Progress Notes (Addendum)
Keith Pittman   PROGRESS NOTE  [name] [id number] DOB: [birth year] DOA: 09/28/2019 PCP: Lavone Orn, MD  HPI/Recap of past 24 hours: HPI:  Dr. Theresa Pittman: 85 y.o.malewith medical history significant ofsubdural hematoma, frequent falls who was brought to the emergency department by his sister today because he was found to be confused more than his baseline and he also had frequent falls at home. He was admitted and was discharged to skilled nursing facility on March 2020 after he was managed conservatively forsubdural hematoma. Patient was apparently doing fine after he was discharged from rehab to home. He lives by himself but his sister checks and calls him every day. Sister was at the bedside and all the information was taken from her. As per her, patient fell on Monday.Patient has walker and cane at home but he doesnotnot like to use them. Her sister has put rails around his room to to make easyfor ambulation. She usually calls every day to check on him. This morning he did not pick his phone. When she went to check on him,he was more weak than usual and confused.  His sister Gaspar Pittman requested for psychiatric evaluation for mental capacity also she is making arrangement for him to be transferred to SNF.   Subjective: Patient seen and examined at bedside he stated that he is doing fine she was not quite agreeable to transferring to SNF for rehab he stated that nothing was wrong with him.  Subjective October 03, 2019:  Patient seen and examined at bedside.  He is sitting in the recliner at the side of the bed.  He is complaining that he wanted to go back to  the bed  Assessment/Plan: Active Problems:   UTI (urinary tract infection)   Pressure injury of skin   Encounter for behavioral health screening #1 complicated urinary tract infection due to strep agalactiae.  Patient is currently on ceftriaxone.  He will will need to complete it for 7 to 10 days.  It was started on 09 October 2019.  2.  Frequent falls with gait problem.  Patient has walker and cane but he does not use them at home he said he can get around fine.  Arrangement is being made for physical therapy and for SNF possibly discharge tomorrow  3.  Family history of subdural hematoma patient was admitted for subdural hematoma in March 2020 conservative management is advised  4.  Sacral deep tissue injury Present on admission  Code Status: Full  Severity of Illness: The appropriate patient status for this patient is INPATIENT. Inpatient status is judged to be reasonable and necessary in order to provide the required intensity of service to ensure the patient's safety. The patient's presenting symptoms, physical exam findings, and initial radiographic and laboratory data in the context of their chronic comorbidities is felt to place them at high risk for further clinical deterioration. Furthermore, it is not anticipated that the patient will be medically stable for discharge from the hospital within 2 midnights of admission. The following factors support the patient status of inpatient.   " The patient's presenting symptoms include generalized weakness. " The worrisome physical exam findings include is a question of capacity. " The initial radiographic and laboratory data are worrisome because of urinary tract infection. " The chronic co-morbidities include confusion.   * I certify that at the point of admission it is my clinical judgment that the patient will require inpatient hospital care spanning beyond 2 midnights from the point of admission due  to high intensity of service, high risk for further deterioration and high frequency of surveillance required.*    Family Communication: Spoke with Gaspar Pittman his sister who stated that she wants him to go for SNF because he gets intermittently confused and that she knows him she has been the one taking care of him.  Also that the same thing happened in March  2020 after a fall when he had a subdural hematoma he signed himself out and he has not been able to function well.  She stated that for his safety she would like for him to be discharged to SNF despite what his opinion might be and despite the fact that the psychiatrist thinks that he is competent to make his decisions.  Disposition Plan: SNF   Consultants:  Psychiatry  Procedures:  None  Antimicrobials:  Ceftriaxone  DVT prophylaxis: SCD   Objective: Vitals:   10/02/19 1509 10/02/19 2025 10/03/19 0336 10/03/19 0831  BP: 114/62 121/66 118/69 120/69  Pulse: 65 62 63 75  Resp: 16 18 18 17   Temp: 98.2 F (36.8 C) 98 F (36.7 C) 98.2 F (36.8 C) 98.2 F (36.8 C)  TempSrc: Oral Axillary Axillary Oral  SpO2: 99% 97% 98% 96%  Height:        Intake/Output Summary (Last 24 hours) at 10/03/2019 1208 Last data filed at 10/03/2019 0900 Gross per 24 hour  Intake 420 ml  Output 1525 ml  Net -1105 ml   There were no vitals filed for this visit. Body mass index is 28.93 kg/m.  Exam:  . General: 85 y.o. year-old male well developed well nourished in no acute distress.  Alert and oriented x3. . Cardiovascular: Regular rate and rhythm with no rubs or gallops.  No thyromegaly or JVD noted.   Keith Pittman Respiratory: Clear to auscultation with no wheezes or rales. Good inspiratory effort. . Abdomen: Soft nontender nondistended with normal bowel sounds x4 quadrants. . Musculoskeletal: No lower extremity edema. 2/4 pulses in all 4 extremities. . Skin: No ulcerative lesions noted or rashes, . Psychiatry: Mood is appropriate for condition and setting.  A little irritated    Data Reviewed: CBC: Recent Labs  Lab 09/28/19 1440 09/29/19 0121 09/30/19 0405  WBC 10.5 7.7 5.8  NEUTROABS 9.3*  --   --   HGB 14.8 13.1 12.4*  HCT 44.2 38.2* 37.1*  MCV 93.2 91.2 93.0  PLT 190 187 0000000   Basic Metabolic Panel: Recent Labs  Lab 09/28/19 1440 09/29/19 0121 09/30/19 0405  NA 141 139 141   K 4.3 3.4* 3.5  CL 107 109 110  CO2 24 21* 23  GLUCOSE 110* 99 105*  BUN 12 13 11   CREATININE 0.86 0.79 0.76  CALCIUM 9.8 9.3 9.0   GFR: CrCl cannot be calculated (Unknown ideal weight.). Liver Function Tests: Recent Labs  Lab 09/30/19 0405  AST 23  ALT 9  ALKPHOS 58  BILITOT 0.4  PROT 6.0*  ALBUMIN 2.9*   No results for input(s): LIPASE, AMYLASE in the last 168 hours. No results for input(s): AMMONIA in the last 168 hours. Coagulation Profile: No results for input(s): INR, PROTIME in the last 168 hours. Cardiac Enzymes: No results for input(s): CKTOTAL, CKMB, CKMBINDEX, TROPONINI in the last 168 hours. BNP (last 3 results) No results for input(s): PROBNP in the last 8760 hours. HbA1C: No results for input(s): HGBA1C in the last 72 hours. CBG: Recent Labs  Lab 09/28/19 1355 10/02/19 0800  GLUCAP 93 96   Lipid Profile: No  results for input(s): CHOL, HDL, LDLCALC, TRIG, CHOLHDL, LDLDIRECT in the last 72 hours. Thyroid Function Tests: No results for input(s): TSH, T4TOTAL, FREET4, T3FREE, THYROIDAB in the last 72 hours. Anemia Panel: No results for input(s): VITAMINB12, FOLATE, FERRITIN, TIBC, IRON, RETICCTPCT in the last 72 hours. Urine analysis:    Component Value Date/Time   COLORURINE YELLOW 09/28/2019 1507   APPEARANCEUR HAZY (A) 09/28/2019 1507   LABSPEC 1.013 09/28/2019 1507   PHURINE 6.0 09/28/2019 1507   GLUCOSEU NEGATIVE 09/28/2019 1507   HGBUR MODERATE (A) 09/28/2019 1507   BILIRUBINUR NEGATIVE 09/28/2019 1507   KETONESUR 5 (A) 09/28/2019 1507   PROTEINUR 30 (A) 09/28/2019 1507   NITRITE POSITIVE (A) 09/28/2019 1507   LEUKOCYTESUR LARGE (A) 09/28/2019 1507   Sepsis Labs: @LABRCNTIP (procalcitonin:4,lacticidven:4)  ) Recent Results (from the past 240 hour(s))  Urine culture     Status: Abnormal   Collection Time: 09/28/19  4:09 PM   Specimen: Urine, Catheterized  Result Value Ref Range Status   Specimen Description URINE, CATHETERIZED  Final    Special Requests NONE  Final   Culture (A)  Final    >=100,000 COLONIES/mL GROUP B STREP(S.AGALACTIAE)ISOLATED TESTING AGAINST S. AGALACTIAE NOT ROUTINELY PERFORMED DUE TO PREDICTABILITY OF AMP/PEN/VAN SUSCEPTIBILITY. Performed at [facility], [place] [address], [place], [place] [address]    Report Status 09/29/2019 FINAL  Final  SARS CORONAVIRUS 2 (TAT 6-24 HRS) Nasopharyngeal Nasopharyngeal Swab     Status: None   Collection Time: 09/28/19  4:41 PM   Specimen: Nasopharyngeal Swab  Result Value Ref Range Status   SARS Coronavirus 2 NEGATIVE NEGATIVE Final    Comment: (NOTE) SARS-CoV-2 target nucleic acids are NOT DETECTED. The SARS-CoV-2 RNA is generally detectable in upper and lower respiratory specimens during the acute phase of infection. Negative results do not preclude SARS-CoV-2 infection, do not rule out co-infections with other pathogens, and should not be used as the sole basis for treatment or other patient management decisions. Negative results must be combined with clinical observations, patient history, and epidemiological information. The expected result is Negative. Fact Sheet for Patients: SugarRoll.be Fact Sheet for Healthcare Providers: https://www.woods-mathews.com/ This test is not yet approved or cleared by the [place] FDA and  has been authorized for detection and/or diagnosis of SARS-CoV-2 by FDA under an Emergency Use Authorization (EUA). This EUA will remain  in effect (meaning this test can be used) for the duration of the COVID-19 declaration under Section 56 4(b)(1) of the Act, 21 U.S.C. section 360bbb-3(b)(1), unless the authorization is terminated or revoked sooner. Performed at [facility], [place] [address] [place], [place] [address]   Culture, blood (routine x 2)     Status: None   Collection Time: 09/28/19  9:17 PM   Specimen: BLOOD  Result Value Ref Range Status   Specimen Description  BLOOD LEFT ANTECUBITAL  Final   Special Requests   Final    BOTTLES DRAWN AEROBIC AND ANAEROBIC Blood Culture results may not be optimal due to an inadequate volume of blood received in culture bottles   Culture   Final    NO GROWTH 5 DAYS Performed at [facility], [place] [address], [place], [place] [address]    Report Status 10/03/2019 FINAL  Final  Culture, blood (routine x 2)     Status: None   Collection Time: 09/28/19  9:19 PM   Specimen: BLOOD LEFT HAND  Result Value Ref Range Status   Specimen Description BLOOD LEFT HAND  Final   Special Requests   Final    BOTTLES DRAWN AEROBIC ONLY Blood Culture results may not be optimal due to an inadequate volume of blood received in culture bottles   Culture   Final    NO GROWTH 5 DAYS Performed at [facility], [place] [address] [place], [place] [address]    Report Status 10/03/2019 FINAL  Final  SARS CORONAVIRUS 2 (TAT 6-24 HRS) Nasopharyngeal Nasopharyngeal Swab     Status: None   Collection Time: 10/01/19  4:48 PM   Specimen: Nasopharyngeal Swab  Result Value Ref Range Status   SARS Coronavirus 2 NEGATIVE NEGATIVE Final    Comment: (NOTE) SARS-CoV-2 target nucleic acids are NOT DETECTED. The SARS-CoV-2 RNA is generally detectable in upper and lower respiratory specimens during the acute phase of infection. Negative results do not preclude SARS-CoV-2 infection, do not rule out co-infections with other pathogens, and should not be used as the sole basis for treatment or other patient management decisions. Negative results must be combined with clinical observations, patient history, and epidemiological information. The expected result is Negative. Fact Sheet for Patients: SugarRoll.be Fact Sheet for Healthcare Providers: https://www.woods-mathews.com/ This test is not yet approved or cleared by the [place] FDA and  has been authorized for detection and/or diagnosis of  SARS-CoV-2 by FDA under an Emergency Use Authorization (EUA). This EUA will remain  in effect (meaning this test can be used) for the duration of the COVID-19 declaration under Section 56 4(b)(1) of the Act, 21 U.S.C. section 360bbb-3(b)(1), unless the authorization is terminated or revoked sooner. Performed at [facility], [place] [address] [place], [place] [address]       Studies: No results found.  Scheduled Meds: . divalproex  250 mg Oral QHS    Continuous Infusions: . cefTRIAXone (ROCEPHIN)  IV 1 g (10/02/19 1639)     LOS: 4 days     Cristal Deer, MD [facility]  To reach me or the doctor on call, go to: [email or web address] Password [facility]  10/03/2019, 12:08 PM

## 2019-10-04 DIAGNOSIS — R2689 Other abnormalities of gait and mobility: Secondary | ICD-10-CM | POA: Diagnosis not present

## 2019-10-04 DIAGNOSIS — Z9181 History of falling: Secondary | ICD-10-CM | POA: Diagnosis not present

## 2019-10-04 DIAGNOSIS — B951 Streptococcus, group B, as the cause of diseases classified elsewhere: Secondary | ICD-10-CM | POA: Diagnosis not present

## 2019-10-04 DIAGNOSIS — R52 Pain, unspecified: Secondary | ICD-10-CM | POA: Diagnosis not present

## 2019-10-04 DIAGNOSIS — F4324 Adjustment disorder with disturbance of conduct: Secondary | ICD-10-CM | POA: Diagnosis not present

## 2019-10-04 DIAGNOSIS — N390 Urinary tract infection, site not specified: Secondary | ICD-10-CM | POA: Diagnosis not present

## 2019-10-04 DIAGNOSIS — S065X9A Traumatic subdural hemorrhage with loss of consciousness of unspecified duration, initial encounter: Secondary | ICD-10-CM | POA: Diagnosis not present

## 2019-10-04 DIAGNOSIS — R2681 Unsteadiness on feet: Secondary | ICD-10-CM | POA: Diagnosis not present

## 2019-10-04 DIAGNOSIS — M2550 Pain in unspecified joint: Secondary | ICD-10-CM | POA: Diagnosis not present

## 2019-10-04 DIAGNOSIS — I872 Venous insufficiency (chronic) (peripheral): Secondary | ICD-10-CM | POA: Diagnosis not present

## 2019-10-04 DIAGNOSIS — M6281 Muscle weakness (generalized): Secondary | ICD-10-CM | POA: Diagnosis not present

## 2019-10-04 DIAGNOSIS — Z7401 Bed confinement status: Secondary | ICD-10-CM | POA: Diagnosis not present

## 2019-10-04 DIAGNOSIS — R5381 Other malaise: Secondary | ICD-10-CM | POA: Diagnosis not present

## 2019-10-04 DIAGNOSIS — N3000 Acute cystitis without hematuria: Secondary | ICD-10-CM | POA: Diagnosis not present

## 2019-10-04 DIAGNOSIS — R41841 Cognitive communication deficit: Secondary | ICD-10-CM | POA: Diagnosis not present

## 2019-10-04 MED ORDER — DIVALPROEX SODIUM 250 MG PO DR TAB: 250.0000 mg | DELAYED_RELEASE_TABLET | Freq: Every day | ORAL | 0 refills | Status: DC

## 2019-10-04 MED ORDER — CEPHALEXIN 500 MG PO CAPS: 500.0000 mg | ORAL_CAPSULE | Freq: Three times a day (TID) | ORAL | 0 refills | Status: AC

## 2019-10-04 NOTE — Progress Notes (Signed)
Discharged [facility] to [facility] via [facility]. Report given to receiving RN at the facility.

## 2019-10-04 NOTE — Progress Notes (Signed)
Report given to [place], Therapist, sports at [facility].

## 2019-10-04 NOTE — Progress Notes (Signed)
Called for report, RN unable to answer. Will try again.

## 2019-10-04 NOTE — Discharge Summary (Signed)
Discharge Summary  Keith Pittman [id number] DOB: [birth year]  PCP: Keith Orn, MD  Admit date: 09/28/2019 Discharge date: 10/04/2019  Time spent: 30 minutes  Recommendations for Outpatient Follow-up:  1. Primary care provider  Discharge Diagnoses:  Active Hospital Problems   Diagnosis Date Noted   Encounter for behavioral health screening 10/02/2019   Pressure injury of skin 09/29/2019   UTI (urinary tract infection) 09/28/2019    Resolved Hospital Problems  No resolved problems to display.    Discharge Condition: Improved  Diet recommendation: Regular  Vitals:   10/03/19 2058 10/04/19 0425  BP: 121/65 (!) 117/59  Pulse: 73 61  Resp: 18 14  Temp: 98.8 F (37.1 C) 98.4 F (36.9 C)  SpO2: 97% 97%    History of present illness:   HPI:Dr. Satsangi:85 y.o.malewith medical history significant ofsubdural hematoma, frequent falls who was brought to the emergency department by his sister today because he was found to be confused more than his baseline and he also had frequent falls at home. He was admitted and was discharged to skilled nursing facility on March 2020 after he was managed conservatively forsubdural hematoma. Patient was apparently doing fine after he was discharged from rehab to home. He lives by himself but his sister checks and calls him every day. Sister was at the bedside and all the information was taken from her. As per her, patient fell on Monday.Patient has walker and cane at home but he doesnotnot like to use them. Her sister has put rails around his room to to make easyfor ambulation. She usually calls every day to check on him. This morning he did not pick his phone. When she went to check on him,he was more weak than usual and confused. His sister Keith Pittman requested for psychiatric evaluation for mental capacity also she is making arrangement for him to be transferred to SNF.  Hospital Course:  Active Problems:   UTI  (urinary tract infection)   Pressure injury of skin   Encounter for behavioral health screening  Patient was admitted for fall with altered mental status.  He was found to have urinary tract infection which is most likely contributing to that he was treated with ceftriaxone due to his UTI complicated by strep agalactiae.  He has been transitioned to p.o. Keflex to complete another 5 days. His sister Keith Pittman requested a psychiatric evaluation for mental capacity which was done on October 02, 2019 by the psychiatrist who did not feel that he lacked capacity. Keith Pittman his sister who stated that she wants him to go for SNF because he gets intermittently confused and that she knows him she has been the one taking care of him.  Also that the same thing happened in March 2020 after a fall when he had a subdural hematoma he signed himself out and he has not been able to function well.  She stated that for his safety she would like for him to be discharged to SNF despite what his opinion might be and despite the fact that the psychiatrist thinks that he is competent to make his decisions.  Procedures:  None  Consultations:  Psychiatry  Discharge Exam: BP (!) 117/59 (BP Location: Left Arm)    Pulse 61    Temp 98.4 F (36.9 C) (Oral)    Resp 14    Ht 5\' 11"  (1.803 m)    SpO2 97%    BMI 28.93 kg/m   General: Alert oriented x3 obese Cardiovascular: Regular rate and rhythm no murmur  Respiratory: Work of breathing is normal, CTA bilaterally  Discharge Instructions You were cared for by a hospitalist during your hospital stay. If you have any questions about your discharge medications or the care you received while you were in the hospital after you are discharged, you can call the unit and asked to speak with the hospitalist on call if the hospitalist that took care of you is not available. Once you are discharged, your primary care physician will handle any further medical issues. Please note that NO  REFILLS for any discharge medications will be authorized once you are discharged, as it is imperative that you return to your primary care physician (or establish a relationship with a primary care physician if you do not have one) for your aftercare needs so that they can reassess your need for medications and monitor your lab values.  Discharge Instructions    Diet - low sodium heart healthy   Complete by: As directed    Discharge instructions   Complete by: As directed    Follow-up with primary care provider   Increase activity slowly   Complete by: As directed    Restrictions Precautions Precautions: Fall Precaution Comments: Per pt sister, he falls frequently at home (pt denies this). Pt STM is limited, making him a higher fall risk   Per physical therapy     Allergies as of 10/04/2019      Reactions   Sulfa Antibiotics Other (See Comments)   Sulfa-containing drugs (reaction??- occurred in childhood) [facility] has this listed, but no known reaction      Medication List    STOP taking these medications   bismuth subsalicylate [id number] suspension Commonly known as: PEPTO BISMOL     TAKE these medications   acetaminophen 325 MG tablet Commonly known as: TYLENOL Take 325-650 mg by mouth every 6 (six) hours as needed for headache.   cephALEXin 500 MG capsule Commonly known as: KEFLEX Take 1 capsule (500 mg total) by mouth 3 (three) times daily for 5 days. For uti to complete tx from iv rocephin   divalproex 250 MG DR tablet Commonly known as: DEPAKOTE Take 1 tablet (250 mg total) by mouth at bedtime.      Allergies  Allergen Reactions   Sulfa Antibiotics Other (See Comments)    Sulfa-containing drugs (reaction??- occurred in childhood) [facility] has this listed, but no known reaction    Contact information for after-discharge care    Destination    [facility] Preferred SNF .   Service: Skilled Nursing Contact information: [place] [phone]               The results of significant diagnostics from this hospitalization (including imaging, microbiology, ancillary and laboratory) are listed below for reference.    Significant Diagnostic Studies: Ct Head Wo Contrast  Result Date: 09/28/2019 CLINICAL DATA:  Confusion. Fall yesterday. History of subdural hematoma. EXAM: CT HEAD WITHOUT CONTRAST TECHNIQUE: Contiguous axial images were obtained from the base of the skull through the vertex without intravenous contrast. COMPARISON:  03/15/2019 FINDINGS: Brain: The subdural hematoma over the right cerebral convexity on the prior CT has greatly decreased in size with a residual hypoattenuating collection measuring up to 4 mm in thickness. Mass effect on the right cerebrum has resolved, and there is no residual midline shift. A hypoattenuating subdural hematoma over the left cerebral convexity measures up to 5 mm in thickness and is unchanged. No acute intracranial hemorrhage, acute infarct,  or mass is identified. There is mild cerebral atrophy. Patchy and confluent hypodensities in the cerebral white matter bilaterally are unchanged and nonspecific but compatible with severe chronic small vessel ischemic disease. Left pontine calcification is unchanged. Vascular: Calcified atherosclerosis at the skull base. No hyperdense vessel. Skull: No fracture or focal osseous lesion. Sinuses/Orbits: Minimal scattered mucosal thickening and small mucous retention cysts in the paranasal sinuses. Clear mastoid air cells. Unremarkable orbits. Other: None. IMPRESSION: 1. No evidence of acute intracranial abnormality. 2. Small chronic bilateral subdural hematomas, decreased in size on the right since 02/2019 with resolution of mass effect. 3. Severe chronic small vessel ischemic disease. Electronically Signed   By: Logan Bores M.D.   On: 09/28/2019 18:04    Microbiology: Recent Results (from the past 240 hour(s))  Urine culture     Status:  Abnormal   Collection Time: 09/28/19  4:09 PM   Specimen: Urine, Catheterized  Result Value Ref Range Status   Specimen Description URINE, CATHETERIZED  Final   Special Requests NONE  Final   Culture (A)  Final    >=100,000 COLONIES/mL GROUP B STREP(S.AGALACTIAE)ISOLATED TESTING AGAINST S. AGALACTIAE NOT ROUTINELY PERFORMED DUE TO PREDICTABILITY OF AMP/PEN/VAN SUSCEPTIBILITY. Performed at [facility], [place] [address] [place], [place] [address]    Report Status 09/29/2019 FINAL  Final  SARS CORONAVIRUS 2 (TAT 6-24 HRS) Nasopharyngeal Nasopharyngeal Swab     Status: None   Collection Time: 09/28/19  4:41 PM   Specimen: Nasopharyngeal Swab  Result Value Ref Range Status   SARS Coronavirus 2 NEGATIVE NEGATIVE Final    Comment: (NOTE) SARS-CoV-2 target nucleic acids are NOT DETECTED. The SARS-CoV-2 RNA is generally detectable in upper and lower respiratory specimens during the acute phase of infection. Negative results do not preclude SARS-CoV-2 infection, do not rule out co-infections with other pathogens, and should not be used as the sole basis for treatment or other patient management decisions. Negative results must be combined with clinical observations, patient history, and epidemiological information. The expected result is Negative. Fact Sheet for Patients: SugarRoll.be Fact Sheet for Healthcare Providers: https://www.woods-mathews.com/ This test is not yet approved or cleared by the [place] FDA and  has been authorized for detection and/or diagnosis of SARS-CoV-2 by FDA under an Emergency Use Authorization (EUA). This EUA will remain  in effect (meaning this test can be used) for the duration of the COVID-19 declaration under Section 56 4(b)(1) of the Act, 21 U.S.C. section 360bbb-3(b)(1), unless the authorization is terminated or revoked sooner. Performed at [facility], [place] [address] [place],  [place] [address]   Culture, blood (routine x 2)     Status: None   Collection Time: 09/28/19  9:17 PM   Specimen: BLOOD  Result Value Ref Range Status   Specimen Description BLOOD LEFT ANTECUBITAL  Final   Special Requests   Final    BOTTLES DRAWN AEROBIC AND ANAEROBIC Blood Culture results may not be optimal due to an inadequate volume of blood received in culture bottles   Culture   Final    NO GROWTH 5 DAYS Performed at [facility], [place] [address], [place], [place] [address]    Report Status 10/03/2019 FINAL  Final  Culture, blood (routine x 2)     Status: None   Collection Time: 09/28/19  9:19 PM   Specimen: BLOOD LEFT HAND  Result Value Ref Range Status   Specimen Description BLOOD LEFT HAND  Final   Special Requests  Final    BOTTLES DRAWN AEROBIC ONLY Blood Culture results may not be optimal due to an inadequate volume of blood received in culture bottles   Culture   Final    NO GROWTH 5 DAYS Performed at [facility], [place] [address] [place], [place] [address]    Report Status 10/03/2019 FINAL  Final  SARS CORONAVIRUS 2 (TAT 6-24 HRS) Nasopharyngeal Nasopharyngeal Swab     Status: None   Collection Time: 10/01/19  4:48 PM   Specimen: Nasopharyngeal Swab  Result Value Ref Range Status   SARS Coronavirus 2 NEGATIVE NEGATIVE Final    Comment: (NOTE) SARS-CoV-2 target nucleic acids are NOT DETECTED. The SARS-CoV-2 RNA is generally detectable in upper and lower respiratory specimens during the acute phase of infection. Negative results do not preclude SARS-CoV-2 infection, do not rule out co-infections with other pathogens, and should not be used as the sole basis for treatment or other patient management decisions. Negative results must be combined with clinical observations, patient history, and epidemiological information. The expected result is Negative. Fact Sheet for Patients: SugarRoll.be Fact Sheet for Healthcare  Providers: https://www.woods-mathews.com/ This test is not yet approved or cleared by the [place] FDA and  has been authorized for detection and/or diagnosis of SARS-CoV-2 by FDA under an Emergency Use Authorization (EUA). This EUA will remain  in effect (meaning this test can be used) for the duration of the COVID-19 declaration under Section 56 4(b)(1) of the Act, 21 U.S.C. section 360bbb-3(b)(1), unless the authorization is terminated or revoked sooner. Performed at [facility], [place] [address], [place], [place] [address]      Labs: Basic Metabolic Panel: Recent Labs  Lab 09/28/19 1440 09/29/19 0121 09/30/19 0405  NA 141 139 141  K 4.3 3.4* 3.5  CL 107 109 110  CO2 24 21* 23  GLUCOSE 110* 99 105*  BUN 12 13 11   CREATININE 0.86 0.79 0.76  CALCIUM 9.8 9.3 9.0   Liver Function Tests: Recent Labs  Lab 09/30/19 0405  AST 23  ALT 9  ALKPHOS 58  BILITOT 0.4  PROT 6.0*  ALBUMIN 2.9*   No results for input(s): LIPASE, AMYLASE in the last 168 hours. No results for input(s): AMMONIA in the last 168 hours. CBC: Recent Labs  Lab 09/28/19 1440 09/29/19 0121 09/30/19 0405  WBC 10.5 7.7 5.8  NEUTROABS 9.3*  --   --   HGB 14.8 13.1 12.4*  HCT 44.2 38.2* 37.1*  MCV 93.2 91.2 93.0  PLT 190 187 179   Cardiac Enzymes: No results for input(s): CKTOTAL, CKMB, CKMBINDEX, TROPONINI in the last 168 hours. BNP: BNP (last 3 results) No results for input(s): BNP in the last 8760 hours.  ProBNP (last 3 results) No results for input(s): PROBNP in the last 8760 hours.  CBG: Recent Labs  Lab 09/28/19 1355 10/02/19 0800  GLUCAP 93 96       Signed:  Cristal Deer, MD [facility] 10/04/2019, 8:53 AM

## 2019-10-04 NOTE — Social Work (Signed)
Clinical Social Worker facilitated patient discharge including contacting patient family and facility to confirm patient discharge plans.  Clinical information faxed to facility and family agreeable with plan.  CSW arranged ambulance transport via [facility] to [facility].  RN to call [phone]  with report prior to discharge.  Clinical Social Worker will sign off for now as social work intervention is no longer needed. Please consult us again if new need arises.  Kathlyn Leachman, MSW, LCSWA Clinical Social Worker [phone]  

## 2019-10-04 NOTE — Discharge Instructions (Signed)
Antibiotic Medicine, Adult  Antibiotic medicines treat infections caused by a type of germ called bacteria. They work by killing the bacteria that make you sick. When do I need to take antibiotics? You often need these medicines to treat bacterial infections, such as:  A urinary tract infection (UTI).  Strep throat.  Meningitis. This affects the spinal cord and brain.  A bad lung infection. You may start the medicines while your doctor waits for tests to come back. When the tests come back, your doctor may change or stop your medicine. When are antibiotics not needed? You do not need these medicines for most common illnesses, such as:  A cold.  The flu.  A sore throat. Antibiotics are not always needed for all infections caused by bacteria. Do not ask for these medicines, or take them, when they are not needed. What are the risks of taking antibiotics? Most antibiotics can cause an infection called Clostridioides difficile (C. diff). This causes watery poop (diarrhea). Let your doctor know right away if:  You have watery poop while taking an antibiotic.  You have watery poop after you stop taking an antibiotic. The illness can happen weeks after you stop the medicine. You also have a risk of getting an infection in the future that antibiotics cannot treat (antibiotic-resistant infection). This type of infection can be dangerous. What else should I know about taking antibiotics?   You need to take the entire prescription. ? Take the medicine for as long as told by your doctor. ? Do not stop taking it even if you start to feel better.  Try not to miss any doses. If you miss a dose, call your doctor.  Birth control pills may not work. If you take birth control pills: ? Keep on taking them. ? Use a second form of birth control, such as a condom. Do this for as long as told by your doctor.  Ask your doctor: ? How long to wait in between doses. ? If you should take the  medicine with food. ? If there is anything you should stay away from while taking the antibiotic, such as: ? Food. ? Drinks. ? Medicines. ? If there are any side effects you should watch for.  Only take the medicines that your doctor told you to take. Do not take medicines that were given to someone else.  Drink a large glass of water with the medicine.  Ask the pharmacist for a tool to measure the medicine, such as: ? A syringe. ? A cup. ? A spoon.  Throw away any extra medicine. Contact a doctor if:  You get worse.  You have new joint pain or muscle aches after starting the medicine.  You have side effects from the medicine, such as: ? Stomach pain. ? Watery poop. ? Feeling sick to your stomach (nausea). Get help right away if:  You have signs of a very bad allergic reaction. If this happens, stop taking the medicine right away. Signs may include: ? Hives. These are raised, itchy, red bumps on the skin. ? Skin rash. ? Trouble breathing. ? Wheezing. ? Swelling. ? Feeling dizzy. ? Throwing up (vomiting).  Your pee (urine) is dark, or is the color of blood.  Your skin turns yellow.  You bruise easily.  You bleed easily.  You have very bad watery poop and cramps in your belly.  You have a very bad headache. Summary  Antibiotics are often used to treat infections caused by bacteria.  take these medicines when needed. °· Let your doctor know if you have watery poop while taking an antibiotic. °· You need to take the entire prescription. °This information is not intended to replace advice given to you by your health care provider. Make sure you discuss any questions you have with your health care provider. °Document Released: 08/20/2008 Document Revised: 12/18/2018 Document Reviewed: 11/13/2016 °Elsevier Patient Education © 2020 Elsevier Inc. ° °

## 2019-10-04 NOTE — TOC Transition Note (Signed)
Transition of Care [facility]) - CM/SW Discharge Note   Patient Details  Name: Keith Pittman MRN: [id number] Date of Birth: 12-17-1931  [facility] CM/SW Contact:  Alexander Mt, [place] Phone Number: 10/04/2019, 8:36 AM   Clinical Narrative:    Pt bed available at [facility], secure messaged MD for dc order and summary. Await paperwork for placement discharge.    Final next level of care: Skilled Nursing Facility Barriers to Discharge: SNF Pending discharge orders, SNF Pending discharge summary   Patient Goals and CMS Choice Patient states their goals for this hospitalization and ongoing recovery are:: to go home and be by myself again CMS Medicare.gov Compare Post Acute Care list provided to:: Patient Represenative (must comment)(pt sister [name]) Choice offered to / list presented to : Patient, Sibling  Discharge Placement              Patient chooses bed at: [facility] Patient to be transferred to facility by: [place] Name of family member notified: pt sister [name] Patient and family notified of of transfer: 10/04/19  Discharge Plan and Services In-house Referral: Clinical Social Work Discharge Planning Services: CM Consult Post Acute Care Choice: [place]                               Social Determinants of Health (SDOH) Interventions     Readmission Risk Interventions Readmission Risk Prevention Plan 09/30/2019  Post Dischage Appt Not Complete  Appt Comments plan for SNF  Medication Screening Complete  Transportation Screening Complete  Some recent data might be hidden

## 2019-10-04 NOTE — Progress Notes (Signed)
Physical Therapy Treatment Patient Details Name: Keith Pittman MRN: [id number] DOB: 05-01-1932 Today's Date: 10/04/2019    History of Present Illness Pt is an 85 yo male presenting following a fall at home where the pt was discovered by his sister, and brought to the hospital where he was found to have AMS and UTI. Per pt sister, he is disoriented at baseline, with PMH of frequent falls, SDH min march 2020, DM II, and colon cancer.    PT Comments    Pt in bed upon PT arrival, willing to participate in PT. Pt was able to demonstrate good bed mobility with improved safety awareness for transfer (pt identified danger of pulling up solely on the RW). Pt was also able to demo improved amb distance in hall, but demos poor gait patters, poor positioning relative to the RW, and poor maintenance of corrections provided by PT. Pt demos several instances of impulsive movements that indicate continued poor safety awareness. Pt will continue to benefit from skilled PT to address functional mobility and balance.    Follow Up Recommendations  SNF;Supervision/Assistance - 24 hour     Equipment Recommendations  Rolling walker with 5" wheels    Recommendations for Other Services       Precautions / Restrictions Precautions Precautions: Fall Precaution Comments: Per pt sister, he falls frequently at home (pt denies this). Pt STM is limited, making him a higher fall risk Restrictions Weight Bearing Restrictions: No    Mobility  Bed Mobility Overal bed mobility: Needs Assistance Bed Mobility: Supine to Sit     Supine to sit: HOB elevated;Min guard     General bed mobility comments: VCs for sequencing, pt able to complete with min guard with VCs for pt to fully rotate trunk to sitting EOB  Transfers Overall transfer level: Needs assistance Equipment used: Rolling walker (2 wheeled) Transfers: Sit to/from Stand Sit to Stand: Min guard         General transfer comment: Pt voiced concern  about pulling to stand using the RW, was then educated on pushing from bed for safety.  Ambulation/Gait Ambulation/Gait assistance: Min guard Gait Distance (Feet): 150 Feet Assistive device: Rolling walker (2 wheeled) Gait Pattern/deviations: Step-through pattern;Decreased dorsiflexion - right;Decreased dorsiflexion - left;Trunk flexed Gait velocity: 0.64m/s Gait velocity interpretation: <1.8 ft/sec, indicate of risk for recurrent falls General Gait Details: Pt ambulates with min guard due to multiple instances of impulsive movements during ambulation (picking up walker and moving laterally when in open hallway with no obstacles). Pt ambulates with a gait speed of 0.78m/s with sig forward flexed posture and cannot maintain upright with close positioning to the RW without frequent VCs.   Stairs             Wheelchair Mobility    Modified Rankin (Stroke Patients Only)       Balance Overall balance assessment: Needs assistance;History of Falls Sitting-balance support: No upper extremity supported;Feet supported Sitting balance-Leahy Scale: Good     Standing balance support: Bilateral upper extremity supported;During functional activity Standing balance-Leahy Scale: Fair Standing balance comment: Pt can stand without AD, but demos poor balance and reactions                            Cognition Arousal/Alertness: Awake/alert Behavior During Therapy: Restless Overall Cognitive Status: Impaired/Different from baseline Area of Impairment: Attention;Memory;Safety/judgement;Problem solving  Current Attention Level: Sustained Memory: Decreased short-term memory Following Commands: Follows multi-step commands with increased time;Follows one step commands with increased time Safety/Judgement: Decreased awareness of safety   Problem Solving: Slow processing;Decreased initiation;Difficulty sequencing;Requires verbal cues General Comments: Pt with  improved cooperation and ability to follow commands today during the session, improved probelm solving, but still benefits from some VCs for sequencing. Pt was restless through session, was not able to maintain corrections for more than a few seconds at a time. Impulsive      Exercises      General Comments        Pertinent Vitals/Pain Pain Assessment: No/denies pain    Home Living                      Prior Function            PT Goals (current goals can now be found in the care plan section) Acute Rehab PT Goals Patient Stated Goal: return home as soon as possible PT Goal Formulation: With patient Time For Goal Achievement: 10/13/19 Potential to Achieve Goals: Fair Progress towards PT goals: Progressing toward goals    Frequency    Min 2X/week      PT Plan Current plan remains appropriate    Co-evaluation              AM-PAC PT "6 Clicks" Mobility   Outcome Measure  Help needed turning from your back to your side while in a flat bed without using bedrails?: None Help needed moving from lying on your back to sitting on the side of a flat bed without using bedrails?: A Little Help needed moving to and from a bed to a chair (including a wheelchair)?: A Little Help needed standing up from a chair using your arms (e.g., wheelchair or bedside chair)?: A Little Help needed to walk in hospital room?: A Little Help needed climbing 3-5 steps with a railing? : A Lot 6 Click Score: 18    End of Session Equipment Utilized During Treatment: Gait belt Activity Tolerance: Patient tolerated treatment well Patient left: in chair;with chair alarm set;with call bell/phone within reach Nurse Communication: (cognition, poor safety awareness) PT Visit Diagnosis: Unsteadiness on feet (R26.81);Other abnormalities of gait and mobility (R26.89);Repeated falls (R29.6);History of falling (Z91.81)     Time: [id number] PT Time Calculation (min) (ACUTE ONLY): 19  min  Charges:  $Gait Training: 8-22 mins                     Keith Pittman, PT, DPT   Acute Rehabilitation Department [phone]   Keith Pittman 10/04/2019, 1:31 PM

## 2019-10-05 DIAGNOSIS — R2689 Other abnormalities of gait and mobility: Secondary | ICD-10-CM | POA: Diagnosis not present

## 2019-10-05 DIAGNOSIS — S065X9A Traumatic subdural hemorrhage with loss of consciousness of unspecified duration, initial encounter: Secondary | ICD-10-CM | POA: Diagnosis not present

## 2019-10-05 DIAGNOSIS — I872 Venous insufficiency (chronic) (peripheral): Secondary | ICD-10-CM | POA: Diagnosis not present

## 2019-10-05 DIAGNOSIS — F4324 Adjustment disorder with disturbance of conduct: Secondary | ICD-10-CM | POA: Diagnosis not present

## 2019-10-05 DIAGNOSIS — N390 Urinary tract infection, site not specified: Secondary | ICD-10-CM | POA: Diagnosis not present

## 2019-10-07 ENCOUNTER — Other Ambulatory Visit: Payer: Self-pay | Admitting: *Deleted

## 2019-10-07 NOTE — Patient Outreach (Signed)
Member assessed for potential [facility] Care Management needs as a benefit of  [place] Medicare.  Member is currently receiving rehab therapy at [facility].  Member discussed today in weekly telephonic IDT meeting with facility staff, [facility] UM team, and writer.  Facility reports member lived alone prior. Disposition plans pending. Facility care plan meeting scheduled with family on 11/16. Will have a better idea of disposition plans at that time. PACE program was mentioned as a possibility.   Writer will continue to follow for disposition plans, progression, and for potential [facility] Care Management needs.  Marthenia Rolling, MSN-Ed, RN,BSN [place] Acute Care Coordinator [phone] [facility]) [phone]  (Toll free office)

## 2019-10-12 DIAGNOSIS — F4324 Adjustment disorder with disturbance of conduct: Secondary | ICD-10-CM | POA: Diagnosis not present

## 2019-10-12 DIAGNOSIS — N390 Urinary tract infection, site not specified: Secondary | ICD-10-CM | POA: Diagnosis not present

## 2019-10-12 DIAGNOSIS — R2689 Other abnormalities of gait and mobility: Secondary | ICD-10-CM | POA: Diagnosis not present

## 2019-10-12 DIAGNOSIS — S065X9A Traumatic subdural hemorrhage with loss of consciousness of unspecified duration, initial encounter: Secondary | ICD-10-CM | POA: Diagnosis not present

## 2019-10-14 ENCOUNTER — Other Ambulatory Visit: Payer: Self-pay | Admitting: *Deleted

## 2019-10-14 NOTE — Patient Outreach (Signed)
Member assessed for potential [facility] Care Management needs as a benefit of  [place] Medicare.  Member is currently receiving skilled therapy at [facility].  Member discussed in weekly telephonic IDT meeting with facility staff, [facility] UM team, and writer.  Facility reports member is adamant about returning home alone.  Facility reports member declines PACE referral.  Facility likely to contact APS if member returns home.   Will plan outreach to member's sister and/or sister to discuss disposition plans and potential [facility] Care Management needs.  Marthenia Rolling, MSN-Ed, RN,BSN [place] Acute Care Coordinator [phone] [facility]) [phone]  (Toll free office)

## 2019-10-18 ENCOUNTER — Other Ambulatory Visit: Payer: Self-pay | Admitting: *Deleted

## 2019-10-18 NOTE — Patient Outreach (Signed)
Member assessed for potential [facility] Care Management needs as a benefit of [place] Medicare.  Mr. Litherland remains at [facility] receiving skilled therapy.   Facility dc planner confirmed that Mr. Michalak is adamant in returning home. Member's sister [name] is the primary contact person. Facility reports they plan on contacting APS upon SNF discharge.  Telephone call made to Mr. Ardila at [phone]. No answer. Left HIPAA compliant voicemail message to request call back. Telephone call made to Armanda Heritage (sister/HCPOA) at [phone]. Patient identifiers confirmed.  [name] confirms that member is likely to dc home on this Saturday. States he "does not want anyone to help, he doesn't want to go anywhere." [name] states she has made many attempts to get him to agree to [facility] or  ALF. He adamantly refuses both. [name] is in the process of confirming whether Mr. Bergsma is eligible for VA benefits for caregiver services.   [name] endorses that member does not want anyone really to come to his house however.  [name] states she is aware that [facility] will make an APS referral upon SNF discharge due to safety. States her brother is not willing to cooperate with any suggestions or recommendations and is resistant to anyone coming to his home. [name] is having security cameras installed so she can keep a better eye on him. Mr. Blan has a history of frequent falls. .  Discussed potential palliative care referral. [name] states "my brother does not even acknowledge anything is wrong with him." He will not be receptive to palliative care.   Discussed whether she thought Mr. Nishiyama has capacity. States she has questioned his capacity and member was found to be competent to make his own decisions when he was hospitalized. States he has dementia and answers questions appropriately. However, she states, he has does have confusion at times.   [name] is agreeable to Probation officer contacting her next week to  discuss [place] Management follow up. States she does not think her brother will participate with the phone calls as he usually does not want to be bothered.   Will follow up next week for potential [facility] Care Management services.    Marthenia Rolling, MSN-Ed, RN,BSN [place] Acute Care Coordinator [phone] [facility]) [phone]  (Toll free office)

## 2019-10-26 ENCOUNTER — Other Ambulatory Visit: Payer: Self-pay | Admitting: *Deleted

## 2019-10-26 NOTE — Patient Outreach (Signed)
Member assessed for potential [facility] Care Management needs as a benefit of Keith Pittman Medicare.  Keith Pittman was slated for dc from [facility] over the weekend.   Telephone call made to Keith Pittman at [phone]. His sister/HCPOA Keith Pittman answered the phone. She states she is on the way to an appointment and would like writer to call back later. Discussed that Probation officer was calling to speak with Keith Pittman. Keith Pittman confirms Keith Pittman is home from SNF. However, she states it is not a good time to speak with him.  Discussed that writer will make referral to [place] Management team as previously discussed.   Keith Rolling, MSN-Ed, RN,BSN [place] Acute Care Coordinator [phone] [facility]) [phone]  (Toll free office)

## 2019-10-28 ENCOUNTER — Other Ambulatory Visit: Payer: Self-pay | Admitting: *Deleted

## 2019-10-28 NOTE — Patient Outreach (Signed)
[facility]) Care Management  10/28/2019  SALESI PAINTON 19-Jan-1932 [id number]   RN spoke with pt's sister [name] [facility]) today and introduced [facility] services and purpose for today's call. Caregiver sister indicated not a good time to talk but requested a call back tomorrow after 1pm.  PLAN: RN scheduled another follow up call for tomorrow and will further discuss possible needs at that time.  Raina Mina, RN Care Management Coordinator [place] Office [phone]

## 2019-10-29 ENCOUNTER — Other Ambulatory Visit: Payer: Self-pay | Admitting: *Deleted

## 2019-10-29 NOTE — Patient Outreach (Signed)
[facility]) Care Management  10/29/2019  Keith Pittman 05/02/1932 [id number]    Transition of Care -Unsuccessful  RN attempted outreach call today however unsuccessful. RN able to leave a HIPAA approved voice message requesting a call back.   PLAN: RN will attempt another outreach call next week and further engage at that time on pt's possible needs.   Raina Mina, RN Care Management Coordinator [place] Office [phone]

## 2019-11-02 ENCOUNTER — Other Ambulatory Visit: Payer: Self-pay | Admitting: *Deleted

## 2019-11-02 NOTE — Patient Outreach (Signed)
[place] [facility]) Care Management  11/02/2019  KIERNAN BARTLETTE 1932/01/11 [id number]   Telephone Assessment-Unsuccessful 3rd outreach  RN attempted another outreach call however remains unsuccessful. RN able to leave a HIPAA approved voice message requesting a call back.  Plan: Will follow up next week with another outreach call for pending services with [facility].  Raina Mina, RN Care Management Coordinator [place] Office [phone]

## 2019-11-09 ENCOUNTER — Other Ambulatory Visit: Payer: Self-pay | Admitting: *Deleted

## 2019-11-09 DIAGNOSIS — R4182 Altered mental status, unspecified: Secondary | ICD-10-CM

## 2019-11-09 NOTE — Patient Outreach (Signed)
[place] [facility]) Care Management  11/09/2019  Keith Pittman May 22, 1932 [id number]  Transition of Care (3rd outreach)  RN attempted outreach call however unsuccessful. RN able to leave a HIPAA approved voice message requesting a call back.   Plan: Will further intervene with the pending call back.   Raina Mina, RN Care Management Coordinator [place] Office [phone]

## 2019-11-10 ENCOUNTER — Other Ambulatory Visit: Payer: Self-pay | Admitting: *Deleted

## 2019-11-10 NOTE — Patient Outreach (Signed)
[place] [facility]) Care Management  11/10/2019  Keith Pittman Jun 19, 1932 [id number]    Telephone Assessment  It has taken several outreach calls to reach this caregiver for the pt. RN was able to speak more in detail concerning pt's issues. States pt has declined several agencies for services with verbal abuse that has revoked services. Sister has indicated that the provider and the pt's brother along with other family members have tried to address pt's issues and convenience pt to allow the needed assistance but pt again adamantly refused. RN offered to assist in anyway however sister has indicated pt would refused and it may not be in a nice or respectful way.  Sister states she continue to try and assist pt with cameras installed recent and found pt on the floor one day where she and her daughter went to assist pt and again attempted to speak with pt about having PT in the home (pt refused).  Also states pt is refuse to groom himself and has refused a Administrator that is willing to come to his home for services. Sister feels pt does not wish to accept he is "getting older" and unable to manage his care independently. Pt verbally abusive however sister quick to tell pt she is the only one willing to help him at this time then pt becomes more receptive however only to his sister Keith Pittman).   Based upon this conversation and pt's verbal abuse to other agencies, decline from family via [facility] services RN has provided contact number if pt becomes more receptive or will to accept outside agencies to assist she may call for any needed resources. Note pt has supportive family however refuses all available services at this time.  PLAN: Will update provider that pt has adamantly decline [facility] services at this time. Case closure.  Raina Mina, RN Care Management Coordinator [place] Office [phone]

## 2020-09-07 IMAGING — CT CT HEAD WITHOUT CONTRAST
3 of 4 series · 13 of 47 positions shown, 15 images · non-contrast
Comparison: CT HEAD February 01, 2019

CLINICAL DATA: Follow up subdural hematoma.

EXAM:
CT HEAD WITHOUT CONTRAST
TECHNIQUE: Contiguous axial images were obtained from the base of the skull
through the vertex without intravenous contrast.

[Series 3: head wo · axial · 0.46mm/px · z∈[-144,+0]mm · 7 of 39 slices shown, 9 images]
[im 5/39  brain]
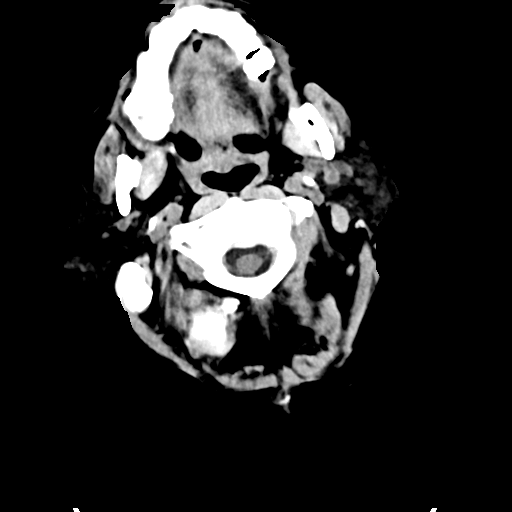
[im 5/39  bone]
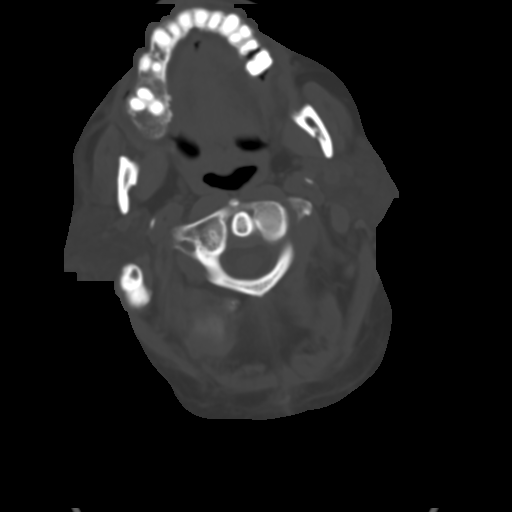
[im 10/39  brain]
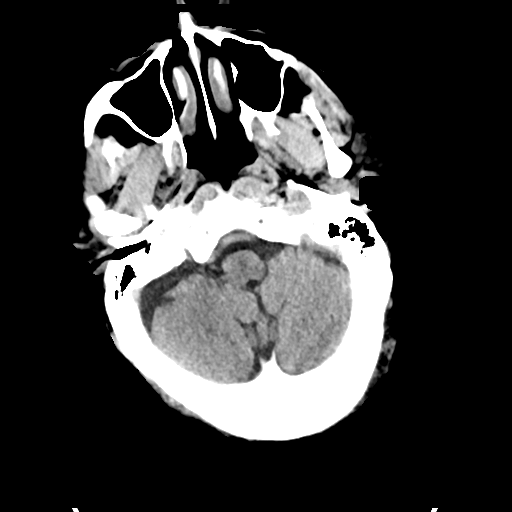
[im 15/39  brain]
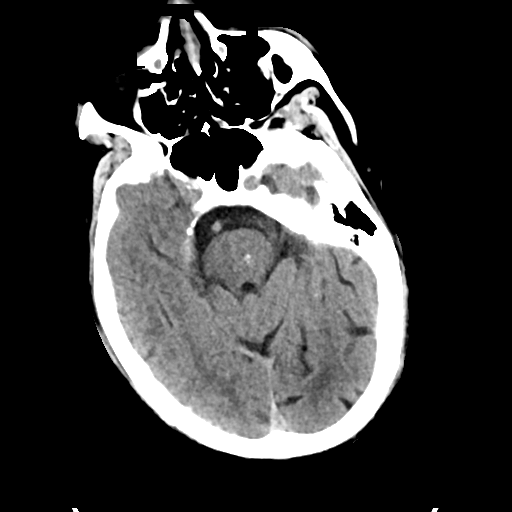
[im 20/39  brain]
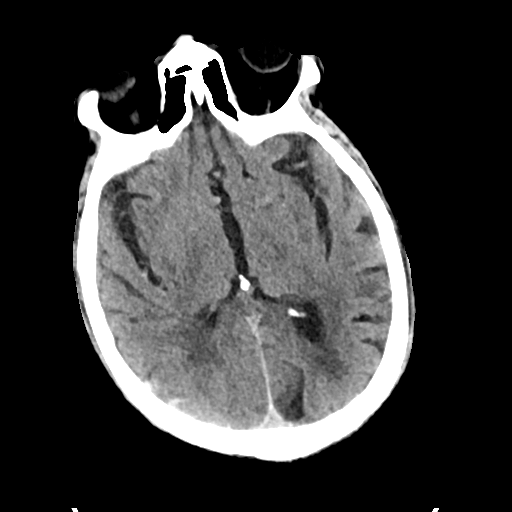
[im 24/39  brain]
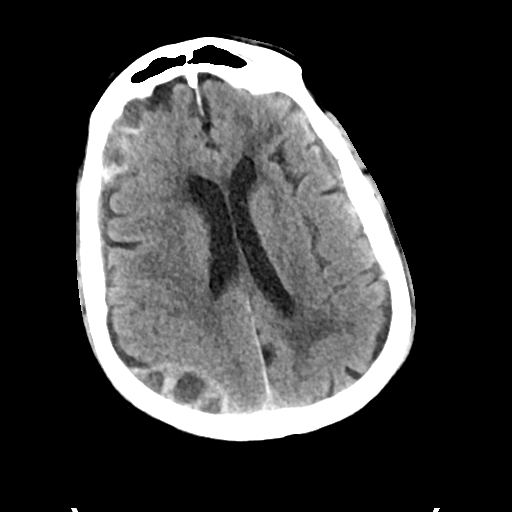
[im 24/39  bone]
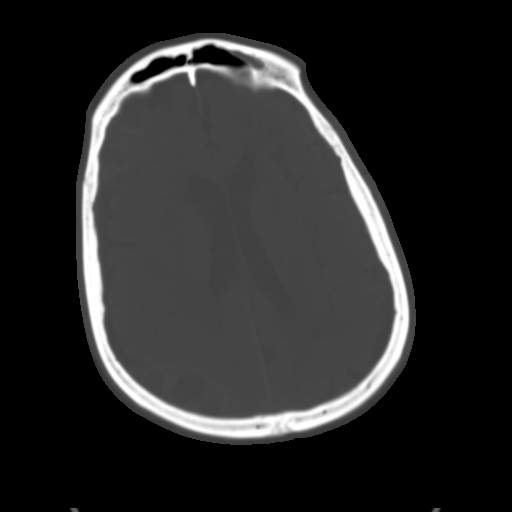
[im 29/39  brain]
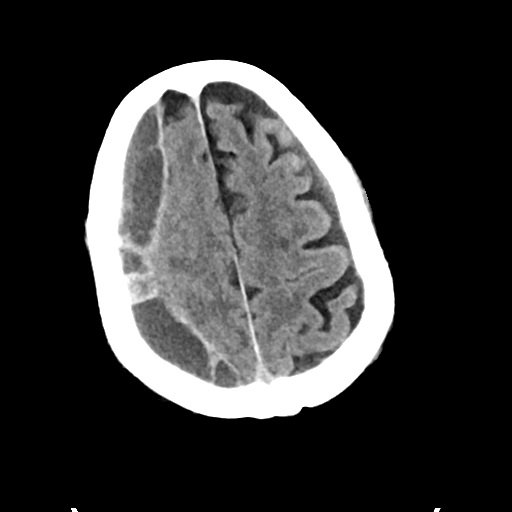
[im 34/39  brain]
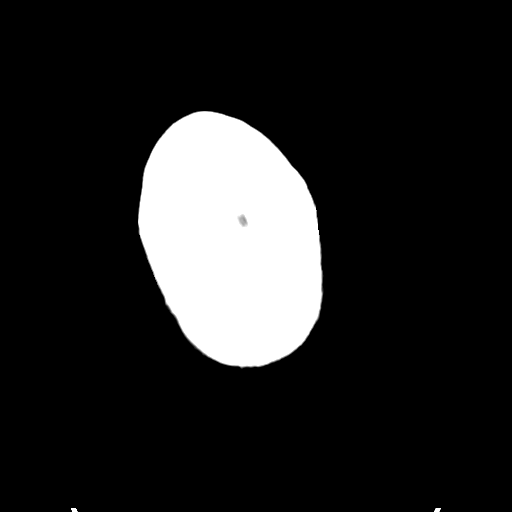

[Series 5: cor soft · coronal · 0.37mm/px · 3 of 70 slices shown]
[im 24/70  brain]
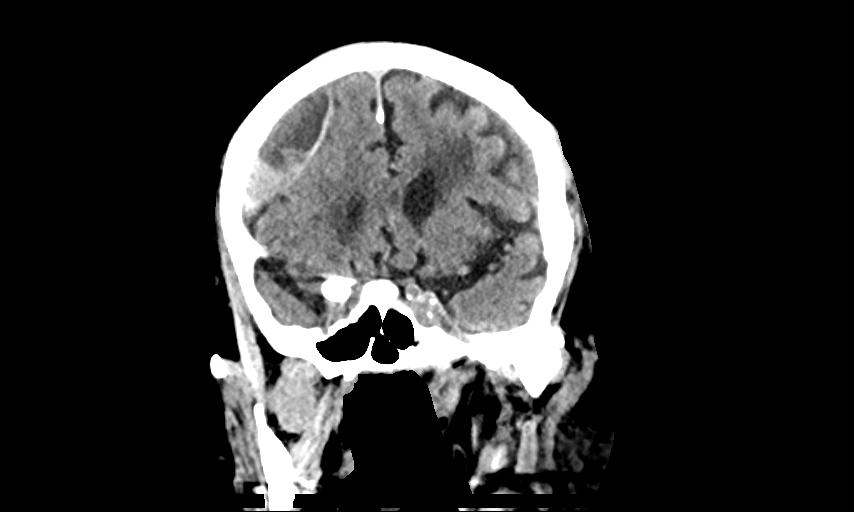
[im 31/70  brain]
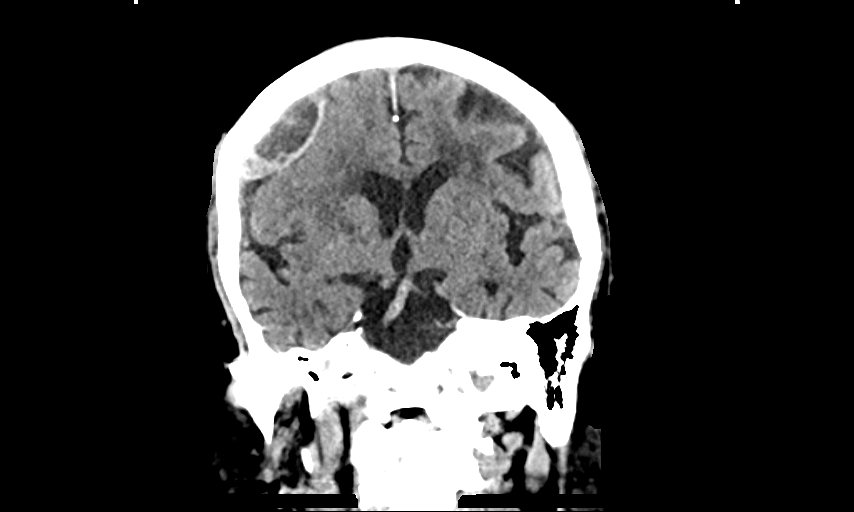
[im 39/70  brain]
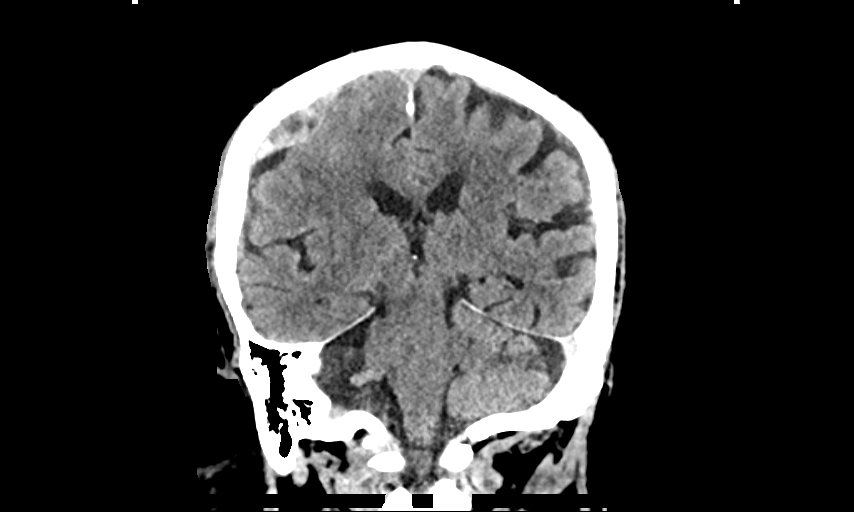

[Series 6: sag soft · sagittal · 0.40mm/px · 3 of 67 slices shown]
[im 23/67  brain]
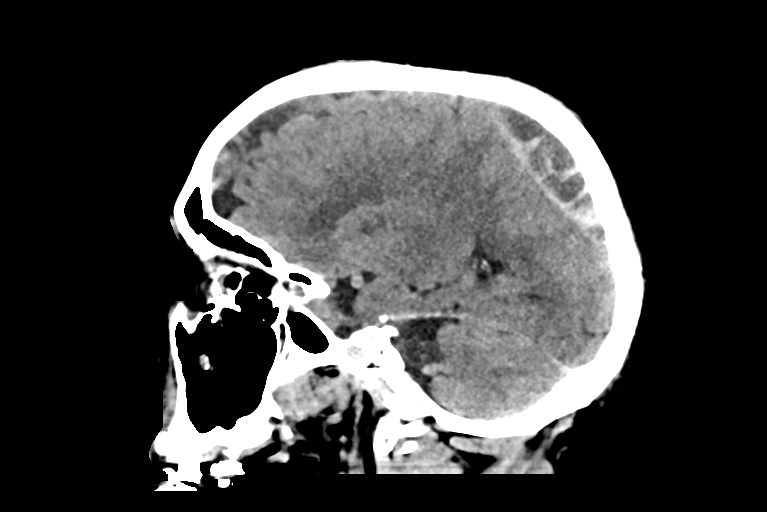
[im 34/67  brain]
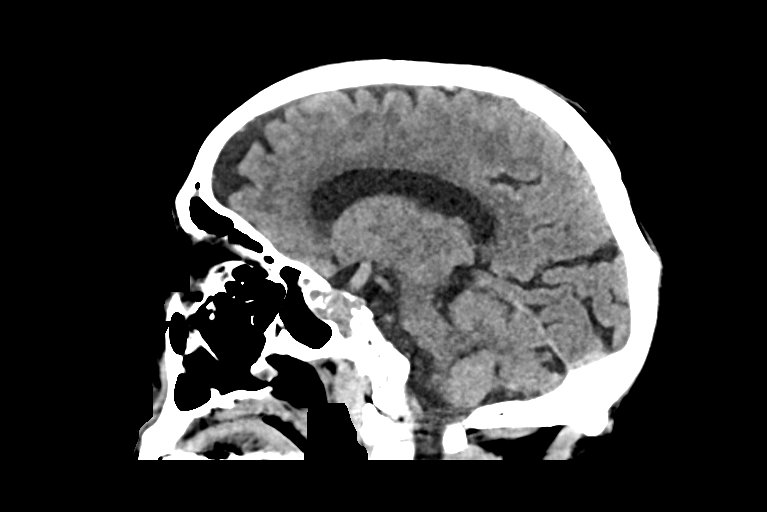
[im 45/67  brain]
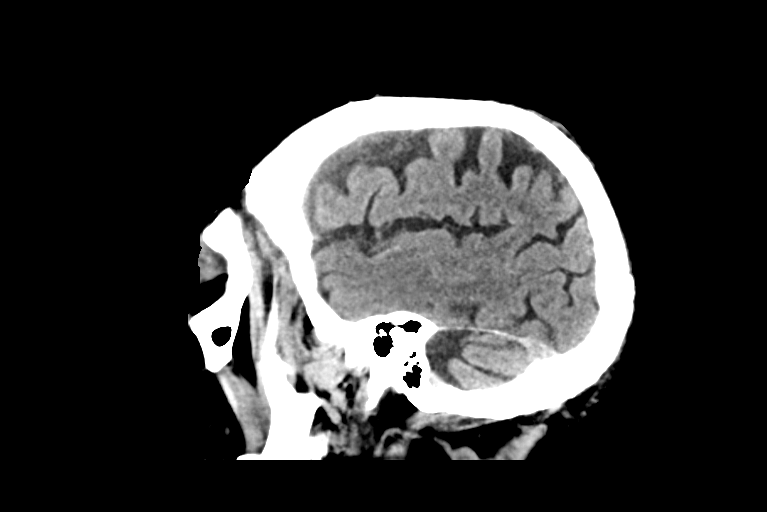

[13 of 47 positions shown; findings below may reference images not displayed]

FINDINGS: BRAIN: Increased size of RIGHT holo hemispheric mixed density
subdural hematoma, increased blood products from prior CT. Dominant
component measures 22 mm, previously 20. 2 mm RIGHT to LEFT midline
shift. LEFT holo hemispheric subdural hematoma measuring to 6 mm has
increased. No hydrocephalus. No intraparenchymal hemorrhage. No
acute large vascular territory infarcts. Patchy to confluent
supratentorial white matter hypodensities. Pontine calcifications
associated with vascular lesions. Basal cisterns are patent.

VASCULAR: Moderate calcific atherosclerosis of the carotid siphons.
Bulbous appearance of LEFT carotid canal seen with tortuosity or
aneurysm, unchanged.

SKULL: No skull fracture. No significant scalp soft tissue swelling.

SINUSES/ORBITS: Mild lobulated paranasal sinus mucosal thickening.
Mastoid air cells are well aerated.The included ocular globes and
orbital contents are non-suspicious.

OTHER: None.
IMPRESSION: 1. Increased size of mixed density RIGHT holo hemispheric subdural
hematoma with membrane formation. Increased 6 mm LEFT mixed density
subdural hematoma. No midline shift.
2. Moderate to severe chronic small vessel ischemic changes.

## 2020-09-09 IMAGING — CT CT HEAD WITHOUT CONTRAST
4 series · 15 of 47 positions shown, 17 images · non-contrast
Comparison: 02/02/2019 CT head.

CLINICAL DATA: 86 y/o  M; follow-up of subdural hematoma.

EXAM:
CT HEAD WITHOUT CONTRAST
TECHNIQUE: Contiguous axial images were obtained from the base of the skull
through the vertex without intravenous contrast.

[Series 3: head wo · axial · 0.46mm/px · z∈[-117,+3]mm · 7 of 34 slices shown, 9 images]
[im 5/34  brain]
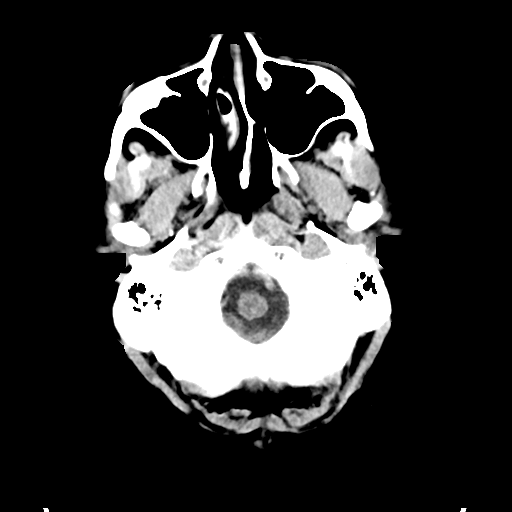
[im 5/34  bone]
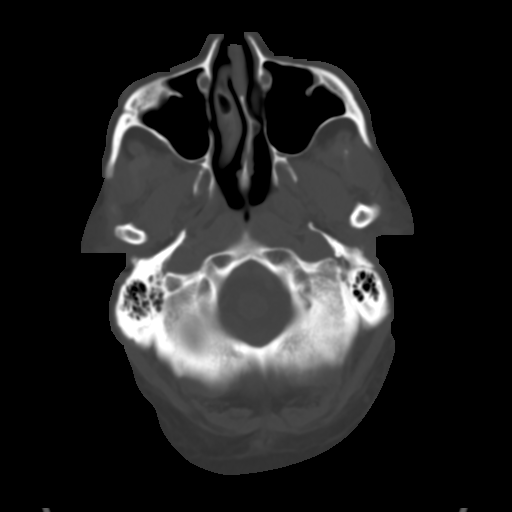
[im 9/34  brain]
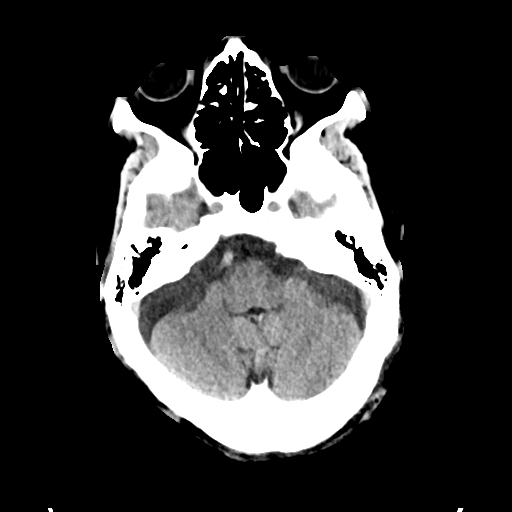
[im 13/34  brain]
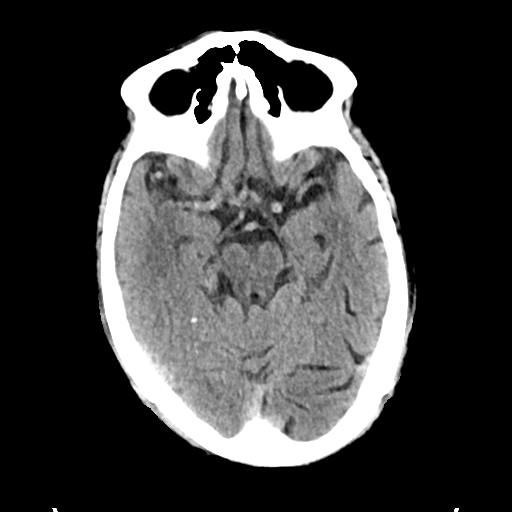
[im 17/34  brain]
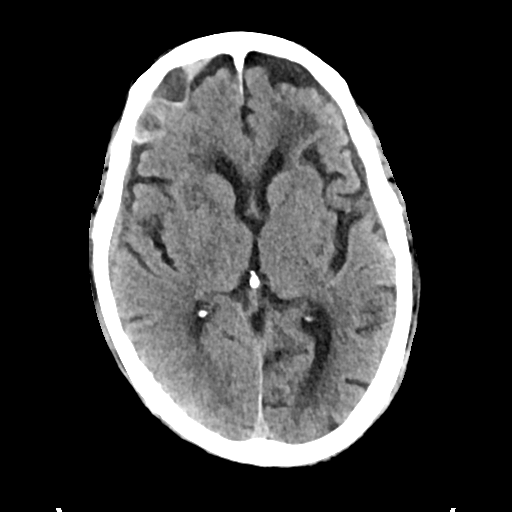
[im 21/34  brain]
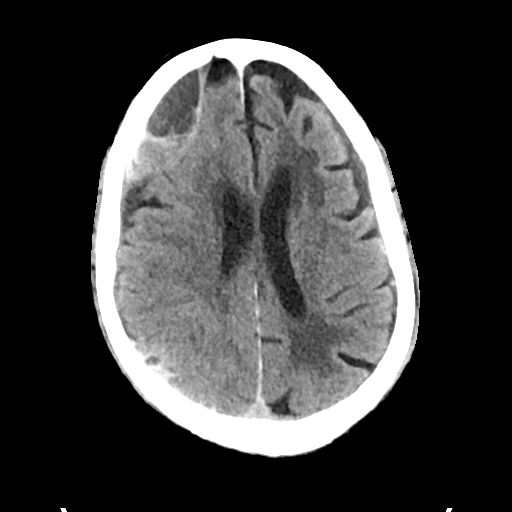
[im 21/34  bone]
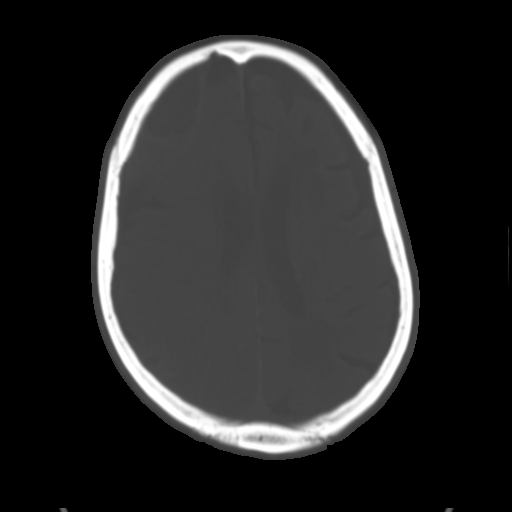
[im 25/34  brain]
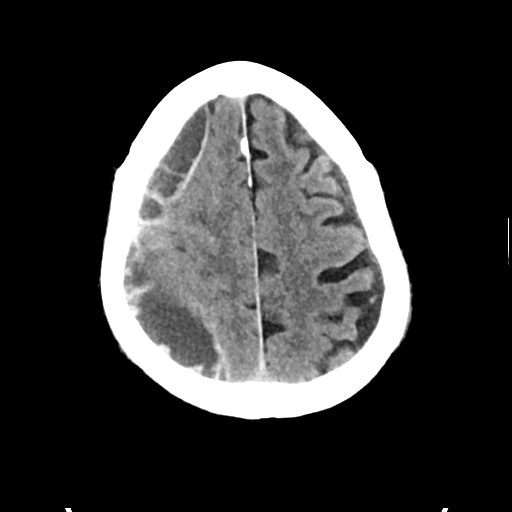
[im 29/34  brain]
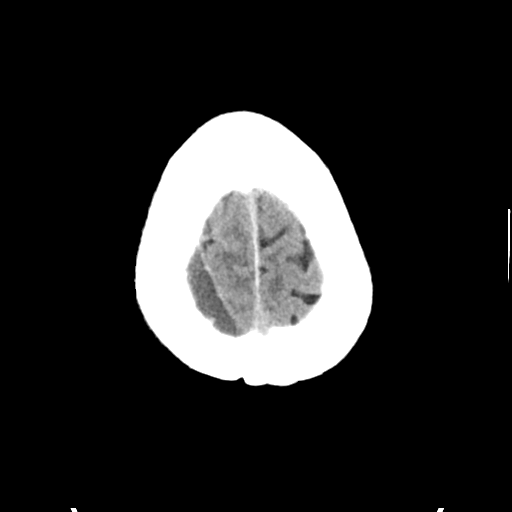

[Series 4: head bone · axial · 0.46mm/px · z∈[-121,-105]mm · 2 of 85 slices shown]
[im 9/85  bone]
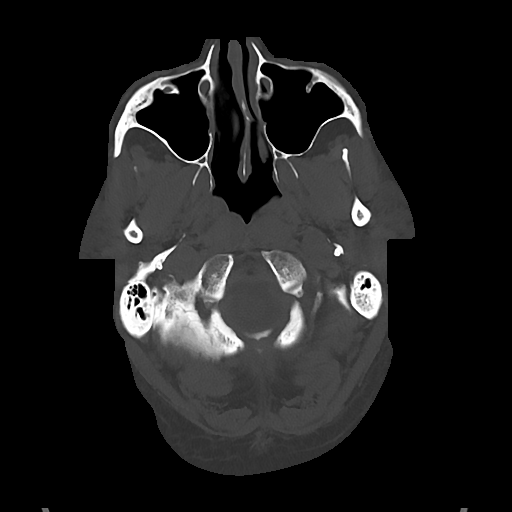
[im 17/85  bone]
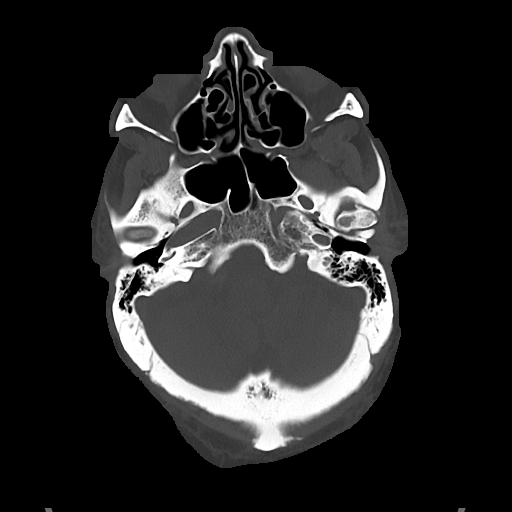

[Series 5: cor soft · coronal · 0.33mm/px · 3 of 72 slices shown]
[im 24/72  brain]
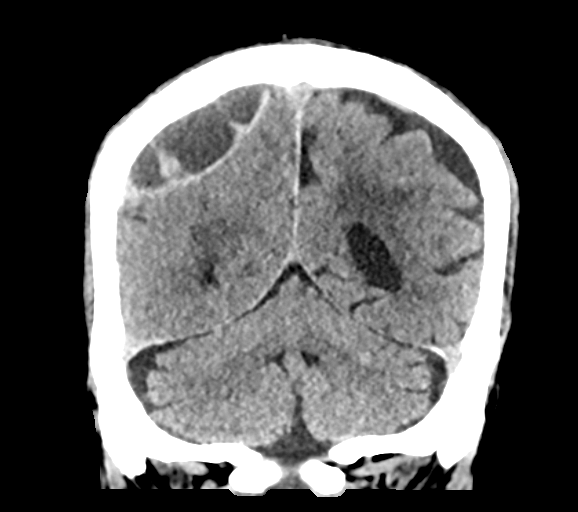
[im 32/72  brain]
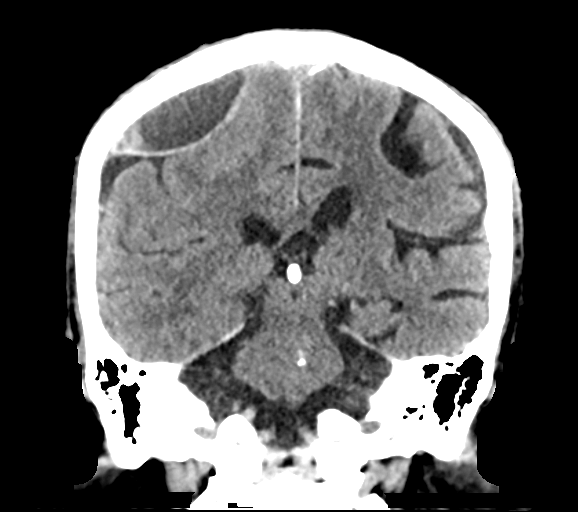
[im 40/72  brain]
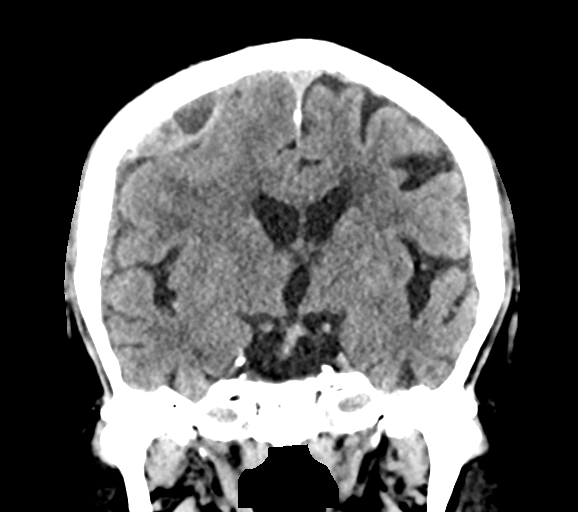

[Series 6: sag soft · sagittal · 0.33mm/px · 3 of 62 slices shown]
[im 21/62  brain]
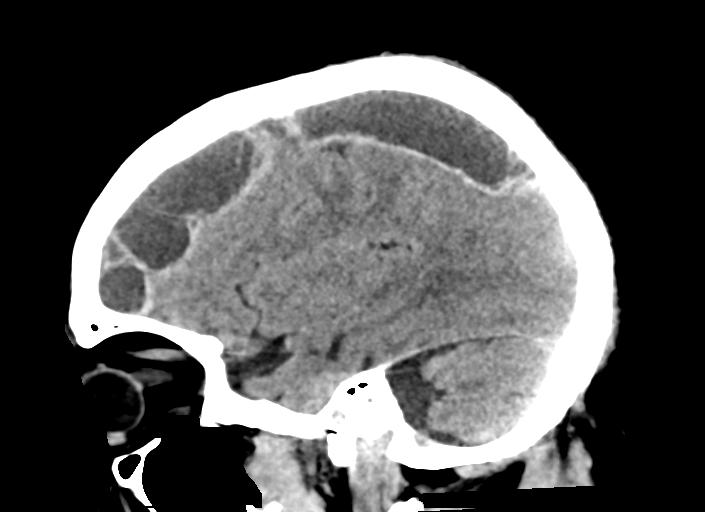
[im 31/62  brain]
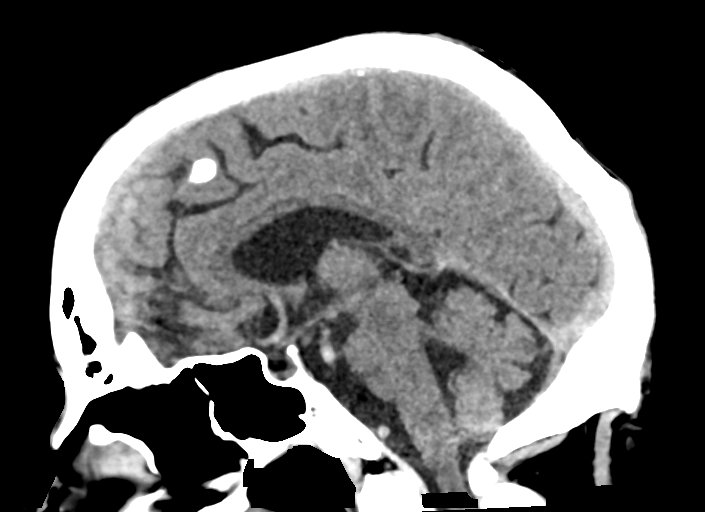
[im 41/62  brain]
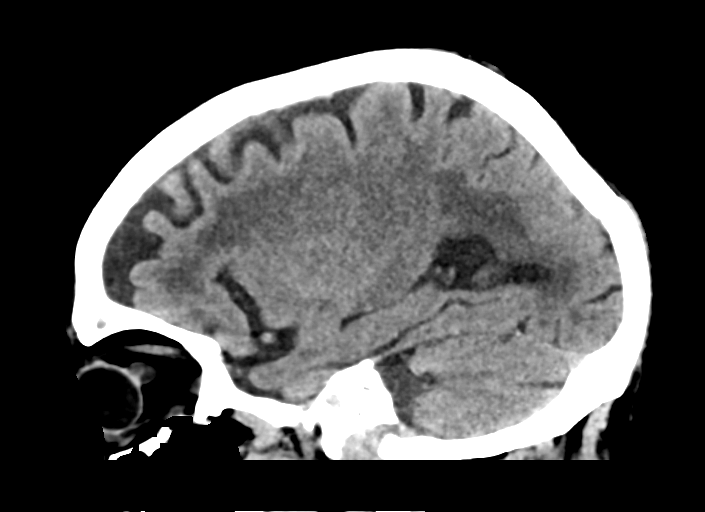

[15 of 47 positions shown; findings below may reference images not displayed]

FINDINGS: Brain: Stable extensive subdural hematoma over the right cerebral
convexity with membrane formation measuring up to 21 mm over the
frontal lobe. Stable low-attenuation subdural hematoma over the left
cerebral convexity measuring up to 6 mm over the left frontal lobe.
Stable associated mass effect with minimal 1-2 mm right-to-left
midline shift. No new acute intracranial hemorrhage, extra-axial
collection, hydrocephalus, or herniation. Nonspecific white matter
hypodensities are stable and compatible with chronic microvascular
ischemic changes. Stable volume loss of the brain. Stable
nonspecific calcification within the left hemi pons.

Vascular: Calcific atherosclerosis of the carotid siphons. No
hyperdense vessel identified.

Skull: Normal. Negative for fracture or focal lesion.

Sinuses/Orbits: No acute finding. Small left maxillary sinus mucous
retention cyst.

Other: None.
IMPRESSION: 1. Stable right larger than left subdural hematoma over the cerebral
convexities. Stable associated mass effect with minimal
right-to-left midline shift.
2. Stable moderate to severe chronic microvascular ischemic changes
and volume loss of the brain.
3. No new acute intracranial abnormality identified.

## 2022-04-02 ENCOUNTER — Other Ambulatory Visit: Payer: Self-pay

## 2022-04-02 ENCOUNTER — Emergency Department (HOSPITAL_BASED_OUTPATIENT_CLINIC_OR_DEPARTMENT_OTHER)
Admission: EM | Admit: 2022-04-02 | Discharge: 2022-04-02 | Disposition: A | Discharge status: Home / Self Care | Payer: Medicare Other | Attending: Emergency Medicine | Admitting: Emergency Medicine

## 2022-04-02 ENCOUNTER — Encounter (HOSPITAL_BASED_OUTPATIENT_CLINIC_OR_DEPARTMENT_OTHER): Payer: Self-pay

## 2022-04-02 DIAGNOSIS — R634 Abnormal weight loss: Secondary | ICD-10-CM | POA: Diagnosis not present

## 2022-04-02 DIAGNOSIS — Z6828 Body mass index (BMI) 28.0-28.9, adult: Secondary | ICD-10-CM | POA: Insufficient documentation

## 2022-04-02 DIAGNOSIS — R413 Other amnesia: Secondary | ICD-10-CM | POA: Diagnosis present

## 2022-04-02 DIAGNOSIS — R531 Weakness: Secondary | ICD-10-CM

## 2022-04-02 DIAGNOSIS — R627 Adult failure to thrive: Secondary | ICD-10-CM | POA: Diagnosis present

## 2022-04-02 DIAGNOSIS — R7309 Other abnormal glucose: Secondary | ICD-10-CM | POA: Diagnosis not present

## 2022-04-02 LAB — CBC
HCT: 42.2 % (ref 39.0–52.0)
Hemoglobin: 13.8 g/dL (ref 13.0–17.0)
MCH: 30.1 pg (ref 26.0–34.0)
MCHC: 32.7 g/dL (ref 30.0–36.0)
MCV: 92.1 fL (ref 80.0–100.0)
Platelets: 200 10*3/uL (ref 150–400)
RBC: 4.58 MIL/uL (ref 4.22–5.81)
RDW: 13.5 % (ref 11.5–15.5)
WBC: 4.8 10*3/uL (ref 4.0–10.5)
nRBC: 0 % (ref 0.0–0.2)

## 2022-04-02 LAB — COMPREHENSIVE METABOLIC PANEL
ALT: <5 U/L (ref 0–44)
AST: 14 U/L — ABNORMAL LOW (ref 15–41)
Albumin: 4.3 g/dL (ref 3.5–5.0)
Alkaline Phosphatase: 70 U/L (ref 38–126)
Anion gap: 9 (ref 5–15)
BUN: 16 mg/dL (ref 8–23)
CO2: 26 mmol/L (ref 22–32)
Calcium: 11.2 mg/dL — ABNORMAL HIGH (ref 8.9–10.3)
Chloride: 104 mmol/L (ref 98–111)
Creatinine, Ser: 0.95 mg/dL (ref 0.61–1.24)
GFR, Estimated: >60 mL/min (ref >60)
Glucose, Bld: 110 mg/dL — ABNORMAL HIGH (ref 70–99)
Potassium: 4.4 mmol/L (ref 3.5–5.1)
Sodium: 139 mmol/L (ref 135–145)
Total Bilirubin: 0.5 mg/dL (ref 0.3–1.2)
Total Protein: 7.7 g/dL (ref 6.5–8.1)

## 2022-04-02 LAB — CBG MONITORING, ED: Glucose-Capillary: 109 mg/dL — ABNORMAL HIGH (ref 70–99)

## 2022-04-02 LAB — TSH: TSH: 7.555 u[IU]/mL — ABNORMAL HIGH (ref 0.350–4.500)

## 2022-04-02 NOTE — ED Provider Notes (Signed)
?[place] EMERGENCY DEPT ?Provider Note ? ? ?CSN: [id number] ?Arrival date & time: 04/02/22  1314 ? ?  ? ?History ? ?Chief Complaint  ?Patient presents with  ? Multiple Complaints  ? ? ?Keith Pittman is a 88 y.o. male. ? ?Patient is an 88 year old male with a history of falls several years ago, mild memory problems who still is currently living independently and being brought in by his sister today due to concern for his wellbeing.  They called his PCP Dr. Laurann Montana but he has not been seen in over 2 years and they recommended he come here for further evaluation.  Patient sister reports that she is concerned about the patient because he seems to be losing weight, eating very little, never drinks enough water and stays in bed most of the time.  He currently gets around with a walker but she reports he only gets up to eat and then goes right back to bed.  Patient reports that he is fine and there is nothing wrong.  He reports that he feels fine but again is not taking care of his ADLs the way he once did.  His sister and another family member bringing him meals daily and make sure his laundry is done.  She is his POA and takes care of his finances as well.  They are just concerned that he continues to get worse and feel that over the last few weeks his urine has been very strong smelling and he has seemed more withdrawn.  Approximately 2 years ago when he had falls he spent some time at rehab and then when returned home had home health that came out to try and evaluate him but he was not willing to let them in.  Since that time he has not had any help.  Family is also concerned because he has not had his nails trimmed in several years and is not bathing regularly.  Patient denies any shortness of breath, chest pain, abdominal pain.  He denies feeling depressed.  He reports he feels hungry sometimes but denies any pain with eating. ? ?The history is provided by the patient and a relative.  ? ?  ? ?Home  Medications ?Prior to Admission medications   ?Medication Sig Start Date End Date Taking? Authorizing Provider  ?divalproex (DEPAKOTE) 250 MG DR tablet Take 1 tablet (250 mg total) by mouth at bedtime. ?Patient not taking: Reported on 04/02/2022 10/04/19   Cristal Deer, MD  ?   ? ?Allergies    ?Sulfa antibiotics   ? ?Review of Systems   ?Review of Systems ? ?Physical Exam ?Updated Vital Signs ?BP (!) 149/80   Pulse 61   Temp 99 ?F (37.2 ?C)   Resp 17   Ht '5\' 11"'$  (1.803 m)   Wt 94.1 kg   SpO2 98%   BMI 28.93 kg/m?  ?Physical Exam ?Vitals and nursing note reviewed.  ?Constitutional:   ?   General: He is not in acute distress. ?   Appearance: He is well-developed.  ?   Comments: Disheveled with unkempt hair and beard  ?HENT:  ?   Head: Normocephalic and atraumatic.  ?Eyes:  ?   Conjunctiva/sclera: Conjunctivae normal.  ?   Pupils: Pupils are equal, round, and reactive to light.  ?Cardiovascular:  ?   Rate and Rhythm: Normal rate and regular rhythm.  ?   Heart sounds: No murmur heard. ?Pulmonary:  ?   Effort: Pulmonary effort is normal. No respiratory distress.  ?  Breath sounds: Normal breath sounds. No wheezing or rales.  ?Abdominal:  ?   General: There is no distension.  ?   Palpations: Abdomen is soft.  ?   Tenderness: There is no abdominal tenderness. There is no guarding or rebound.  ?Musculoskeletal:     ?   General: No tenderness. Normal range of motion.  ?   Cervical back: Normal range of motion and neck supple.  ?   Right lower leg: No edema.  ?   Left lower leg: No edema.  ?   Comments: Toenails are overgrown and thickened bilaterally and curling around  ?Skin: ?   General: Skin is warm and dry.  ?   Findings: No erythema or rash.  ?   Comments: Extremely dry skin which is peeling most notably from the knee down bilaterally but also present on the upper extremities.  ?Neurological:  ?   Mental Status: He is alert. Mental status is at baseline.  ?   Comments: Awake and alert but quiet and resistant to  answer many questions.  Patient is oriented to self but was uncertain of the day or month or year  ?Psychiatric:  ?   Comments: Guarded, flat and withdrawn  ? ? ?ED Results / Procedures / Treatments   ?Labs ?(all labs ordered are listed, but only abnormal results are displayed) ?Labs Reviewed  ?COMPREHENSIVE METABOLIC PANEL - Abnormal; Notable for the following components:  ?    Result Value  ? Glucose, Bld 110 (*)   ? Calcium 11.2 (*)   ? AST 14 (*)   ? All other components within normal limits  ?TSH - Abnormal; Notable for the following components:  ? TSH 7.555 (*)   ? All other components within normal limits  ?CBG MONITORING, ED - Abnormal; Notable for the following components:  ? Glucose-Capillary 109 (*)   ? All other components within normal limits  ?CBC  ?URINALYSIS, ROUTINE W REFLEX MICROSCOPIC  ? ? ?EKG ?EKG Interpretation ? ?Date/Time:  Tuesday Apr 02 2022 13:51:38 EDT ?Ventricular Rate:  81 ?PR Interval:  204 ?QRS Duration: 80 ?QT Interval:  336 ?QTC Calculation: 390 ?R Axis:   -12 ?Text Interpretation: Sinus rhythm with frequent Premature ventricular complexes Low voltage QRS Cannot rule out Anterior infarct , age undetermined Abnormal ECG When compared with ECG of 28-Sep-2019 13:50, PREVIOUS ECG IS PRESENT Confirmed by Fredia Sorrow [id number]) on 04/02/2022 2:01:22 PM ? ?Radiology ?No results found. ? ?Procedures ?Procedures  ? ? ?Medications Ordered in ED ?Medications - No data to display ? ?ED Course/ Medical Decision Making/ A&P ?  ?                        ?Medical Decision Making ?Amount and/or Complexity of Data Reviewed ?Independent Historian: guardian and caregiver ?External Data Reviewed: notes. ?Labs: ordered. Decision-making details documented in ED Course. ?ECG/medicine tests: ordered and independent interpretation performed. Decision-making details documented in ED Course. ? ? ?Elderly male being brought in today by family due to concern with his living situation.  Patient is withdrawn,  starting to lose weight and family is concerned that he may be dehydrated.  Also concern for possible urinary tract infection.  Based on patient's story he has been gradually discussed climbing over years.  Her sister is the one who brought him here today as they called their PCP and they recommended him come here to be medically screened.  Patient has no specific complaints and is taking  no medications at this time.  He denies any drug, tobacco or alcohol use.  He has had no recent falls and currently is using a walker.  In the past patient has been unwilling to let home health and but his sister is reporting they need some more help.  Patient has no specific findings on exam except for extremely dry skin in the lower extremities and need of his toenails to be taking care of but will need podiatry.  He has no wounds present.  He has normal vital signs today.  He does not appear to have an acute medical condition going on at this time which would require admission.  Patient is awake and alert but does have evidence of some memory problems.  All this was discussed in length with the patient's sister and other family member who is in the room.  Patient reports he feels fine and he wants to go home.  I have independently interpreted patient's labs today which show normal renal function, blood sugar of 110 nonfasting and the only abnormal finding is a calcium of 11.2.  CBC within normal limits.  UA and TSH are pending.  I independently interpreted patient's EKG today and other than having occasional PVCs there are no other acute changes.  Patient will most likely require vitamin testing by PCP.  Seems that patient would benefit from psychiatric outpatient evaluation as well and home health. ? ?5:11 PM ?Patient is refusing to urinate here.  He would like to take a specimen cup home and will follow-up with the PCP.  Spoke with Mariann Laster with transition of care and home health was ordered.  TSH will need to be followed by PCP  and is pending currently. ? ? ? ? ? ? ? ?Final Clinical Impression(s) / ED Diagnoses ?Final diagnoses:  ?Adult failure to thrive syndrome  ? ? ?Rx / DC Orders ?ED Discharge Orders   ? ?      Ordered  ?  Home Healt

## 2022-04-02 NOTE — ED Notes (Signed)
Attempted to get urine on several occassions using urinal without success.   ?Sister is requesting we send a cup home with her to F/U with PCP.  ED provider made aware.   ?

## 2022-04-02 NOTE — Care Management (Signed)
ED RNCM received a call from EDP at [facility] ED concerning recommendations for [facility] services.  EDP states patient may benefit from [facility] services, as patient may have had [place] in the past. As per the EDP patient's family has reported increased weakness. Attempted to check patient PING no record of [facility] services noted. No preferences stated, contacted [facility] referral was accepted. They will follow up tomorrow with patient and family to arrange a date to start services.  Patient made aware to that they will contact them by phone tomorrow.  ?

## 2022-04-02 NOTE — Discharge Instructions (Addendum)
The labs including the liver test today look normal.  The kidney function is good.  White blood cell and red blood cell counts are normal and blood sugar is good.  It will be very important for you to follow-up with the podiatrist so that your nails can be trimmed so it does not affect your walking.  Home health was ordered and our case manager is sending information to your insurance to have someone come and evaluate you.  Also resources for mental health were provided.  It would be very important for you to follow-up with Dr. Laurann Montana as they can help you navigate this.  Also Dr. Delene Ruffini office would be able to check a urine sample. ?

## 2022-04-02 NOTE — ED Triage Notes (Signed)
Patient here POV from Home. ? ?Family endorses Weight Loss, Urinary Incontinence, and General Forgetfulness for "some time". Symptoms have been more prevalent in the Past 2-3 Weeks. Family called PCP as he has not been assessed in Several Years and was referred to ED for Evaluation). ? ?No Known Fevers. No Changes in BM. No N/V.  ? ?NAD Noted during Triage. A&Ox3. (Patient unsure of Month/Year). GCS 15. Ambulatory.  ?

## 2022-04-06 ENCOUNTER — Emergency Department (HOSPITAL_COMMUNITY): Payer: Medicare Other

## 2022-04-06 ENCOUNTER — Inpatient Hospital Stay (HOSPITAL_COMMUNITY)
Admission: EM | Admit: 2022-04-06 | Discharge: 2022-04-10 | DRG: 689 | Disposition: A | Discharge status: Skilled Nursing Facility | Payer: Medicare Other | Attending: Internal Medicine | Admitting: Internal Medicine

## 2022-04-06 DIAGNOSIS — N390 Urinary tract infection, site not specified: Principal | ICD-10-CM | POA: Diagnosis present

## 2022-04-06 DIAGNOSIS — G9340 Encephalopathy, unspecified: Secondary | ICD-10-CM | POA: Diagnosis present

## 2022-04-06 DIAGNOSIS — Z20822 Contact with and (suspected) exposure to covid-19: Secondary | ICD-10-CM | POA: Diagnosis present

## 2022-04-06 DIAGNOSIS — E039 Hypothyroidism, unspecified: Secondary | ICD-10-CM | POA: Diagnosis present

## 2022-04-06 DIAGNOSIS — G9341 Metabolic encephalopathy: Secondary | ICD-10-CM | POA: Diagnosis not present

## 2022-04-06 DIAGNOSIS — Z789 Other specified health status: Secondary | ICD-10-CM | POA: Diagnosis not present

## 2022-04-06 DIAGNOSIS — F1090 Alcohol use, unspecified, uncomplicated: Secondary | ICD-10-CM | POA: Diagnosis present

## 2022-04-06 DIAGNOSIS — E119 Type 2 diabetes mellitus without complications: Secondary | ICD-10-CM | POA: Diagnosis present

## 2022-04-06 DIAGNOSIS — W19XXXA Unspecified fall, initial encounter: Secondary | ICD-10-CM | POA: Diagnosis present

## 2022-04-06 DIAGNOSIS — S0001XA Abrasion of scalp, initial encounter: Secondary | ICD-10-CM | POA: Diagnosis present

## 2022-04-06 DIAGNOSIS — Z85038 Personal history of other malignant neoplasm of large intestine: Secondary | ICD-10-CM

## 2022-04-06 DIAGNOSIS — R413 Other amnesia: Secondary | ICD-10-CM | POA: Diagnosis present

## 2022-04-06 DIAGNOSIS — S0081XA Abrasion of other part of head, initial encounter: Secondary | ICD-10-CM | POA: Diagnosis present

## 2022-04-06 DIAGNOSIS — Z87891 Personal history of nicotine dependence: Secondary | ICD-10-CM

## 2022-04-06 DIAGNOSIS — R296 Repeated falls: Secondary | ICD-10-CM | POA: Diagnosis present

## 2022-04-06 DIAGNOSIS — J90 Pleural effusion, not elsewhere classified: Secondary | ICD-10-CM | POA: Diagnosis present

## 2022-04-06 DIAGNOSIS — G9349 Other encephalopathy: Secondary | ICD-10-CM | POA: Diagnosis present

## 2022-04-06 DIAGNOSIS — R32 Unspecified urinary incontinence: Secondary | ICD-10-CM | POA: Diagnosis present

## 2022-04-06 DIAGNOSIS — F0392 Unspecified dementia, unspecified severity, with psychotic disturbance: Secondary | ICD-10-CM | POA: Diagnosis present

## 2022-04-06 DIAGNOSIS — Z882 Allergy status to sulfonamides status: Secondary | ICD-10-CM

## 2022-04-06 DIAGNOSIS — A419 Sepsis, unspecified organism: Secondary | ICD-10-CM

## 2022-04-06 DIAGNOSIS — S00511A Abrasion of lip, initial encounter: Secondary | ICD-10-CM | POA: Diagnosis present

## 2022-04-06 LAB — VALPROIC ACID LEVEL: Valproic Acid Lvl: <10 ug/mL — ABNORMAL LOW (ref 50.0–100.0)

## 2022-04-06 LAB — CBC WITH DIFFERENTIAL/PLATELET
Abs Immature Granulocytes: 0 10*3/uL (ref 0.00–0.07)
Basophils Absolute: 0 10*3/uL (ref 0.0–0.1)
Basophils Relative: 0 %
Eosinophils Absolute: 0 10*3/uL (ref 0.0–0.5)
Eosinophils Relative: 0 %
HCT: 43.1 % (ref 39.0–52.0)
Hemoglobin: 14.5 g/dL (ref 13.0–17.0)
Lymphocytes Relative: 6 %
Lymphs Abs: 0.5 10*3/uL — ABNORMAL LOW (ref 0.7–4.0)
MCH: 31.2 pg (ref 26.0–34.0)
MCHC: 33.6 g/dL (ref 30.0–36.0)
MCV: 92.7 fL (ref 80.0–100.0)
Monocytes Absolute: 0.3 10*3/uL (ref 0.1–1.0)
Monocytes Relative: 4 %
Neutro Abs: 7.3 10*3/uL (ref 1.7–7.7)
Neutrophils Relative %: 90 %
Platelets: 175 10*3/uL (ref 150–400)
RBC: 4.65 MIL/uL (ref 4.22–5.81)
RDW: 13.3 % (ref 11.5–15.5)
WBC: 8.1 10*3/uL (ref 4.0–10.5)
nRBC: 0 % (ref 0.0–0.2)
nRBC: 0 /100 WBC

## 2022-04-06 LAB — LACTIC ACID, PLASMA: Lactic Acid, Venous: 2.2 mmol/L (ref 0.5–1.9)

## 2022-04-06 LAB — COMPREHENSIVE METABOLIC PANEL
ALT: 9 U/L (ref 0–44)
AST: 24 U/L (ref 15–41)
Albumin: 4.1 g/dL (ref 3.5–5.0)
Alkaline Phosphatase: 69 U/L (ref 38–126)
Anion gap: 10 (ref 5–15)
BUN: 15 mg/dL (ref 8–23)
CO2: 23 mmol/L (ref 22–32)
Calcium: 10.4 mg/dL — ABNORMAL HIGH (ref 8.9–10.3)
Chloride: 103 mmol/L (ref 98–111)
Creatinine, Ser: 1.12 mg/dL (ref 0.61–1.24)
GFR, Estimated: >60 mL/min (ref >60)
Glucose, Bld: 168 mg/dL — ABNORMAL HIGH (ref 70–99)
Potassium: 3.8 mmol/L (ref 3.5–5.1)
Sodium: 136 mmol/L (ref 135–145)
Total Bilirubin: 1.5 mg/dL — ABNORMAL HIGH (ref 0.3–1.2)
Total Protein: 8.2 g/dL — ABNORMAL HIGH (ref 6.5–8.1)

## 2022-04-06 LAB — URINALYSIS, ROUTINE W REFLEX MICROSCOPIC
Bilirubin Urine: NEGATIVE
Glucose, UA: NEGATIVE mg/dL
Ketones, ur: 5 mg/dL — AB
Nitrite: NEGATIVE
Protein, ur: 30 mg/dL — AB
Specific Gravity, Urine: 1.011 (ref 1.005–1.030)
WBC, UA: >50 WBC/hpf — ABNORMAL HIGH (ref 0–5)
pH: 6 (ref 5.0–8.0)

## 2022-04-06 LAB — CK: Total CK: 566 U/L — ABNORMAL HIGH (ref 49–397)

## 2022-04-06 MED ORDER — SODIUM CHLORIDE 0.9 % IV BOLUS (SEPSIS)
1000.0000 mL | Freq: Once | INTRAVENOUS | Status: AC
  Administered 2022-04-06: 1000 mL via INTRAVENOUS

## 2022-04-06 MED ORDER — ONDANSETRON HCL 4 MG PO TABS: 4.0000 mg | ORAL_TABLET | Freq: Four times a day (QID) | ORAL | Status: DC | PRN

## 2022-04-06 MED ORDER — CEFTRIAXONE SODIUM 1 G IJ SOLR
1.0000 g | INTRAMUSCULAR | Status: DC
  Administered 2022-04-07 – 2022-04-10 (×4): 1 g via INTRAVENOUS
  Filled 2022-04-06 (×4): qty 10

## 2022-04-06 MED ORDER — LORAZEPAM 2 MG/ML IJ SOLN: 1.0000 mg | INTRAMUSCULAR | Status: DC | PRN

## 2022-04-06 MED ORDER — ENOXAPARIN SODIUM 40 MG/0.4ML IJ SOSY
40.0000 mg | PREFILLED_SYRINGE | INTRAMUSCULAR | Status: DC
  Administered 2022-04-07 – 2022-04-10 (×4): 40 mg via SUBCUTANEOUS
  Filled 2022-04-06 (×4): qty 0.4

## 2022-04-06 MED ORDER — VANCOMYCIN HCL IN DEXTROSE 1-5 GM/200ML-% IV SOLN: 1000.0000 mg | Freq: Once | INTRAVENOUS | Status: DC

## 2022-04-06 MED ORDER — SODIUM CHLORIDE 0.9 % IV SOLN: 2.0000 g | Freq: Two times a day (BID) | INTRAVENOUS | Status: DC

## 2022-04-06 MED ORDER — THIAMINE HCL 100 MG/ML IJ SOLN
100.0000 mg | Freq: Every day | INTRAMUSCULAR | Status: DC
  Administered 2022-04-06: 100 mg via INTRAVENOUS
  Filled 2022-04-06: qty 2

## 2022-04-06 MED ORDER — ONDANSETRON HCL 4 MG/2ML IJ SOLN: 4.0000 mg | Freq: Four times a day (QID) | INTRAMUSCULAR | Status: DC | PRN

## 2022-04-06 MED ORDER — ACETAMINOPHEN 650 MG RE SUPP
650.0000 mg | Freq: Once | RECTAL | Status: AC
Start: 2022-04-06 — End: 2022-04-06
  Administered 2022-04-06: 650 mg via RECTAL
  Filled 2022-04-06: qty 1

## 2022-04-06 MED ORDER — THIAMINE HCL 100 MG PO TABS
100.0000 mg | ORAL_TABLET | Freq: Every day | ORAL | Status: DC
  Administered 2022-04-07 – 2022-04-10 (×4): 100 mg via ORAL
  Filled 2022-04-06 (×5): qty 1

## 2022-04-06 MED ORDER — LORAZEPAM 1 MG PO TABS: 1.0000 mg | ORAL_TABLET | ORAL | Status: DC | PRN

## 2022-04-06 MED ORDER — ACETAMINOPHEN 650 MG RE SUPP: 650.0000 mg | Freq: Four times a day (QID) | RECTAL | Status: DC | PRN

## 2022-04-06 MED ORDER — LACTATED RINGERS IV SOLN: INTRAVENOUS | Status: AC

## 2022-04-06 MED ORDER — METRONIDAZOLE 500 MG/100ML IV SOLN
500.0000 mg | Freq: Once | INTRAVENOUS | Status: AC
  Administered 2022-04-06: 500 mg via INTRAVENOUS
  Filled 2022-04-06: qty 100

## 2022-04-06 MED ORDER — ACETAMINOPHEN 325 MG PO TABS
650.0000 mg | ORAL_TABLET | Freq: Four times a day (QID) | ORAL | Status: DC | PRN
  Filled 2022-04-06: qty 2

## 2022-04-06 MED ORDER — SODIUM CHLORIDE 0.9 % IV SOLN
2.0000 g | Freq: Once | INTRAVENOUS | Status: AC
  Administered 2022-04-06: 2 g via INTRAVENOUS
  Filled 2022-04-06: qty 12.5

## 2022-04-06 MED ORDER — ADULT MULTIVITAMIN W/MINERALS CH
1.0000 | ORAL_TABLET | Freq: Every day | ORAL | Status: DC
  Administered 2022-04-06 – 2022-04-10 (×5): 1 via ORAL
  Filled 2022-04-06 (×5): qty 1

## 2022-04-06 MED ORDER — FOLIC ACID 1 MG PO TABS
1.0000 mg | ORAL_TABLET | Freq: Every day | ORAL | Status: DC
  Administered 2022-04-06 – 2022-04-10 (×5): 1 mg via ORAL
  Filled 2022-04-06 (×5): qty 1

## 2022-04-06 MED ORDER — VANCOMYCIN HCL 1500 MG/300ML IV SOLN
1500.0000 mg | INTRAVENOUS | Status: DC
  Filled 2022-04-06: qty 300

## 2022-04-06 MED ORDER — VANCOMYCIN HCL 1750 MG/350ML IV SOLN
1750.0000 mg | Freq: Once | INTRAVENOUS | Status: AC
  Administered 2022-04-06: 1750 mg via INTRAVENOUS
  Filled 2022-04-06: qty 350

## 2022-04-06 NOTE — ED Provider Notes (Signed)
?[facility] ?Provider Note ? ? ?CSN: [id number] ?Arrival date & time: 04/06/22  1725 ? ?  ? ?History ? ?Chief Complaint  ?Patient presents with  ? Fall  ? Code Sepsis  ? ? ?Keith Pittman is a 88 y.o. male hx of subdural hemorrhage, UTI, here with AMS. Patient was seen in the ED 4 days ago for failure to thrive. Patient's labs were unremarkable at that time. He was discharged home. This morning, he was walking around his apartment around 9 am. Family came to check on him this afternoon and he was slumped over and had abrasions R face and lip. Patient also had incontinence as well. No seizure activity was noted. Also noted to be febrile per EMS  ? ?The history is provided by the patient and the EMS personnel.  ? ?  ? ?Home Medications ?Prior to Admission medications   ?Medication Sig Start Date End Date Taking? Authorizing Provider  ?divalproex (DEPAKOTE) 250 MG DR tablet Take 1 tablet (250 mg total) by mouth at bedtime. ?Patient not taking: Reported on 04/02/2022 10/04/19   Cristal Deer, MD  ?   ? ?Allergies    ?Sulfa antibiotics   ? ?Review of Systems   ?Review of Systems  ?Neurological:  Positive for dizziness and weakness.  ?All other systems reviewed and are negative. ? ?Physical Exam ?Updated Vital Signs ?BP 138/75 (BP Location: Right Arm)   Pulse 92   Temp (!) 101.3 ?F (38.5 ?C) (Oral)   Resp 16   SpO2 99%  ?Physical Exam ?Vitals and nursing note reviewed.  ?Constitutional:   ?   Comments: Difficult to arouse, not giving much history. Disheveled   ?HENT:  ?   Head:  ?   Comments: Abrasion R scalp  ?   Mouth/Throat:  ?   Comments: Abrasion R face and lip  ?Eyes:  ?   Pupils: Pupils are equal, round, and reactive to light.  ?   Comments: No obvious eye deviation   ?Neck:  ?   Comments: C collar in place  ?Cardiovascular:  ?   Rate and Rhythm: Regular rhythm. Tachycardia present.  ?   Pulses: Normal pulses.  ?   Heart sounds: Normal heart sounds.  ?Pulmonary:  ?    Effort: Pulmonary effort is normal.  ?   Comments: Diminished breath sounds bilaterally  ?Abdominal:  ?   General: Abdomen is flat.  ?   Palpations: Abdomen is soft.  ?Musculoskeletal:     ?   General: Normal range of motion.  ?Skin: ?   General: Skin is warm.  ?   Capillary Refill: Capillary refill takes less than 2 seconds.  ?Neurological:  ?   Comments: Altered, confused, moving all extremities   ?Psychiatric:  ?   Comments: Unable   ? ? ?ED Results / Procedures / Treatments   ?Labs ?(all labs ordered are listed, but only abnormal results are displayed) ?Labs Reviewed  ?RESP PANEL BY RT-PCR (FLU A&B, COVID) [id number]  ?CULTURE, BLOOD (ROUTINE X 2)  ?CULTURE, BLOOD (ROUTINE X 2)  ?URINE CULTURE  ?LACTIC ACID, PLASMA  ?LACTIC ACID, PLASMA  ?COMPREHENSIVE METABOLIC PANEL  ?CBC WITH DIFFERENTIAL/PLATELET  ?URINALYSIS, ROUTINE W REFLEX MICROSCOPIC  ?CK  ? ? ?EKG ?None ? ?Radiology ?No results found. ? ?Procedures ?Procedures  ? ? ?CRITICAL CARE ?Performed by: Wandra Arthurs ? ? ?Total critical care time: 30 minutes ? ?Critical care time was exclusive of separately billable procedures and treating other patients. ? ?  Critical care was necessary to treat or prevent imminent or life-threatening deterioration. ? ?Critical care was time spent personally by me on the following activities: development of treatment plan with patient and/or surrogate as well as nursing, discussions with consultants, evaluation of patient's response to treatment, examination of patient, obtaining history from patient or surrogate, ordering and performing treatments and interventions, ordering and review of laboratory studies, ordering and review of radiographic studies, pulse oximetry and re-evaluation of patient's condition. ? ? ?Medications Ordered in ED ?Medications  ?lactated ringers infusion (has no administration in time range)  ?sodium chloride 0.9 % bolus 1,000 mL (has no administration in time range)  ?ceFEPIme (MAXIPIME) 2 g in sodium  chloride 0.9 % 100 mL IVPB (has no administration in time range)  ?metroNIDAZOLE (FLAGYL) IVPB 500 mg (has no administration in time range)  ?vancomycin (VANCOCIN) IVPB 1000 mg/200 mL premix (has no administration in time range)  ?acetaminophen (TYLENOL) suppository 650 mg (has no administration in time range)  ? ? ?ED Course/ Medical Decision Making/ A&P ?  ?                        ?Medical Decision Making ?Keith Pittman is a 88 y.o. male here presenting with fever and confusion.  Patient was found on the side of the bed and patient cannot give me any history.  Patient is febrile 101.3.  We will do sepsis work-up with CBC and CMP and lactate and cultures and UA and urine culture.  We will also get CT head and cervical spine and CT maxillofacial given signs of head and facial trauma.  ? ?8:43 PM ?I independently reviewed and interpreted his labs and his imaging.  Labs showed normal white blood cell count but elevated lactate of 2.2.  His urinalysis showed UTI.  Chest x-ray showed pleural effusion.  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  Patient is very confused still.  I cleared his c-collar.  Hospitalist to admit for encephalopathy secondary to UTI ? ? ?Problems Addressed: ?Sepsis, due to unspecified organism, unspecified whether acute organ dysfunction present [facility]): acute illness or injury ?Urinary tract infection without hematuria, site unspecified: acute illness or injury ? ?Amount and/or Complexity of Data Reviewed ?Labs: ordered. Decision-making details documented in ED Course. ?Radiology: ordered and independent interpretation performed. Decision-making details documented in ED Course. ?ECG/medicine tests: ordered and independent interpretation performed. Decision-making details documented in ED Course. ? ?Risk ?OTC drugs. ?Prescription drug management. ? ? ? ?Final Clinical Impression(s) / ED Diagnoses ?Final diagnoses:  ?None  ? ? ?Rx / DC Orders ?ED  Discharge Orders   ? ? None  ? ?  ? ? ?  ?Drenda Freeze, MD ?04/06/22 2045 ? ?

## 2022-04-06 NOTE — Assessment & Plan Note (Signed)
1. Rocephin ?2. Culture pending ?3. Tylenol PRN fever ?

## 2022-04-06 NOTE — Progress Notes (Signed)
Pharmacy Antibiotic Note ? ?Keith Pittman is a 88 y.o. male admitted on 04/06/2022 with  sepsis secondary to unknown source .  Pharmacy has been consulted for cefepime and vancomycin dosing. ? ?WBC wnl, SCr wnl  ? ?Plan: ?-Vancomycin 1750 mg IV load followed by Vancomycin 1500 mg IV Q 24 hrs. Goal AUC 400-550. ?Expected AUC: 487 ?SCr used: 1.12 ?-Cefepime 2 gm IV Q 12 hours  ?-Monitor CBC, renal fx, cultures and clinical progress ?-Vanc levels as indicated  ? ? ?  ? ?Temp (24hrs), Avg:101.3 ?F (38.5 ?C), Min:101.3 ?F (38.5 ?C), Max:101.3 ?F (38.5 ?C) ? ?Recent Labs  ?Lab 04/02/22 ?1357  ?WBC 4.8  ?CREATININE 0.95  ?  ?Estimated Creatinine Clearance: 64.2 mL/min (by C-G formula based on SCr of 0.95 mg/dL).   ? ?Allergies  ?Allergen Reactions  ? Sulfa Antibiotics Other (See Comments)  ?  Sulfa-containing drugs (reaction??- occurred in childhood) [facility] has this [facility], but no known reaction ?  ? ? ?Antimicrobials this admission: ?Cefepime 5/13 >>  ?Vancomycin 5/13 >>  ?Metronidazole 5/13 >>  ? ?Dose adjustments this admission: ? ? ?Microbiology results: ?5/13 BCx:  ?5/13 UCx:   ? ? ?Thank you for allowing pharmacy to be a part of this patient?s care. ? ?Albertina Parr, PharmD., BCCCP ?Clinical Pharmacist ?Please refer to AMION for unit-specific pharmacist  ? ? ?

## 2022-04-06 NOTE — Assessment & Plan Note (Signed)
CIWA 

## 2022-04-06 NOTE — ED Triage Notes (Signed)
Pt BIB EMS from home after family called saying they could no longer see him on the camera. He was last seen walking around his apt. At 0900. When EMS arrived he was found on the floor between nightstand and bed. Pt has abrasions on left side of face and under left bottom lip. Pt has multiple dried incontinent episodes with strong urine smell. Pt mental status has been steadily worsening over the last month per family.  ?

## 2022-04-06 NOTE — Assessment & Plan Note (Signed)
TSH mildly elevated ?1. Check T4 ?2. Start synthroid if T4 low ?

## 2022-04-06 NOTE — Assessment & Plan Note (Addendum)
Acute delirium from fever + UTI on top of a more chronic dementia it sounds like. ?Lives at home alone at baseline but not taking care of self it sounds like.  Family has been trying to get him to go to ALF / SNF for 3 years now it looks like (he doesn't want to go). ?

## 2022-04-06 NOTE — H&P (Signed)
?History and Physical  ? ? ?Patient: Keith Pittman IRJ:[id number] DOB: 23-Mar-1932 ?DOA: 04/06/2022 ?DOS: the patient was seen and examined on 04/06/2022 ?PCP: Keith Orn, MD  ?Patient coming from: Home ? ?Chief Complaint:  ?Chief Complaint  ?Patient presents with  ? Fall  ? Code Sepsis  ? ?HPI: Keith Pittman is a 88 y.o. male with medical history significant of colon CA s/p surgery, multiple falls in past with chronic SDHs, mild memory problems, chronic EtOH use. ? ?Pt brought in to ED 4 days ago for ? UTI, poor PO intake at home and not taking care of himself.  Labs normal at that time, pt didn't want to urinate in ED and ended up getting discharged home. ? ?This morning, he was walking around his apartment around 9 am. Family came to check on him this afternoon and he was slumped over and had abrasions R face and lip. Patient also had incontinence as well. No seizure activity was noted. Also noted to be febrile per EMS  ? ?  ?Review of Systems: As mentioned in the history of present illness. All other systems reviewed and are negative. ?Past Medical History:  ?Diagnosis Date  ? Cancer [facility])   ? colon  ? Diabetes mellitus without complication ([place])   ? ?Past Surgical History:  ?Procedure Laterality Date  ? COLON SURGERY    ? ?Social History:  reports that he has quit smoking. He has never used smokeless tobacco. He reports that he does not currently use alcohol. He reports that he does not use drugs. ? ?Allergies  ?Allergen Reactions  ? Sulfa Antibiotics Other (See Comments)  ?  Sulfa-containing drugs (reaction??- occurred in childhood) [facility] has [facility], but no known reaction ?  ? ? ?No family history on file. ? ?Prior to Admission medications   ?Medication Sig Start Date End Date Taking? Authorizing Provider  ?divalproex (DEPAKOTE) 250 MG DR tablet Take 1 tablet (250 mg total) by mouth at bedtime. ?Patient not taking: Reported on 04/02/2022 10/04/19   Cristal Deer, MD  ? ? ?Physical Exam: ?Vitals:  ?  04/06/22 1738 04/06/22 1915  ?BP: 138/75 134/71  ?Pulse: 92 79  ?Resp: 16 16  ?Temp: (!) 101.3 ?F (38.5 ?C)   ?TempSrc: Oral   ?SpO2: 99% 97%  ? ?Constitutional: Disheveled, difficult to arouse, not giving much history. ?Eyes: PERRL, lids and conjunctivae normal ?ENMT: Mucous membranes are moist. Posterior pharynx clear of any exudate or lesions.Normal dentition.  ?Neck: normal, supple, no masses, no thyromegaly ?Respiratory: clear to auscultation bilaterally, no wheezing, no crackles. Normal respiratory effort. No accessory muscle use.  ?Cardiovascular: Regular rate and rhythm, no murmurs / rubs / gallops. No extremity edema. 2+ pedal pulses. No carotid bruits.  ?Abdomen: no tenderness, no masses palpated. No hepatosplenomegaly. Bowel sounds positive.  ?Musculoskeletal: no clubbing / cyanosis. No joint deformity upper and lower extremities. Good ROM, no contractures. Normal muscle tone.  ?Skin: Abrasions to scalp, face, lip ?Neurologic: MAE ?Psychiatric: Confused ? ?Data Reviewed: ? ?CT face, head and neck = chronic SDHs but no acute findings ? ?Urinalysis ?   ?Component Value Date/Time  ? [place] YELLOW 04/06/2022 2010  ? APPEARANCEUR CLOUDY (A) 04/06/2022 2010  ? LABSPEC 1.011 04/06/2022 2010  ? PHURINE 6.0 04/06/2022 2010  ? [place] NEGATIVE 04/06/2022 2010  ? HGBUR MODERATE (A) 04/06/2022 2010  ? [place] NEGATIVE 04/06/2022 2010  ? KETONESUR 5 (A) 04/06/2022 2010  ? PROTEINUR 30 (A) 04/06/2022 2010  ? NITRITE NEGATIVE 04/06/2022 2010  ?  LEUKOCYTESUR LARGE (A) 04/06/2022 2010  ? ?Lactate 2.2 ? ?CBC and CMP unremarkable other than BGL 168, T.bili 1.5, and Calcium 10.4. ? ?TSH 7.4 ? ?Assessment and Plan: ?* Lower urinary tract infectious disease ?Rocephin ?Culture pending ?Tylenol PRN fever ? ?Hypothyroidism ?TSH mildly elevated ?Check T4 ?Start synthroid if T4 low ? ?Acute metabolic encephalopathy ?Acute delirium from fever + UTI on top of a more chronic dementia it sounds like. ?Lives at home alone at  baseline but not taking care of self it sounds like.  Family has been trying to get him to go to ALF / SNF for 3 years now it looks like (he doesn't want to go). ? ?Alcohol use ?CIWA ? ? ? ? ? Advance Care Planning:   Code Status: Full Code ? ?Consults: None ? ?Family Communication: No family in room ? ?Severity of Illness: ?The appropriate patient status for this patient is OBSERVATION. Observation status is judged to be reasonable and necessary in order to provide the required intensity of service to ensure the patient's safety. The patient's presenting symptoms, physical exam findings, and initial radiographic and laboratory data in the context of their medical condition is felt to place them at decreased risk for further clinical deterioration. Furthermore, it is anticipated that the patient will be medically stable for discharge from the hospital within 2 midnights of admission.  ? ?Author: ?Etta Quill., DO ?04/06/2022 9:13 PM ? ?For on call review [email or web address].  ?

## 2022-04-06 NOTE — ED Notes (Signed)
Patient transported to CT 

## 2022-04-07 ENCOUNTER — Encounter (HOSPITAL_COMMUNITY): Payer: Self-pay | Admitting: Internal Medicine

## 2022-04-07 ENCOUNTER — Other Ambulatory Visit: Payer: Self-pay

## 2022-04-07 DIAGNOSIS — R296 Repeated falls: Secondary | ICD-10-CM | POA: Diagnosis present

## 2022-04-07 DIAGNOSIS — S0001XA Abrasion of scalp, initial encounter: Secondary | ICD-10-CM | POA: Diagnosis present

## 2022-04-07 DIAGNOSIS — N390 Urinary tract infection, site not specified: Secondary | ICD-10-CM | POA: Diagnosis present

## 2022-04-07 DIAGNOSIS — F0392 Unspecified dementia, unspecified severity, with psychotic disturbance: Secondary | ICD-10-CM | POA: Diagnosis present

## 2022-04-07 DIAGNOSIS — Z882 Allergy status to sulfonamides status: Secondary | ICD-10-CM | POA: Diagnosis not present

## 2022-04-07 DIAGNOSIS — E039 Hypothyroidism, unspecified: Secondary | ICD-10-CM | POA: Diagnosis present

## 2022-04-07 DIAGNOSIS — S0081XA Abrasion of other part of head, initial encounter: Secondary | ICD-10-CM | POA: Diagnosis present

## 2022-04-07 DIAGNOSIS — G9341 Metabolic encephalopathy: Secondary | ICD-10-CM | POA: Diagnosis present

## 2022-04-07 DIAGNOSIS — G9340 Encephalopathy, unspecified: Secondary | ICD-10-CM | POA: Diagnosis present

## 2022-04-07 DIAGNOSIS — J90 Pleural effusion, not elsewhere classified: Secondary | ICD-10-CM | POA: Diagnosis present

## 2022-04-07 DIAGNOSIS — R32 Unspecified urinary incontinence: Secondary | ICD-10-CM | POA: Diagnosis present

## 2022-04-07 DIAGNOSIS — E119 Type 2 diabetes mellitus without complications: Secondary | ICD-10-CM | POA: Diagnosis present

## 2022-04-07 DIAGNOSIS — G9349 Other encephalopathy: Secondary | ICD-10-CM | POA: Diagnosis present

## 2022-04-07 DIAGNOSIS — Z87891 Personal history of nicotine dependence: Secondary | ICD-10-CM | POA: Diagnosis not present

## 2022-04-07 DIAGNOSIS — W19XXXA Unspecified fall, initial encounter: Secondary | ICD-10-CM | POA: Diagnosis present

## 2022-04-07 DIAGNOSIS — Z20822 Contact with and (suspected) exposure to covid-19: Secondary | ICD-10-CM | POA: Diagnosis present

## 2022-04-07 DIAGNOSIS — R413 Other amnesia: Secondary | ICD-10-CM | POA: Diagnosis present

## 2022-04-07 DIAGNOSIS — S00511A Abrasion of lip, initial encounter: Secondary | ICD-10-CM | POA: Diagnosis present

## 2022-04-07 DIAGNOSIS — Z85038 Personal history of other malignant neoplasm of large intestine: Secondary | ICD-10-CM | POA: Diagnosis not present

## 2022-04-07 LAB — CBC
HCT: 43.5 % (ref 39.0–52.0)
Hemoglobin: 14.3 g/dL (ref 13.0–17.0)
MCH: 31.6 pg (ref 26.0–34.0)
MCHC: 32.9 g/dL (ref 30.0–36.0)
MCV: 96.2 fL (ref 80.0–100.0)
Platelets: 151 10*3/uL (ref 150–400)
RBC: 4.52 MIL/uL (ref 4.22–5.81)
RDW: 13.5 % (ref 11.5–15.5)
WBC: 7.2 10*3/uL (ref 4.0–10.5)
nRBC: 0 % (ref 0.0–0.2)

## 2022-04-07 LAB — BASIC METABOLIC PANEL
Anion gap: 10 (ref 5–15)
BUN: 14 mg/dL (ref 8–23)
CO2: 19 mmol/L — ABNORMAL LOW (ref 22–32)
Calcium: 9.6 mg/dL (ref 8.9–10.3)
Chloride: 108 mmol/L (ref 98–111)
Creatinine, Ser: 0.92 mg/dL (ref 0.61–1.24)
GFR, Estimated: >60 mL/min (ref >60)
Glucose, Bld: 111 mg/dL — ABNORMAL HIGH (ref 70–99)
Potassium: 4.1 mmol/L (ref 3.5–5.1)
Sodium: 137 mmol/L (ref 135–145)

## 2022-04-07 LAB — RESP PANEL BY RT-PCR (FLU A&B, COVID) ARPGX2
Influenza A by PCR: NEGATIVE
Influenza B by PCR: NEGATIVE
SARS Coronavirus 2 by RT PCR: NEGATIVE

## 2022-04-07 LAB — LACTIC ACID, PLASMA: Lactic Acid, Venous: 2.3 mmol/L (ref 0.5–1.9)

## 2022-04-07 LAB — GLUCOSE, CAPILLARY: Glucose-Capillary: 98 mg/dL (ref 70–99)

## 2022-04-07 LAB — T4, FREE: Free T4: 0.86 ng/dL (ref 0.61–1.12)

## 2022-04-07 NOTE — Evaluation (Signed)
Physical Therapy Evaluation ?Patient Details ?Name: Keith Pittman ?MRN: [id number] ?DOB: [birth year] ?Today's Date: 04/07/2022 ? ?History of Present Illness ? 88 y/o male presented to ED on 04/06/22 after being found slumped over with abrasions of face/lip and incontinent. Found to have UTI and acute metabolic encephalopathy. PMH: hx of colon cancer, chronic SDH, mild memory problems, ongoing chronic alcohol use, diabetes  ?Clinical Impression ? Patient admitted with the above. Patient is poor historian and oriented to self and place so difficult to obtain reliable information about PLOF, but seems to have had worsening cognition and falls per chart review. Patient presents with weakness, impaired balance, decreased activity tolerance, impaired functional mobility, and impaired cognition. Patient required modA for bed mobility and minA+2 for sit to stand transfer and pre-gait activities due to limited space and agitation. Patient became easily agitated with questions and commands. Patient will benefit from skilled PT services during acute stay to address listed deficits. Recommend SNF at discharge as patient is not safe to return home alone and will need 24 hour assistance if he were to discharge home.    ?   ? ?Recommendations for follow up therapy are one component of a multi-disciplinary discharge planning process, led by the attending physician.  Recommendations may be updated based on patient status, additional functional criteria and insurance authorization. ? ?Follow Up Recommendations Skilled nursing-short term rehab (<3 hours/day) ? ?  ?Assistance Recommended at Discharge Frequent or constant Supervision/Assistance  ?Patient can return home with the following ? A lot of help with walking and/or transfers;A lot of help with bathing/dressing/bathroom;Assistance with cooking/housework;Direct supervision/assist for medications management;Direct supervision/assist for financial management;Assist for  transportation;Help with stairs or ramp for entrance ? ?  ?Equipment Recommendations Other (comment) (TBD)  ?Recommendations for Other Services ?    ?  ?Functional Status Assessment Patient has had a recent decline in their functional status and demonstrates the ability to make significant improvements in function in a reasonable and predictable amount of time.  ? ?  ?Precautions / Restrictions Precautions ?Precautions: Fall ?Restrictions ?Weight Bearing Restrictions: No  ? ?  ? ?Mobility ? Bed Mobility ?Overal bed mobility: Needs Assistance ?Bed Mobility: Supine to Sit, Sit to Supine ?  ?  ?Supine to sit: Mod assist ?Sit to supine: Min assist ?  ?General bed mobility comments: modA for trunk elevation to sit EOB with patient reaching for therapist hand. MinA to return to supine with assist for LE management ?  ? ?Transfers ?Overall transfer level: Needs assistance ?Equipment used: None ?Transfers: Sit to/from Stand ?Sit to Stand: Min assist, +2 physical assistance, +2 safety/equipment ?  ?  ?  ?  ?  ?General transfer comment: minA+2 to stand from stretcher and patient immediately reaching out for something to grasp. ?  ? ?Ambulation/Gait ?Ambulation/Gait assistance: Min assist, +2 physical assistance, +2 safety/equipment ?Gait Distance (Feet): 5 Feet ?Assistive device: 1 person hand held assist ?Gait Pattern/deviations: Step-to pattern, Decreased stride length, Staggering right ?Gait velocity: decreased ?  ?  ?General Gait Details: required minA+2 to take steps fwd/bwd/sidestepping due to balance and mild R knee buckling 2/2 weakness. Patient unaware of RLE weakness and need for assistance. ? ?Stairs ?  ?  ?  ?  ?  ? ?Wheelchair Mobility ?  ? ?Modified Rankin (Stroke Patients Only) ?  ? ?  ? ?Balance   ?  ?  ?  ?  ?  ?  ?  ?  ?  ?  ?  ?  ?  ?  ?  ?  ?  ?  ?   ? ? ? ?  Pertinent Vitals/Pain Pain Assessment ?Pain Assessment: Faces ?Faces Pain Scale: No hurt ?Pain Intervention(s): Monitored during session  ? ? ?Home  Living Family/patient expects to be discharged to:: Skilled nursing facility ?  ?  ?  ?  ?  ?  ?  ?  ?  ?Additional Comments: per chart review, patient lives alone and family watches him on camera. Per MD, unsafe discharge plan back to home alone  ?  ?Prior Function Prior Level of Function : Patient poor historian/Family not available ?  ?  ?  ?  ?  ?  ?Mobility Comments: patient reports not using AD to ambulate but unreliable historian due to impaired cognition ?  ?  ? ? ?Hand Dominance  ?   ? ?  ?Extremity/Trunk Assessment  ? Upper Extremity Assessment ?Upper Extremity Assessment: Defer to OT evaluation ?  ? ?Lower Extremity Assessment ?Lower Extremity Assessment: Generalized weakness;RLE deficits/detail ?RLE Deficits / Details: R LE 4/5 compared to L being 4+/5. R LE functionally weaker than L also ?  ? ?Cervical / Trunk Assessment ?Cervical / Trunk Assessment: Kyphotic  ?Communication  ? Communication: No difficulties  ?Cognition Arousal/Alertness: Awake/alert ?Behavior During Therapy: Agitated, Flat affect ?Overall Cognitive Status: No family/caregiver present to determine baseline cognitive functioning ?Area of Impairment: Orientation, Attention, Memory, Safety/judgement, Awareness, Problem solving ?  ?  ?  ?  ?  ?  ?  ?  ?Orientation Level: Disoriented to, Time, Situation ?Current Attention Level: Sustained ?Memory: Decreased short-term memory ?  ?Safety/Judgement: Decreased awareness of safety, Decreased awareness of deficits ?Awareness: Intellectual ?Problem Solving: Slow processing, Decreased initiation, Difficulty sequencing, Requires verbal cues ?General Comments: patient stating "september" and becomes agitated with reorientation. Patient unable to state why he is here. Easily agitated with questions and commands. Poor awareness into current deficits and need for assistance ?  ?  ? ?  ?General Comments   ? ?  ?Exercises    ? ?Assessment/Plan  ?  ?PT Assessment Patient needs continued PT services  ?PT  Problem List Decreased strength;Decreased activity tolerance;Decreased balance;Decreased mobility;Decreased coordination;Decreased cognition;Decreased safety awareness;Decreased knowledge of use of DME;Decreased knowledge of precautions ? ?   ?  ?PT Treatment Interventions DME instruction;Gait training;Functional mobility training;Therapeutic activities;Therapeutic exercise;Balance training;Patient/family education   ? ?PT Goals (Current goals can be found in the Care Plan section)  ?Acute Rehab PT Goals ?Patient Stated Goal: did not state ?PT Goal Formulation: Patient unable to participate in goal setting ?Time For Goal Achievement: 04/21/22 ?Potential to Achieve Goals: Fair ? ?  ?Frequency Min 2X/week ?  ? ? ?Co-evaluation PT/OT/SLP Co-Evaluation/Treatment: Yes ?Reason for Co-Treatment: For patient/therapist safety;To address functional/ADL transfers;Necessary to address cognition/behavior during functional activity ?PT goals addressed during session: Balance;Mobility/safety with mobility ?  ?  ? ? ?  ?AM-PAC PT "6 Clicks" Mobility  ?Outcome Measure Help needed turning from your back to your side while in a flat bed without using bedrails?: A Lot ?Help needed moving from lying on your back to sitting on the side of a flat bed without using bedrails?: A Lot ?Help needed moving to and from a bed to a chair (including a wheelchair)?: Total ?Help needed standing up from a chair using your arms (e.g., wheelchair or bedside chair)?: Total ?Help needed to walk in hospital room?: Total ?Help needed climbing 3-5 steps with a railing? : Total ?6 Click Score: 8 ? ?  ?End of Session Equipment Utilized During Treatment: Gait belt ?Activity Tolerance: Treatment limited secondary to agitation ?Patient left: in bed;with  call bell/phone within reach (on stretcher in ED) ?Nurse Communication: Mobility status ?PT Visit Diagnosis: Unsteadiness on feet (R26.81);Muscle weakness (generalized) (M62.81);History of falling  (Z91.81);Difficulty in walking, not elsewhere classified (R26.2) ?  ? ?Time: 2130-8657 ?PT Time Calculation (min) (ACUTE ONLY): 11 min ? ? ?Charges:   PT Evaluation ?$PT Eval Moderate Complexity: 1 Mod ?  ?  ?   ? ? ?Arie Gable A. Oshay Stranahan, P

## 2022-04-07 NOTE — Progress Notes (Signed)
?PROGRESS NOTE ? ? ? ?Keith Pittman  WOE:[id number] DOB: 09-Jul-1932 DOA: 04/06/2022 ?PCP: Lavone Orn, MD  ? ? ?Brief Narrative:  ?88 year old with history of colon cancer cervical surgery, multiple falls, chronic SDH and mild memory problems, ongoing chronic alcohol use brought to ER by EMS, he was found slumped over, abrasions of the face and lip and incontinent.  He lives alone.  Family called EMS as they did not see him on the camera.  Patient's mental status has been steadily worsening over the last few months and he has been declining to go to assisted living facility or get any help from the family.  In the emergency room, hemodynamically stable.  Urinalysis consistent with UTI.  Unsafe discharge. ? ? ?Assessment & Plan: ?  ?Acute metabolic encephalopathy, frequent falls with cognitive decline: ?Acute UTI present on admission likely causing worsening cognition. ? ?Admit. ?Continue Rocephin pending final cultures. ?Check for postvoid residual urine to rule out urinary retention. ?PT OT, case management consult for safety evaluation at home.  May need placement. ?On Depakote at home.  Low.  Resume Depakote 200 mg at night.  Probably not taking. ?TSH 7, no indication to treat. ?We will check O03 and folic acid levels. ? ?Alcoholism: Patient denies alcohol use.  Family reported ongoing every day drinking.  So far no evidence of alcohol withdrawal.  We will check thiamine levels along with B04 and folic acid.  Remains on CIWA protocol and multivitamins. ? ? ? ?DVT prophylaxis: enoxaparin (LOVENOX) injection 40 mg Start: 04/07/22 1000 ? ? ?Code Status: Full code ?Family Communication: None ?Disposition Plan: Status is: Observation ?The patient will require care spanning > 2 midnights and should be moved to inpatient because: IV antibiotics, unsafe discharge. ?  ? ? ?Consultants:  ?None ? ?Procedures:  ?None ? ?Antimicrobials:  ?Rocephin 5/13--- ? ? ?Subjective: ?Patient seen and examined.  Poor historian overall.   Denies any complaints.  He knows he is in the hospital.  Patient cannot tell me why he is here. ? ?Objective: ?Vitals:  ? 04/07/22 0700 04/07/22 0808 04/07/22 0900 04/07/22 1015  ?BP: 115/73  137/64 122/69  ?Pulse: 61  (!) 58 79  ?Resp: 15  18   ?Temp:      ?TempSrc:      ?SpO2: 96%  98% 98%  ?Weight:  94.1 kg    ?Height:  '5\' 11"'$  (1.803 m)    ? ? ?Intake/Output Summary (Last 24 hours) at 04/07/2022 1117 ?Last data filed at 04/07/2022 0758 ?Gross per 24 hour  ?Intake 606.41 ml  ?Output --  ?Net 606.41 ml  ? ?Filed Weights  ? 04/07/22 0808  ?Weight: 94.1 kg  ? ? ?Examination: ? ?General: Looks fairly comfortable.  Alert oriented x1-2.  Quiet and composed.  Flat affect. ?Cardiovascular: S1-S2 normal.  Regular rate rhythm. ?Respiratory: Bilateral clear.  No added sounds. ?Gastrointestinal: Soft.  Nontender.  Bowel sound present. ?Ext: No edema or cyanosis.  No swelling. ?Neuro: Moves all extremities. ? ? ? ? ? ?Data Reviewed: I have personally reviewed following labs and imaging studies ? ?CBC: ?Recent Labs  ?Lab 04/02/22 ?1357 04/06/22 ?1731 04/07/22 ?0453  ?WBC 4.8 8.1 7.2  ?NEUTROABS  --  7.3  --   ?HGB 13.8 14.5 14.3  ?HCT 42.2 43.1 43.5  ?MCV 92.1 92.7 96.2  ?PLT 200 175 151  ? ?Basic Metabolic Panel: ?Recent Labs  ?Lab 04/02/22 ?1357 04/06/22 ?1731 04/07/22 ?0453  ?NA 139 136 137  ?K 4.4 3.8 4.1  ?CL  104 103 108  ?CO2 26 23 19*  ?GLUCOSE 110* 168* 111*  ?BUN '16 15 14  '$ ?CREATININE 0.95 1.12 0.92  ?CALCIUM 11.2* 10.4* 9.6  ? ?GFR: ?Estimated Creatinine Clearance: 66.3 mL/min (by C-G formula based on SCr of 0.92 mg/dL). ?Liver Function Tests: ?Recent Labs  ?Lab 04/02/22 ?1357 04/06/22 ?1731  ?AST 14* 24  ?ALT <5 9  ?ALKPHOS 70 69  ?BILITOT 0.5 1.5*  ?PROT 7.7 8.2*  ?ALBUMIN 4.3 4.1  ? ?No results for input(s): LIPASE, AMYLASE in the last 168 hours. ?No results for input(s): AMMONIA in the last 168 hours. ?Coagulation Profile: ?No results for input(s): INR, PROTIME in the last 168 hours. ?Cardiac Enzymes: ?Recent Labs   ?Lab 04/06/22 ?1731  ?CKTOTAL 566*  ? ?BNP (last 3 results) ?No results for input(s): PROBNP in the last 8760 hours. ?HbA1C: ?No results for input(s): HGBA1C in the last 72 hours. ?CBG: ?Recent Labs  ?Lab 04/02/22 ?1353  ?GLUCAP 109*  ? ?Lipid Profile: ?No results for input(s): CHOL, HDL, LDLCALC, TRIG, CHOLHDL, LDLDIRECT in the last 72 hours. ?Thyroid Function Tests: ?Recent Labs  ?  04/07/22 ?0454  ?FREET4 0.86  ? ?Anemia Panel: ?No results for input(s): VITAMINB12, FOLATE, FERRITIN, TIBC, IRON, RETICCTPCT in the last 72 hours. ?Sepsis Labs: ?Recent Labs  ?Lab 04/06/22 ?1731 04/07/22 ?0453  ?LATICACIDVEN 2.2* 2.3*  ? ? ?Recent Results (from the past 240 hour(s))  ?Resp Panel by RT-PCR (Flu A&B, Covid) Nasopharyngeal Swab     Status: None  ? Collection Time: 04/06/22  5:31 PM  ? Specimen: Nasopharyngeal Swab; Nasopharyngeal(NP) swabs in vial transport medium  ?Result Value Ref Range Status  ? SARS Coronavirus 2 by RT PCR NEGATIVE NEGATIVE Final  ?  Comment: (NOTE) ?SARS-CoV-2 target nucleic acids are NOT DETECTED. ? ?The SARS-CoV-2 RNA is generally detectable in upper respiratory ?specimens during the acute phase of infection. The lowest ?concentration of SARS-CoV-2 viral copies this assay can detect is ?138 copies/mL. A negative result does not preclude SARS-Cov-2 ?infection and should not be used as the sole basis for treatment or ?other patient management decisions. A negative result may occur with  ?improper specimen collection/handling, submission of specimen other ?than nasopharyngeal swab, presence of viral mutation(s) within the ?areas targeted by this assay, and inadequate number of viral ?copies(<138 copies/mL). A negative result must be combined with ?clinical observations, patient history, and epidemiological ?information. The expected result is Negative. ? ?Fact Sheet for Patients:  ?EntrepreneurPulse.com.au ? ?Fact Sheet for Healthcare Providers:   ?IncredibleEmployment.be ? ?This test is no t yet approved or cleared by the [place] FDA and  ?has been authorized for detection and/or diagnosis of SARS-CoV-2 by ?FDA under an Emergency Use Authorization (EUA). This EUA will remain  ?in effect (meaning this test can be used) for the duration of the ?COVID-19 declaration under Section 564(b)(1) of the Act, 21 ?U.S.C.section 360bbb-3(b)(1), unless the authorization is terminated  ?or revoked sooner.  ? ? ?  ? Influenza A by PCR NEGATIVE NEGATIVE Final  ? Influenza B by PCR NEGATIVE NEGATIVE Final  ?  Comment: (NOTE) ?The Xpert Xpress SARS-CoV-2/FLU/RSV plus assay is intended as an aid ?in the diagnosis of influenza from Nasopharyngeal swab specimens and ?should not be used as a sole basis for treatment. Nasal washings and ?aspirates are unacceptable for Xpert Xpress SARS-CoV-2/FLU/RSV ?testing. ? ?Fact Sheet for Patients: ?EntrepreneurPulse.com.au ? ?Fact Sheet for Healthcare Providers: ?IncredibleEmployment.be ? ?This test is not yet approved or cleared by the [place] and ?has been  authorized for detection and/or diagnosis of SARS-CoV-2 by ?FDA under an Emergency Use Authorization (EUA). This EUA will remain ?in effect (meaning this test can be used) for the duration of the ?COVID-19 declaration under Section 564(b)(1) of the Act, 21 U.S.C. ?section 360bbb-3(b)(1), unless the authorization is terminated or ?revoked. ? ?Performed at [facility], [place] [address] [place], [place] ?[address] ?  ?  ? ? ? ? ? ?Radiology Studies: ?CT HEAD WO CONTRAST (5MM) ? ?Result Date: 04/06/2022 ?CLINICAL DATA:  Head trauma. Patient found on the floor. Abrasions along the left side of the face and under the left bottom left. EXAM: CT HEAD WITHOUT CONTRAST CT MAXILLOFACIAL WITHOUT CONTRAST CT CERVICAL SPINE WITHOUT CONTRAST TECHNIQUE: Multidetector CT imaging of the head, cervical spine, and maxillofacial  structures were performed using the standard protocol without intravenous contrast. Multiplanar CT image reconstructions of the cervical spine and maxillofacial structures were also generated. RADIATION DOSE REDUCTION: This exam was performed

## 2022-04-07 NOTE — Evaluation (Signed)
Occupational Therapy Evaluation ?Patient Details ?Name: Keith Pittman ?MRN: [id number] ?DOB: Feb 02, 1932 ?Today's Date: 04/07/2022 ? ? ?History of Present Illness 88 y/o male presented to ED on 04/06/22 after being found slumped over with abrasions of face/lip and incontinent. Found to have UTI and acute metabolic encephalopathy. PMH: hx of colon cancer, chronic SDH, mild memory problems, ongoing chronic alcohol use, diabetes  ? ?Clinical Impression ?  ?Pt admitted for concerns listed above. PTA pt reported that he was independent with all ADL's and IADL's, including driving. Due to pt cognition this session, unsure of accuracy of pt's prior level of functioning. At this time, he is requring min-mod A+2 for functional mobility and ADL's. Pt is limited by weakness, balance deficits, cognitive deficits, and decreased activity tolerance. Recommending SNF at this time, as pt needs to maximize his independence to ensure safety. OT will follow acutely.   ?   ? ?Recommendations for follow up therapy are one component of a multi-disciplinary discharge planning process, led by the attending physician.  Recommendations may be updated based on patient status, additional functional criteria and insurance authorization.  ? ?Follow Up Recommendations ? Skilled nursing-short term rehab (<3 hours/day)  ?  ?Assistance Recommended at Discharge Frequent or constant Supervision/Assistance  ?Patient can return home with the following Two people to help with walking and/or transfers;Two people to help with bathing/dressing/bathroom;Assistance with cooking/housework;Direct supervision/assist for medications management;Direct supervision/assist for financial management;Assist for transportation;Help with stairs or ramp for entrance ? ?  ?Functional Status Assessment ? Patient has had a recent decline in their functional status and demonstrates the ability to make significant improvements in function in a reasonable and predictable amount of  time.  ?Equipment Recommendations ? Other (comment) (TBD)  ?  ?Recommendations for Other Services   ? ? ?  ?Precautions / Restrictions Precautions ?Precautions: Fall ?Restrictions ?Weight Bearing Restrictions: No  ? ?  ? ?Mobility Bed Mobility ?Overal bed mobility: Needs Assistance ?Bed Mobility: Supine to Sit, Sit to Supine ?  ?  ?Supine to sit: Mod assist ?Sit to supine: Min assist ?  ?General bed mobility comments: modA for trunk elevation to sit EOB with patient reaching for therapist hand. MinA to return to supine with assist for LE management ?  ? ?Transfers ?Overall transfer level: Needs assistance ?Equipment used: None ?Transfers: Sit to/from Stand ?Sit to Stand: Min assist, +2 physical assistance, +2 safety/equipment ?  ?  ?  ?  ?  ?General transfer comment: minA+2 to stand from stretcher and patient immediately reaching out for something to grasp. ?  ? ?  ?Balance Overall balance assessment: History of Falls, Needs assistance ?Sitting-balance support: Bilateral upper extremity supported, Feet supported ?Sitting balance-Leahy Scale: Fair ?Sitting balance - Comments: requires some form of support to maintain balance ?Postural control: Posterior lean ?Standing balance support: Bilateral upper extremity supported, During functional activity ?Standing balance-Leahy Scale: Poor ?  ?  ?  ?  ?  ?  ?  ?  ?  ?  ?  ?  ?   ? ?ADL either performed or assessed with clinical judgement  ? ?ADL Overall ADL's : Needs assistance/impaired ?Eating/Feeding: Set up;Sitting ?  ?Grooming: Set up;Sitting ?  ?Upper Body Bathing: Minimal assistance;Sitting ?  ?Lower Body Bathing: Moderate assistance;+2 for safety/equipment;Sitting/lateral leans;Sit to/from stand ?  ?Upper Body Dressing : Minimal assistance;Sitting ?  ?Lower Body Dressing: Moderate assistance;+2 for safety/equipment;Sitting/lateral leans;Sit to/from stand ?  ?Toilet Transfer: Moderate assistance;+2 for safety/equipment;+2 for physical assistance;Stand-pivot ?   ?Toileting- Water quality scientist  and Hygiene: Moderate assistance;+2 for safety/equipment;Sitting/lateral lean;Sit to/from stand ?  ?  ?  ?Functional mobility during ADLs: Minimal assistance;+2 for safety/equipment;+2 for physical assistance;Rolling walker (2 wheels) ?General ADL Comments: Limited by weakness and decreased balance  ? ? ? ?Vision Baseline Vision/History: 1 Wears glasses ?Ability to See in Adequate Light: 0 Adequate ?Patient Visual Report: No change from baseline ?Vision Assessment?: Vision impaired- to be further tested in functional context ?Additional Comments: Pt not wanting to participate in vision assessment, irritated by questions despite education to necessity.  ?   ?Perception   ?  ?Praxis   ?  ? ?Pertinent Vitals/Pain Pain Assessment ?Pain Assessment: Faces ?Faces Pain Scale: No hurt ?Pain Intervention(s): Monitored during session  ? ? ? ?Hand Dominance Right ?  ?Extremity/Trunk Assessment Upper Extremity Assessment ?Upper Extremity Assessment: Generalized weakness ?  ?Lower Extremity Assessment ?Lower Extremity Assessment: Defer to PT evaluation ?RLE Deficits / Details: R LE 4/5 compared to L being 4+/5. R LE functionally weaker than L also ?  ?Cervical / Trunk Assessment ?Cervical / Trunk Assessment: Kyphotic ?  ?Communication Communication ?Communication: No difficulties ?  ?Cognition Arousal/Alertness: Awake/alert ?Behavior During Therapy: Agitated, Flat affect ?Overall Cognitive Status: No family/caregiver present to determine baseline cognitive functioning ?Area of Impairment: Orientation, Attention, Memory, Safety/judgement, Awareness, Problem solving ?  ?  ?  ?  ?  ?  ?  ?  ?Orientation Level: Disoriented to, Time, Situation ?Current Attention Level: Sustained ?Memory: Decreased short-term memory ?  ?Safety/Judgement: Decreased awareness of safety, Decreased awareness of deficits ?Awareness: Intellectual ?Problem Solving: Slow processing, Decreased initiation, Difficulty  sequencing, Requires verbal cues ?General Comments: patient stating "september" and becomes agitated with reorientation. Patient unable to state why he is here. Easily agitated with questions and commands. Poor awareness into current deficits and need for assistance ?  ?  ?General Comments  VSS on RA ? ?  ?Exercises   ?  ?Shoulder Instructions    ? ? ?Home Living Family/patient expects to be discharged to:: Skilled nursing facility ?  ?  ?  ?  ?  ?  ?  ?  ?  ?  ?  ?  ?  ?  ?  ?  ?Additional Comments: per chart review, patient lives alone and family watches him on camera. Per MD, unsafe discharge plan back to home alone ?  ? ?  ?Prior Functioning/Environment Prior Level of Function : Patient poor historian/Family not available ?  ?  ?  ?  ?  ?  ?Mobility Comments: patient reports not using AD to ambulate but unreliable historian due to impaired cognition ?ADLs Comments: Reports indep, however by pt appearance and per family in chart review he has not been  consistently completing self care tasks ?  ? ?  ?  ?OT Problem List: Decreased strength;Decreased range of motion;Decreased activity tolerance;Impaired balance (sitting and/or standing);Impaired vision/perception ?  ?   ?OT Treatment/Interventions: Self-care/ADL training;Therapeutic exercise;Energy conservation;DME and/or AE instruction;Therapeutic activities;Cognitive remediation/compensation;Patient/family education;Balance training  ?  ?OT Goals(Current goals can be found in the care plan section) Acute Rehab OT Goals ?Patient Stated Goal: To leave the hospital ?OT Goal Formulation: With patient ?Time For Goal Achievement: 04/21/22 ?Potential to Achieve Goals: Good ?ADL Goals ?Pt Will Perform Lower Body Bathing: with supervision;sitting/lateral leans;sit to/from stand ?Pt Will Perform Lower Body Dressing: with supervision;sitting/lateral leans;sit to/from stand ?Pt Will Transfer to Toilet: with min guard assist;stand pivot transfer ?Pt Will Perform Toileting -  Clothing Manipulation and hygiene: with supervision;sitting/lateral leans;sit to/from  stand ?Additional ADL Goal #1: Pt will follow 2 step commands to complete functional tasks, with no verbal cuing  ?OT Frequency: Min 2

## 2022-04-07 NOTE — Discharge Instructions (Signed)
                  Intensive Outpatient Programs  [facility]    [facility] [address]     [address] [place],  [place]     [place], [place] [phone]      [phone]  [facility]   [facility]  (Inpatient and outpatient)  [phone] (Suboxone and Methadone) [address]           [phone]           [facility]    [facility] - Intensive Outpatient [address]     [address] [place], [place] [address]     [place], [place]  [phone]      [phone]  [facility] (Outpatient, Inpatient, [facility]  [facility] (Groups and [facility]) (insurance only) [phone]    [place], [place]          [phone]       [facility]    [facility] (for caregivers and family) [address]     [address] [place], [place]     [place], [place] [phone]      [phone]  Residential Treatment Programs  [facility]  [facility](2 years) Residential: 90 days)  [facility] [address]      [address] [place], [place]     [place], [place] [phone]      [phone] or [phone]  [facility]    [facility] [address]      [address] [place], [place]     [place], [place] [phone]      [phone]  [facility]   [facility] ([facility] [address]     [address] [place], [place] [address]     [place], [place] [phone]      [phone] Admissions: 8am-3pm M-F  [facility]: Residential Program (90 Days)              [facility]: [facility]  [place], [place]     [place], [place]  [phone] or [phone]    (Walk in Hours over the weekend or by referral)   [facility]: [facility]:[phone] (for crisis  response 24 hours a day) 

## 2022-04-07 NOTE — ED Notes (Signed)
RN entered room to administer medication, noted pt had saturated bed w/ urine. NT assisted RN w/ changing linens and placing pt in a brief.  ?

## 2022-04-07 NOTE — ED Notes (Signed)
Pt placed on condom cath, agreeable to med administration. Pt.'s breakfast tray set up and pt is eating.  ?

## 2022-04-07 NOTE — Progress Notes (Signed)
NEW ADMISSION NOTE ?New Admission Note:  ? ?Arrival Method: E.D stretcher bed. ?Mental Orientation: Alert to self. ?Telemetry: None Ordered. ?Assessment: Completed Dry and scaly ,dark discoloration on ble shin. ?Skin:Assessed with Allyson ?LP:FXTKW A/C ?Pain:Denies ?Tubes:None. ?Safety Measures: Safety Fall Prevention Plan has been given, discussed and signed ?Admission: Completed ?[facility] Orientation: Patient has been orientated to the room, unit and staff.  ?Family: ? ?Orders have been reviewed and implemented. Will continue to monitor the patient. Call light has been placed within reach and bed alarm has been activated.  ? ?Rogelia Mire, RN   ?

## 2022-04-08 DIAGNOSIS — N390 Urinary tract infection, site not specified: Secondary | ICD-10-CM | POA: Diagnosis not present

## 2022-04-08 LAB — URINE CULTURE

## 2022-04-08 LAB — FOLATE: Folate: 11.4 ng/mL (ref >5.9)

## 2022-04-08 LAB — VITAMIN B12: Vitamin B-12: 218 pg/mL (ref 180–914)

## 2022-04-08 MED ORDER — DIVALPROEX SODIUM ER 250 MG PO TB24
250.0000 mg | ORAL_TABLET | Freq: Every day | ORAL | Status: DC
  Administered 2022-04-08 – 2022-04-10 (×3): 250 mg via ORAL
  Filled 2022-04-08 (×3): qty 1

## 2022-04-08 NOTE — TOC CAGE-AID Note (Signed)
Transition of Care (TOC) - CAGE-AID Screening ? ? ?Patient Details  ?Name: ELISE GLADDEN ?MRN: [id number] ?Date of Birth: [birth year] ? ?Transition of Care (TOC) CM/SW Contact:    ?Paulene Floor Sloane Junkin, LCSWA ?Phone Number: ?04/08/2022, 12:11 PM ? ? ?Clinical Narrative: ?CSW met with patient at bedside.  Patient currently oriented to person and place and unwilling to answer questions.  Patient was also being verbally abusive towards sitter that family provided to sit with patient during this encounter.  ? ? ?CAGE-AID Screening: ?Substance Abuse Screening unable to be completed due to: : Patient unable to participate ? ?  ?  ?  ?  ?  ? ?  ? ?  ? ? ? ? ? ? ?

## 2022-04-08 NOTE — TOC Initial Note (Signed)
Transition of Care (TOC) - Initial/Assessment Note  ? ? ?Patient Details  ?Name: Keith Pittman ?MRN: [id number] ?Date of Birth: 1932-05-08 ? ?Transition of Care (TOC) CM/SW Contact:    ?Keith Pittman, LCSWA ?Phone Number: ?04/08/2022, 11:37 AM ? ?Clinical Narrative:                 ?CSW received SNF consult and met with the patient at bedside.  Present was a sitter who the family sent to be with the patient during his inpatient stay.  CSW introduced self and spoke with the patient.  The patient was only oriented to person and place.  The patient reported that he is from home alone, has family who come to assist him, and that he is not going to a SNF.  The patient could not tell CSW why he was admitted and believes that he can care for himself.   ? ?The patient's sitter then began to speak and the patient begin to say repeatedly "I don't know her" loudly.  The sitter explained who she was and that the family asked her to be there.  The patient then asked numerous times "what time will Keith Pittman (patient's niece who assigned the sitter) be here?"  This is after the sitter informed patient several times that the niece would be at the hospital around noon.  The patient was also verbally abusive towards the sitter and calling the sitter derogatory names.  ? ?CSW then contacted the patient's sister/ HCPOA, Keith Pittman.  CSW explained rehabs recommendation and Keith Pittman is agreeable to the patient going to a SNF at discharge.  CSW explained the insurance process and will present the Medicare.com list.  The sister did not have a preference.   The sister reports that the patient has been to [place] and [facility] previously.  The sister also reports that the patient cannot care for himself at home and that she will not be able to assist as much as he previously did.  The sister has concerns for the patient's ability to make decisions. ? ?CSW contacted the doctor who agrees that the patient is not safe discharge home and  he is not making rational decisions for his safety and reported that per psych the patient has a POA and she will make decisions. ? ? ? ?Expected Discharge Plan: [place] ?Barriers to Discharge: SNF Pending bed offer, Unsafe home situation ? ? ?Patient Goals and CMS Choice ?Patient states their goals for this hospitalization and ongoing recovery are:: To go home.  Reports he can take care of himself ?CMS Medicare.gov Compare Post Acute Care list provided to:: Patient Represenative (must comment) ?Choice offered to / list presented to : Sibling ? ?Expected Discharge Plan and Services ?Expected Discharge Plan: [place] ?  ?  ?  ?Living arrangements for the past 2 months: [employer] ?                ?  ?  ?  ?  ?  ?  ?  ?  ?  ?  ? ?Prior Living Arrangements/Services ?Living arrangements for the past 2 months: [place] ?Lives with:: Self ?Patient language and need for interpreter reviewed:: Yes ?Do you feel safe going back to the place where you live?: Yes      ?Need for Family Participation in Patient Care: Yes (Comment) ?Care giver support system in place?: No (comment) ?  ?Criminal Activity/Legal Involvement Pertinent to Current Situation/Hospitalization: No -  Comment as needed ? ?Activities of Daily Living ?  ?  ? ?Permission Sought/Granted ?  ?  ?   ?   ?   ?   ? ?Emotional Assessment ?Appearance:: Appears older than stated age ?Attitude/Demeanor/Rapport: Aggressive (Verbally and/or physically), Irrational, Combative ?Affect (typically observed): Explosive, Angry, Agitated ?Orientation: : Oriented to Self, Oriented to Place ?Alcohol / Substance Use: Not Applicable ?Psych Involvement: No (comment) ? ?Admission diagnosis:  Encephalopathy [G93.40] ?Urinary tract infection without hematuria, site unspecified [N39.0] ?Acute metabolic encephalopathy [K35.24] ?Sepsis, due to unspecified organism, unspecified whether acute organ dysfunction present ([place]) [A41.9] ?Patient  Active Problem List  ? Diagnosis Date Noted  ? Encephalopathy 04/07/2022  ? Acute metabolic encephalopathy 81/85/9093  ? Hypothyroidism 04/06/2022  ? Altered mental status   ? Encounter for behavioral health screening 10/02/2019  ? Pressure injury of skin 09/29/2019  ? Lower urinary tract infectious disease 09/28/2019  ? Unwitnessed fall 02/02/2019  ? Rhabdomyolysis 02/02/2019  ? Elevated LFTs 02/02/2019  ? Alcohol use 02/02/2019  ? Subdural hematoma ([place]) 02/01/2019  ? ?PCP:  Keith Orn, MD ?Pharmacy:   ?[id number] [place] ?[address] ?[id number]?Phone: [phone]: [phone]? ? ? ? ?Social Determinants of Health (SDOH) Interventions ?  ? ?Readmission Risk Interventions ? ?  09/30/2019  ?  3:55 PM  ?Readmission Risk Prevention Plan  ?Post Dischage Appt Not Complete  ?Appt Comments plan for SNF  ?Medication Screening Complete  ?Transportation Screening Complete  ? ? ? ?

## 2022-04-08 NOTE — Progress Notes (Signed)
?PROGRESS NOTE ? ? ? ?Keith Pittman  XBD:[id number] DOB: [birth year] DOA: 04/06/2022 ?PCP: Lavone Orn, MD  ? ? ?Brief Narrative:  ?88 year old with history of colon cancer s/p surgery, multiple falls, chronic SDH and memory problems, brought to ER by EMS, he was found slumped over, abrasions of the face and lip and incontinent.  He lives alone.  Family called EMS as they did not see him on the camera.  Patient's mental status has been steadily worsening over the last few months and he has been declining to go to assisted living facility or get any help from the family.  In the emergency room, hemodynamically stable.  Urinalysis consistent with UTI.  Unsafe discharge. ? ? ?Assessment & Plan: ?  ?Acute metabolic encephalopathy in the context of developing dementia, frequent falls with cognitive decline: ?Acute UTI present on admission likely causing worsening cognition.  Urine culture with multiple species. ? ?Continue Rocephin today.  Multiple species growing.  We will change to oral antibiotics and treat for 10 days as complicated UTI. ?No evidence of postvoid residual. ?Seen by PT OT.  Recommended skilled nursing facility. ?TSH is 7, no indication to treat. ?S34 and folic acid levels are adequate. ?Refer to SNF. ? ?Cognitive deficits, early dementia:  ?Patient with some paranoia.  He is alert oriented to place and person but not time.  He is unsafe to discharge home.  He is not making any rational healthcare decisions. ?Patient's sister is healthcare power of attorney, she will have to make his healthcare decisions. ?Previously responded to Depakote, will start Depakote 250 mg at night. ?Patient may need long-term care/assisted living facility placement.  Family will coordinate with social worker to give them resources. ? ? ?DVT prophylaxis: enoxaparin (LOVENOX) injection 40 mg Start: 04/07/22 1000 ? ? ?Code Status: Full code ?Family Communication: Sister on the phone ?Disposition Plan: Status is: Inpatient.   Remains inpatient.  Not safe to go home alone. ? ? ?Consultants:  ?None ? ?Procedures:  ?None ? ?Antimicrobials:  ?Rocephin 5/13--- ? ? ?Subjective: ? ?Seen and examined.  Very annoyed by a sitter at the bedside that family sent to stay with him overnight.  He was really blaming on the sitter about all the problems he is having. ?Patient tells me that he will do what I recommend, however he will not listen to his sitter. ?Discussed in detail with patient's sister on the phone, recommended to SNF and ultimate plan that should look for assisted living facility. ? ?Discussed with psychiatry team, as patient has healthcare power of attorney to make healthcare decisions, no formal evaluation recommended. ? ?Objective: ?Vitals:  ? 04/07/22 2044 04/08/22 0521 04/08/22 0521 04/08/22 0859  ?BP: 129/64  119/75 131/73  ?Pulse: 84  73 62  ?Resp: '18  18 16  '$ ?Temp:  98.8 ?F (37.1 ?C)  (!) 97.2 ?F (36.2 ?C)  ?TempSrc:  Axillary  Oral  ?SpO2: 97%  97% 99%  ?Weight:      ?Height:      ? ? ?Intake/Output Summary (Last 24 hours) at 04/08/2022 1326 ?Last data filed at 04/08/2022 0800 ?Gross per 24 hour  ?Intake 0 ml  ?Output 200 ml  ?Net -200 ml  ? ?Filed Weights  ? 04/07/22 0808  ?Weight: 94.1 kg  ? ? ?Examination: ? ?General: Looks fairly comfortable.  Alert oriented x1-2.   ?Angry on conversation mostly towards his sitter. ?Denies any hallucinations or delusions, however he is impulsive and difficult to redirect at times. ?Cardiovascular: S1-S2 normal.  Regular rate rhythm. ?Respiratory: Bilateral clear.  No added sounds. ?Gastrointestinal: Soft.  Nontender.  Bowel sound present. ?Ext: No edema or cyanosis.  No swelling.  Poorly kempt and scaly skin. ?Neuro: Moves all extremities. ? ? ? ? ? ?Data Reviewed: I have personally reviewed following labs and imaging studies ? ?CBC: ?Recent Labs  ?Lab 04/02/22 ?1357 04/06/22 ?1731 04/07/22 ?0453  ?WBC 4.8 8.1 7.2  ?NEUTROABS  --  7.3  --   ?HGB 13.8 14.5 14.3  ?HCT 42.2 43.1 43.5  ?MCV 92.1  92.7 96.2  ?PLT 200 175 151  ? ?Basic Metabolic Panel: ?Recent Labs  ?Lab 04/02/22 ?1357 04/06/22 ?1731 04/07/22 ?0453  ?NA 139 136 137  ?K 4.4 3.8 4.1  ?CL 104 103 108  ?CO2 26 23 19*  ?GLUCOSE 110* 168* 111*  ?BUN '16 15 14  '$ ?CREATININE 0.95 1.12 0.92  ?CALCIUM 11.2* 10.4* 9.6  ? ?GFR: ?Estimated Creatinine Clearance: 66.3 mL/min (by C-G formula based on SCr of 0.92 mg/dL). ?Liver Function Tests: ?Recent Labs  ?Lab 04/02/22 ?1357 04/06/22 ?1731  ?AST 14* 24  ?ALT <5 9  ?ALKPHOS 70 69  ?BILITOT 0.5 1.5*  ?PROT 7.7 8.2*  ?ALBUMIN 4.3 4.1  ? ?No results for input(s): LIPASE, AMYLASE in the last 168 hours. ?No results for input(s): AMMONIA in the last 168 hours. ?Coagulation Profile: ?No results for input(s): INR, PROTIME in the last 168 hours. ?Cardiac Enzymes: ?Recent Labs  ?Lab 04/06/22 ?1731  ?CKTOTAL 566*  ? ?BNP (last 3 results) ?No results for input(s): PROBNP in the last 8760 hours. ?HbA1C: ?No results for input(s): HGBA1C in the last 72 hours. ?CBG: ?Recent Labs  ?Lab 04/02/22 ?1353 04/07/22 ?2325  ?GLUCAP 109* 98  ? ?Lipid Profile: ?No results for input(s): CHOL, HDL, LDLCALC, TRIG, CHOLHDL, LDLDIRECT in the last 72 hours. ?Thyroid Function Tests: ?Recent Labs  ?  04/07/22 ?0454  ?FREET4 0.86  ? ?Anemia Panel: ?Recent Labs  ?  04/08/22 ?0302  ?VITAMINB12 218  ?FOLATE 11.4  ? ?Sepsis Labs: ?Recent Labs  ?Lab 04/06/22 ?1731 04/07/22 ?0453  ?LATICACIDVEN 2.2* 2.3*  ? ? ?Recent Results (from the past 240 hour(s))  ?Resp Panel by RT-PCR (Flu A&B, Covid) Nasopharyngeal Swab     Status: None  ? Collection Time: 04/06/22  5:31 PM  ? Specimen: Nasopharyngeal Swab; Nasopharyngeal(NP) swabs in vial transport medium  ?Result Value Ref Range Status  ? SARS Coronavirus 2 by RT PCR NEGATIVE NEGATIVE Final  ?  Comment: (NOTE) ?SARS-CoV-2 target nucleic acids are NOT DETECTED. ? ?The SARS-CoV-2 RNA is generally detectable in upper respiratory ?specimens during the acute phase of infection. The lowest ?concentration of  SARS-CoV-2 viral copies this assay can detect is ?138 copies/mL. A negative result does not preclude SARS-Cov-2 ?infection and should not be used as the sole basis for treatment or ?other patient management decisions. A negative result may occur with  ?improper specimen collection/handling, submission of specimen other ?than nasopharyngeal swab, presence of viral mutation(s) within the ?areas targeted by this assay, and inadequate number of viral ?copies(<138 copies/mL). A negative result must be combined with ?clinical observations, patient history, and epidemiological ?information. The expected result is Negative. ? ?Fact Sheet for Patients:  ?EntrepreneurPulse.com.au ? ?Fact Sheet for Healthcare Providers:  ?IncredibleEmployment.be ? ?This test is no t yet approved or cleared by the [place] FDA and  ?has been authorized for detection and/or diagnosis of SARS-CoV-2 by ?FDA under an Emergency Use Authorization (EUA). This EUA will remain  ?in effect (meaning this  test can be used) for the duration of the ?COVID-19 declaration under Section 564(b)(1) of the Act, 21 ?U.S.C.section 360bbb-3(b)(1), unless the authorization is terminated  ?or revoked sooner.  ? ? ?  ? Influenza A by PCR NEGATIVE NEGATIVE Final  ? Influenza B by PCR NEGATIVE NEGATIVE Final  ?  Comment: (NOTE) ?The Xpert Xpress SARS-CoV-2/FLU/RSV plus assay is intended as an aid ?in the diagnosis of influenza from Nasopharyngeal swab specimens and ?should not be used as a sole basis for treatment. Nasal washings and ?aspirates are unacceptable for Xpert Xpress SARS-CoV-2/FLU/RSV ?testing. ? ?Fact Sheet for Patients: ?EntrepreneurPulse.com.au ? ?Fact Sheet for Healthcare Providers: ?IncredibleEmployment.be ? ?This test is not yet approved or cleared by the [place] FDA and ?has been authorized for detection and/or diagnosis of SARS-CoV-2 by ?FDA under an Emergency Use  Authorization (EUA). This EUA will remain ?in effect (meaning this test can be used) for the duration of the ?COVID-19 declaration under Section 564(b)(1) of the Act, 21 U.S.C. ?section 360bbb-3(b)(1), unless the Chief Strategy Officer

## 2022-04-08 NOTE — [place] FL2 (Signed)
?[place] MEDICAID FL2 LEVEL OF CARE SCREENING TOOL  ?  ? ?IDENTIFICATION  ?Patient Name: ?Keith Pittman Birthdate: August 17, 1932 Sex: male Admission Date (Current Location): ?04/06/2022  ?[place] and [place] Number: ? [place] ?  Facility and Address:  ?[facility], [place] [address] [place], [place] [address] ?     Provider Number: ?[id number]  ?Attending Physician Name and Address:  ?Barb Merino, MD ? Relative Name and Phone Number:  ?[name] (Sister)   [phone] [facility]) ?   ?Current Level of Care: ?Hospital Recommended Level of Care: ?[place] Prior Approval Number: ?  ? ?Date Approved/Denied: ?  PASRR Number: ?[id number] A ? ?Discharge Plan: ?SNF ?  ? ?Current Diagnoses: ?Patient Active Problem List  ? Diagnosis Date Noted  ? Encephalopathy 04/07/2022  ? Acute metabolic encephalopathy 83/38/2505  ? Hypothyroidism 04/06/2022  ? Altered mental status   ? Encounter for behavioral health screening 10/02/2019  ? Pressure injury of skin 09/29/2019  ? Lower urinary tract infectious disease 09/28/2019  ? Unwitnessed fall 02/02/2019  ? Rhabdomyolysis 02/02/2019  ? Elevated LFTs 02/02/2019  ? Alcohol use 02/02/2019  ? Subdural hematoma ([place]) 02/01/2019  ? ? ?Orientation RESPIRATION BLADDER Height & Weight   ?  ?Self, Place ? Normal Incontinent Weight: 207 lb 7.3 oz (94.1 kg) ?Height:  '5\' 11"'$  (180.3 cm)  ?BEHAVIORAL SYMPTOMS/MOOD NEUROLOGICAL BOWEL NUTRITION STATUS  ?Verbally abusive     Diet (see d/c summary)  ?AMBULATORY STATUS COMMUNICATION OF NEEDS Skin   ?Extensive Assist Verbally Normal ?  ?  ?  ?    ?     ?     ? ? ?Personal Care Assistance Level of Assistance  ?Bathing, Feeding, Dressing Bathing Assistance: Maximum assistance ?Feeding assistance: Limited assistance ?Dressing Assistance: Maximum assistance ?   ? ?Functional Limitations Info  ?Sight, Hearing, Speech Sight Info: Adequate ?Hearing Info: Adequate ?Speech Info: Adequate  ? ? ?SPECIAL CARE FACTORS FREQUENCY   ?PT (By licensed PT), OT (By licensed OT)   ?  ?PT Frequency: 5x/ week ?OT Frequency: 5x/ week ?  ?  ?  ?   ? ? ?Contractures Contractures Info: Not present  ? ? ?Additional Factors Info  ?Code Status, Allergies, Psychotropic Code Status Info: Full ?Allergies Info: Sulfa Antibiotics ?Psychotropic Info: divalproex (DEPAKOTE ER) 24 hr tablet 250 mg ?  ?  ?   ? ?Current Medications (04/08/2022):  This is the current hospital active medication list ?Current Facility-Administered Medications  ?Medication Dose Route Frequency Provider Last Rate Last Admin  ? acetaminophen (TYLENOL) tablet 650 mg  650 mg Oral Q6H PRN Etta Quill, DO      ? Or  ? acetaminophen (TYLENOL) suppository 650 mg  650 mg Rectal Q6H PRN Etta Quill, DO      ? cefTRIAXone (ROCEPHIN) 1 g in sodium chloride 0.9 % 100 mL IVPB  1 g Intravenous Q24H Jennette Kettle M, DO 200 mL/hr at 04/08/22 0625 1 g at 04/08/22 3976  ? divalproex (DEPAKOTE ER) 24 hr tablet 250 mg  250 mg Oral Daily Barb Merino, MD   250 mg at 04/08/22 1047  ? enoxaparin (LOVENOX) injection 40 mg  40 mg Subcutaneous Q24H Jennette Kettle M, DO   40 mg at 04/08/22 1042  ? folic acid (FOLVITE) tablet 1 mg  1 mg Oral Daily Alcario Drought, Jared M, DO   1 mg at 04/08/22 1042  ? multivitamin with minerals tablet 1 tablet  1 tablet Oral Daily Etta Quill, DO  1 tablet at 04/08/22 1042  ? ondansetron (ZOFRAN) tablet 4 mg  4 mg Oral Q6H PRN Etta Quill, DO      ? Or  ? ondansetron (ZOFRAN) injection 4 mg  4 mg Intravenous Q6H PRN Etta Quill, DO      ? thiamine tablet 100 mg  100 mg Oral Daily Jennette Kettle M, DO   100 mg at 04/08/22 1042  ? Or  ? thiamine (B-1) injection 100 mg  100 mg Intravenous Daily Jennette Kettle M, DO   100 mg at 04/06/22 2206  ? ? ? ?Discharge Medications: ?Please see discharge summary for a list of discharge medications. ? ?Relevant Imaging Results: ? ?Relevant Lab Results: ? ? ?Additional Information ?SS#: [phone]' 5'11" 207lbs ? ?Paulene Floor  Alister Staver, LCSWA ? ? ? ? ?

## 2022-04-09 DIAGNOSIS — N390 Urinary tract infection, site not specified: Secondary | ICD-10-CM | POA: Diagnosis not present

## 2022-04-09 NOTE — Progress Notes (Signed)
?PROGRESS NOTE ? ? ? ?Keith Pittman  MLY:[id number] DOB: 1931/12/23 DOA: 04/06/2022 ?PCP: Lavone Orn, MD  ? ? ?Brief Narrative:  ?88 year old with history of colon cancer s/p surgery, multiple falls, chronic SDH and memory problems, brought to ER by EMS, he was found slumped over, abrasions of the face and lip and incontinent.  He lives alone.  Family called EMS as they did not see him on the camera.  Patient's mental status has been steadily worsening over the last few months and he has been declining to go to assisted living facility or get any help from the family.  In the emergency room, hemodynamically stable.  Urinalysis consistent with UTI.  Unsafe discharge. ? ? ?Assessment & Plan: ?  ?Acute metabolic encephalopathy in the context of developing dementia, frequent falls with cognitive decline: ?Acute UTI present on admission likely causing worsening cognition.  Urine culture with multiple species. ? ?Continue Rocephin today.  Multiple species growing.  We will change to oral antibiotics and treat for 10 days as complicated UTI. ?No evidence of postvoid residual. ?Seen by PT OT.  Recommended skilled nursing facility. ?TSH is 7, no indication to treat. ?C12 and folic acid levels are adequate. ?Refer to SNF. ? ?Cognitive deficits, early dementia:  ?Patient with some paranoia.  He is alert oriented to place and person but not time.  He is unsafe to discharge home.  He is not making any rational healthcare decisions. ?Patient's sister is healthcare power of attorney, she will have to make his healthcare decisions. ?Previously responded to Depakote, will start Depakote 250 mg at night. ?Patient may need long-term care/assisted living facility placement.  Family will coordinate with social worker to give them resources. ? ? ?DVT prophylaxis: enoxaparin (LOVENOX) injection 40 mg Start: 04/07/22 1000 ? ? ?Code Status: Full code ?Family Communication: None. ?Disposition Plan: Status is: Inpatient.  Remains  inpatient.  Not safe to go home alone.  Stable to discharge to SNF. ? ? ?Consultants:  ?None ? ?Procedures:  ?None ? ?Antimicrobials:  ?Rocephin 5/13--- ? ? ?Subjective: ? ?Seen and examined.  No overnight events.  Just unhappy.  Wants to go home. ? ?Discussed with psychiatry team, as patient has healthcare power of attorney to make healthcare decisions, no formal evaluation recommended. ? ?Objective: ?Vitals:  ? 04/08/22 1721 04/08/22 2137 04/09/22 0445 04/09/22 0851  ?BP: 110/62 131/75 138/73 (!) 114/56  ?Pulse: 68 67 69 66  ?Resp: '16 15 18 16  '$ ?Temp: 97.9 ?F (36.6 ?C) 99.3 ?F (37.4 ?C) 98.4 ?F (36.9 ?C) 98.1 ?F (36.7 ?C)  ?TempSrc:    Oral  ?SpO2: 97% 99% 99% 98%  ?Weight:      ?Height:      ? ? ?Intake/Output Summary (Last 24 hours) at 04/09/2022 1402 ?Last data filed at 04/09/2022 1300 ?Gross per 24 hour  ?Intake 460 ml  ?Output 950 ml  ?Net -490 ml  ? ?Filed Weights  ? 04/07/22 0808  ?Weight: 94.1 kg  ? ? ?Examination: ? ?General: On room air.  Chronically debilitated but not in any distress. ?He does have some ecchymosis on the right side of the face.  No active bleeding. ?Cardiovascular: S1-S2 normal.  Regular rate rhythm. ?Respiratory: Bilateral clear.  No added sounds. ?Gastrointestinal: Soft.  Nontender. ?Ext: No edema.  Unkempt and scaly skin. ?Neuro: Alert awake.  Oriented x2-3.  Mostly annoyed by conversation.  Fixated on going home. ?Musculoskeletal: No deformities. ?Skin: Intact. ? ? ? ? ? ?Data Reviewed: I have personally reviewed following  labs and imaging studies ? ?CBC: ?Recent Labs  ?Lab 04/06/22 ?1731 04/07/22 ?0453  ?WBC 8.1 7.2  ?NEUTROABS 7.3  --   ?HGB 14.5 14.3  ?HCT 43.1 43.5  ?MCV 92.7 96.2  ?PLT 175 151  ? ?Basic Metabolic Panel: ?Recent Labs  ?Lab 04/06/22 ?1731 04/07/22 ?0453  ?NA 136 137  ?K 3.8 4.1  ?CL 103 108  ?CO2 23 19*  ?GLUCOSE 168* 111*  ?BUN 15 14  ?CREATININE 1.12 0.92  ?CALCIUM 10.4* 9.6  ? ?GFR: ?Estimated Creatinine Clearance: 66.3 mL/min (by C-G formula based on SCr of  0.92 mg/dL). ?Liver Function Tests: ?Recent Labs  ?Lab 04/06/22 ?1731  ?AST 24  ?ALT 9  ?ALKPHOS 69  ?BILITOT 1.5*  ?PROT 8.2*  ?ALBUMIN 4.1  ? ?No results for input(s): LIPASE, AMYLASE in the last 168 hours. ?No results for input(s): AMMONIA in the last 168 hours. ?Coagulation Profile: ?No results for input(s): INR, PROTIME in the last 168 hours. ?Cardiac Enzymes: ?Recent Labs  ?Lab 04/06/22 ?1731  ?CKTOTAL 566*  ? ?BNP (last 3 results) ?No results for input(s): PROBNP in the last 8760 hours. ?HbA1C: ?No results for input(s): HGBA1C in the last 72 hours. ?CBG: ?Recent Labs  ?Lab 04/07/22 ?2325  ?GLUCAP 98  ? ?Lipid Profile: ?No results for input(s): CHOL, HDL, LDLCALC, TRIG, CHOLHDL, LDLDIRECT in the last 72 hours. ?Thyroid Function Tests: ?Recent Labs  ?  04/07/22 ?0454  ?FREET4 0.86  ? ?Anemia Panel: ?Recent Labs  ?  04/08/22 ?0302  ?VITAMINB12 218  ?FOLATE 11.4  ? ?Sepsis Labs: ?Recent Labs  ?Lab 04/06/22 ?1731 04/07/22 ?0453  ?LATICACIDVEN 2.2* 2.3*  ? ? ?Recent Results (from the past 240 hour(s))  ?Resp Panel by RT-PCR (Flu A&B, Covid) Nasopharyngeal Swab     Status: None  ? Collection Time: 04/06/22  5:31 PM  ? Specimen: Nasopharyngeal Swab; Nasopharyngeal(NP) swabs in vial transport medium  ?Result Value Ref Range Status  ? SARS Coronavirus 2 by RT PCR NEGATIVE NEGATIVE Final  ?  Comment: (NOTE) ?SARS-CoV-2 target nucleic acids are NOT DETECTED. ? ?The SARS-CoV-2 RNA is generally detectable in upper respiratory ?specimens during the acute phase of infection. The lowest ?concentration of SARS-CoV-2 viral copies this assay can detect is ?138 copies/mL. A negative result does not preclude SARS-Cov-2 ?infection and should not be used as the sole basis for treatment or ?other patient management decisions. A negative result may occur with  ?improper specimen collection/handling, submission of specimen other ?than nasopharyngeal swab, presence of viral mutation(s) within the ?areas targeted by this assay, and  inadequate number of viral ?copies(<138 copies/mL). A negative result must be combined with ?clinical observations, patient history, and epidemiological ?information. The expected result is Negative. ? ?Fact Sheet for Patients:  ?EntrepreneurPulse.com.au ? ?Fact Sheet for Healthcare Providers:  ?IncredibleEmployment.be ? ?This test is no t yet approved or cleared by the [place] FDA and  ?has been authorized for detection and/or diagnosis of SARS-CoV-2 by ?FDA under an Emergency Use Authorization (EUA). This EUA will remain  ?in effect (meaning this test can be used) for the duration of the ?COVID-19 declaration under Section 564(b)(1) of the Act, 21 ?U.S.C.section 360bbb-3(b)(1), unless the authorization is terminated  ?or revoked sooner.  ? ? ?  ? Influenza A by PCR NEGATIVE NEGATIVE Final  ? Influenza B by PCR NEGATIVE NEGATIVE Final  ?  Comment: (NOTE) ?The Xpert Xpress SARS-CoV-2/FLU/RSV plus assay is intended as an aid ?in the diagnosis of influenza from Nasopharyngeal swab specimens and ?should not  be used as a sole basis for treatment. Nasal washings and ?aspirates are unacceptable for Xpert Xpress SARS-CoV-2/FLU/RSV ?testing. ? ?Fact Sheet for Patients: ?EntrepreneurPulse.com.au ? ?Fact Sheet for Healthcare Providers: ?IncredibleEmployment.be ? ?This test is not yet approved or cleared by the [place] FDA and ?has been authorized for detection and/or diagnosis of SARS-CoV-2 by ?FDA under an Emergency Use Authorization (EUA). This EUA will remain ?in effect (meaning this test can be used) for the duration of the ?COVID-19 declaration under Section 564(b)(1) of the Act, 21 U.S.C. ?section 360bbb-3(b)(1), unless the authorization is terminated or ?revoked. ? ?Performed at [facility], [place] [address] [place], [place] ?[address] ?  ?Blood Culture (routine x 2)     Status: None (Preliminary result)  ? Collection Time: 04/06/22   5:31 PM  ? Specimen: BLOOD  ?Result Value Ref Range Status  ? Specimen Description BLOOD RIGHT ANTECUBITAL  Final  ? Special Requests   Final  ?  BOTTLES DRAWN AEROBIC AND ANAEROBIC Blood Culture adequate volume  ?

## 2022-04-09 NOTE — Progress Notes (Signed)
Physical Therapy Treatment ?Patient Details ?Name: Keith Pittman ?MRN: [id number] ?DOB: 10-Oct-1932 ?Today's Date: 04/09/2022 ? ? ?History of Present Illness 88 y/o male presented to ED on 04/06/22 after being found slumped over with abrasions of face/lip and incontinent. Found to have UTI and acute metabolic encephalopathy. PMH: hx of colon cancer, chronic SDH, mild memory problems, ongoing chronic alcohol use, diabetes ? ?  ?PT Comments  ? ? Continuing work on functional mobility and activity tolerance;  Session focused on functional transfers and further assessment of standing balance and tolerance; Making progress compared to last session, with no overt buckling noted with brief standing activity; plan for progressive amb with chair follow next session   ?Recommendations for follow up therapy are one component of a multi-disciplinary discharge planning process, led by the attending physician.  Recommendations may be updated based on patient status, additional functional criteria and insurance authorization. ? ?Follow Up Recommendations ? Skilled nursing-short term rehab (<3 hours/day) ?  ?  ?Assistance Recommended at Discharge Frequent or constant Supervision/Assistance  ?Patient can return home with the following A lot of help with walking and/or transfers;A lot of help with bathing/dressing/bathroom;Assistance with cooking/housework;Direct supervision/assist for medications management;Direct supervision/assist for financial management;Assist for transportation;Help with stairs or ramp for entrance ?  ?Equipment Recommendations ? Rolling walker (2 wheels);BSC/3in1  ?  ?Recommendations for Other Services   ? ? ?  ?Precautions / Restrictions Precautions ?Precautions: Fall  ?  ? ?Mobility ? Bed Mobility ?Overal bed mobility: Needs Assistance ?Bed Mobility: Supine to Sit ?  ?  ?Supine to sit: Mod assist ?  ?  ?General bed mobility comments: Light mod handheld assist to pull to sit; cues to scoot forward and get feet  to teh floor ?  ? ?Transfers ?Overall transfer level: Needs assistance ?Equipment used: Rolling walker (2 wheels) ?Transfers: Sit to/from Stand, Bed to chair/wheelchair/BSC ?Sit to Stand: Min assist, +2 physical assistance, +2 safety/equipment ?  ?Step pivot transfers: +2 physical assistance, Mod assist ?  ?  ?  ?General transfer comment: Min assist to stand from EOB to RW; dependent on UEs to push up and tehn push up to full stand with heavy support from RW; Took pivotal steps bed to recliner with mild but persistent posterior lean and tendency to brace LEs against bed for stability ?  ? ?Ambulation/Gait ?  ?  ?  ?  ?  ?  ?  ?  ? ? ?Stairs ?  ?  ?  ?  ?  ? ? ?Wheelchair Mobility ?  ? ?Modified Rankin (Stroke Patients Only) ?  ? ? ?  ?Balance Overall balance assessment: History of Falls, Needs assistance ?Sitting-balance support: Bilateral upper extremity supported, Feet supported ?Sitting balance-Leahy Scale: Fair ?Sitting balance - Comments: requires some form of support to maintain balance ?Postural control: Posterior lean ?Standing balance support: Bilateral upper extremity supported, During functional activity ?Standing balance-Leahy Scale: Poor ?  ?  ?  ?  ?  ?  ?  ?  ?  ?  ?  ?  ?  ? ?  ?Cognition Arousal/Alertness: Awake/alert ?Behavior During Therapy: Flat affect ?Overall Cognitive Status: No family/caregiver present to determine baseline cognitive functioning ?Area of Impairment: Orientation, Attention, Memory, Safety/judgement, Awareness, Problem solving ?  ?  ?  ?  ?  ?  ?  ?  ?Orientation Level: Disoriented to, Time, Situation ?  ?  ?  ?  ?  ?  ?General Comments: Per chart review, easily agitated with questions, so  opted to avoid many questions; Pt became more engaged when talking about his time and travel in [employer] ?  ?  ? ?  ?Exercises   ? ?  ?General Comments   ?  ?  ? ?Pertinent Vitals/Pain Pain Assessment ?Pain Assessment: Faces ?Faces Pain Scale: No hurt ?Pain Intervention(s): Monitored  during session  ? ? ?Home Living   ?  ?  ?  ?  ?  ?  ?  ?  ?  ?   ?  ?Prior Function    ?  ?  ?   ? ?PT Goals (current goals can now be found in the care plan section) Acute Rehab PT Goals ?Patient Stated Goal: did not state; "I'll do whatever you want, lady" ?PT Goal Formulation: Patient unable to participate in goal setting ?Time For Goal Achievement: 04/21/22 ?Potential to Achieve Goals: Fair ?Progress towards PT goals: Progressing toward goals ? ?  ?Frequency ? ? ? Min 2X/week ? ? ? ?  ?PT Plan Current plan remains appropriate  ? ? ?Co-evaluation   ?  ?  ?  ?  ? ?  ?AM-PAC PT "6 Clicks" Mobility   ?Outcome Measure ? Help needed turning from your back to your side while in a flat bed without using bedrails?: A Little ?Help needed moving from lying on your back to sitting on the side of a flat bed without using bedrails?: A Lot ?Help needed moving to and from a bed to a chair (including a wheelchair)?: Total ?Help needed standing up from a chair using your arms (e.g., wheelchair or bedside chair)?: Total ?Help needed to walk in hospital room?: Total ?Help needed climbing 3-5 steps with a railing? : Total ?6 Click Score: 9 ? ?  ?End of Session Equipment Utilized During Treatment: Gait belt ?Activity Tolerance: Patient tolerated treatment well ?Patient left: in chair;with call bell/phone within reach;with chair alarm set ?Nurse Communication: Mobility status ?PT Visit Diagnosis: Unsteadiness on feet (R26.81);Muscle weakness (generalized) (M62.81);History of falling (Z91.81);Difficulty in walking, not elsewhere classified (R26.2) ?  ? ? ?Time: 1201-1224 ?PT Time Calculation (min) (ACUTE ONLY): 23 min ? ?Charges:  $Gait Training: 8-22 mins ?$Therapeutic Activity: 8-22 mins          ?          ? ?Roney Marion, PT  ?Acute Rehabilitation Services ?Office [phone]' ? ? ? ?Colletta [place] ?04/09/2022, 3:37 PM ? ?

## 2022-04-09 NOTE — TOC Progression Note (Signed)
Transition of Care (TOC) - Initial/Assessment Note  ? ? ?Patient Details  ?Name: Keith Pittman ?MRN: [id number] ?Date of Birth: August 12, 1932 ? ?Transition of Care (TOC) CM/SW Contact:    ?Paulene Floor Conley Delisle, LCSWA ?Phone Number: ?04/09/2022, 11:52 AM ? ?Clinical Narrative:                 ?CSW contacted the patient's sister/ HCPOA and presented bed offers.  [facility] was chosen.  The facility can accept the patient tomorrow.   ? ?Expected Discharge Plan: [place] ?Barriers to Discharge: SNF Pending bed offer, Unsafe home situation ? ? ?Patient Goals and CMS Choice ?Patient states their goals for this hospitalization and ongoing recovery are:: To go home.  Reports he can take care of himself ?CMS Medicare.gov Compare Post Acute Care list provided to:: Patient Represenative (must comment) ?Choice offered to / list presented to : Sibling ? ?Expected Discharge Plan and Services ?Expected Discharge Plan: [place] ?  ?  ?  ?Living arrangements for the past 2 months: [place] ?                ?  ?  ?  ?  ?  ?  ?  ?  ?  ?  ? ?Prior Living Arrangements/Services ?Living arrangements for the past 2 months: [place] ?Lives with:: Self ?Patient language and need for interpreter reviewed:: Yes ?Do you feel safe going back to the place where you live?: Yes      ?Need for Family Participation in Patient Care: Yes (Comment) ?Care giver support system in place?: No (comment) ?  ?Criminal Activity/Legal Involvement Pertinent to Current Situation/Hospitalization: No - Comment as needed ? ?Activities of Daily Living ?  ?  ? ?Permission Sought/Granted ?  ?  ?   ?   ?   ?   ? ?Emotional Assessment ?Appearance:: Appears older than stated age ?Attitude/Demeanor/Rapport: Aggressive (Verbally and/or physically), Irrational, Combative ?Affect (typically observed): Explosive, Angry, Agitated ?Orientation: : Oriented to Self, Oriented to Place ?Alcohol / Substance Use: Not Applicable ?Psych  Involvement: No (comment) ? ?Admission diagnosis:  Encephalopathy [G93.40] ?Urinary tract infection without hematuria, site unspecified [N39.0] ?Acute metabolic encephalopathy [O87.86] ?Sepsis, due to unspecified organism, unspecified whether acute organ dysfunction present ([place]) [A41.9] ?Patient Active Problem List  ? Diagnosis Date Noted  ? Encephalopathy 04/07/2022  ? Acute metabolic encephalopathy 76/72/0947  ? Hypothyroidism 04/06/2022  ? Altered mental status   ? Encounter for behavioral health screening 10/02/2019  ? Pressure injury of skin 09/29/2019  ? Lower urinary tract infectious disease 09/28/2019  ? Unwitnessed fall 02/02/2019  ? Rhabdomyolysis 02/02/2019  ? Elevated LFTs 02/02/2019  ? Alcohol use 02/02/2019  ? Subdural hematoma ([place]) 02/01/2019  ? ?PCP:  Lavone Orn, MD ?Pharmacy:   ?[id number] [place] [facility] [id number]?Phone: [phone]: [phone]? ? ? ? ?Social Determinants of Health (SDOH) Interventions ?  ? ?Readmission Risk Interventions ? ?  09/30/2019  ?  3:55 PM  ?Readmission Risk Prevention Plan  ?Post Dischage Appt Not Complete  ?Appt Comments plan for SNF  ?Medication Screening Complete  ?Transportation Screening Complete  ? ? ? ?

## 2022-04-10 DIAGNOSIS — N390 Urinary tract infection, site not specified: Secondary | ICD-10-CM | POA: Diagnosis not present

## 2022-04-10 LAB — VITAMIN B1: Vitamin B1 (Thiamine): 135.5 nmol/L (ref 66.5–200.0)

## 2022-04-10 MED ORDER — THIAMINE HCL 100 MG PO TABS: 100.0000 mg | ORAL_TABLET | Freq: Every day | ORAL | 0 refills | Status: AC

## 2022-04-10 MED ORDER — ADULT MULTIVITAMIN W/MINERALS CH
1.0000 | ORAL_TABLET | Freq: Every day | ORAL | 0 refills | Status: AC
Start: 2022-04-11 — End: 2022-05-11

## 2022-04-10 MED ORDER — FOLIC ACID 1 MG PO TABS
1.0000 mg | ORAL_TABLET | Freq: Every day | ORAL | 0 refills | Status: AC
Start: 2022-04-11 — End: 2022-05-11

## 2022-04-10 NOTE — Hospital Course (Signed)
88 year old with history of colon cancer s/p surgery, multiple falls, chronic SDH and memory problems, brought to ER by EMS, he was found slumped over, abrasions of the face and lip and incontinent.  He lives alone.  Family called EMS as they did not see him on the camera.  Patient's mental status has been steadily worsening over the last few months and he has been declining to go to assisted living facility or get any help from the family.  In the emergency room, hemodynamically stable.  Urinalysis consistent with UTI.  Unsafe discharge.  She was admitted for further management, treated for acute metabolic encephalopathy in the context of bed with dementia frequent falls cognitive decline, and UTI.  At this time remains stable, seen by PT OT planning for discharge to skilled nursing facility, patient's sister has been making decision. ?

## 2022-04-10 NOTE — TOC Transition Note (Signed)
Transition of Care (TOC) - CM/SW Discharge Note ? ? ?Patient Details  ?Name: Keith Pittman ?MRN: [id number] ?Date of Birth: 1932-09-10 ? ?Transition of Care (TOC) CM/SW Contact:  ?Paulene Floor Emiel Kielty, LCSWA ?Phone Number: ?04/10/2022, 1:59 PM ? ? ?Clinical Narrative:    ?Patient will DC to:  [facility] ?Anticipated DC date:  04/10/2022 ?Family notified:  Yes ?Transport by: Corey Harold ? ? ?Per MD patient ready for DC to SNF. RN to call report prior to discharge [phone] room 601A. RN, patient, patient's family, and facility notified of DC. Discharge Summary and FL2 sent to facility. DC packet on chart. Ambulance transport requested for patient.  ? ?CSW will sign off for now as social work intervention is no longer needed. Please consult [place] again if new needs arise. ?  ? ? ?Final next level of care: [place] ?Barriers to Discharge: Barriers Resolved ? ? ?Patient Goals and CMS Choice ?Patient states their goals for this hospitalization and ongoing recovery are:: To go home.  Reports he can take care of himself ?CMS Medicare.gov Compare Post Acute Care list provided to:: Patient Represenative (must comment) ?Choice offered to / list presented to : Sibling ? ?Discharge Placement ?  ?           ?[facility] ?Patient to be transferred to facility by: [facility] ?Name of family member notified: [name] (Sister)   [phone] ?Patient and family notified of of transfer: 04/10/22 ? ?Discharge Plan and Services ?  ?  ?           ?  ?  ?  ?  ?  ?  ?  ?  ?  ?  ? ?Social Determinants of Health (SDOH) Interventions ?  ? ? ?Readmission Risk Interventions ? ?  09/30/2019  ?  3:55 PM  ?Readmission Risk Prevention Plan  ?Post Dischage Appt Not Complete  ?Appt Comments plan for SNF  ?Medication Screening Complete  ?Transportation Screening Complete  ? ? ? ? ? ?

## 2022-04-10 NOTE — TOC Progression Note (Addendum)
Transition of Care (TOC) - Initial/Assessment Note  ? ? ?Patient Details  ?Name: Keith Pittman ?MRN: [id number] ?Date of Birth: 03-Jul-1932 ? ?Transition of Care (TOC) CM/SW Contact:    ?Paulene Floor Rehan Holness, LCSWA ?Phone Number: ?04/10/2022, 9:07 AM ? ?Clinical Narrative:                 ?CSW contacted the facility to request transfer information and is awaiting a response.  MD notified. ? ?10:00-  CSW received  transfer information from [facility].  Spoke with patient at bedside.  Patient is only oriented to person and place.  Patient has paranoia and is fixated on a "woman who is at my (patient's) home eating my (patient's) food and living for free".  CSW gathered that the patient is referring to the nurse aides that come to assist the patient.  The patient is adamant about going home and repeats the same thing over and over "rehab? I have my own damn house".  When CSW attempts to explain why the patient needs rehab the patient reports that he does not need rehab although he does not remember falling and reports that he can walk on his own. ? ?CSW contacted the patient's sister and HCPOA who is still in agreement with the patient going to SNF.  The sister will come to speak with patient at 81. ? ?CSW contacted [facility] leadership, and confirmed that with the patient's Cognitive deficits (early dementia) and MD note documenting that the patient is  not making any rational healthcare decisions, the patient can d/c to SNF with the patient's sister/ HCPOA agreement.  MD notified.  ? ?TOC will continue to follow.  ? ?Expected Discharge Plan: [place] ?Barriers to Discharge: SNF Pending bed offer, Unsafe home situation ? ? ?Patient Goals and CMS Choice ?Patient states their goals for this hospitalization and ongoing recovery are:: To go home.  Reports he can take care of himself ?CMS Medicare.gov Compare Post Acute Care list provided to:: Patient Represenative (must comment) ?Choice offered to / list  presented to : Sibling ? ?Expected Discharge Plan and Services ?Expected Discharge Plan: [place] ?  ?  ?  ?Living arrangements for the past 2 months: [place] ?                ?  ?  ?  ?  ?  ?  ?  ?  ?  ?  ? ?Prior Living Arrangements/Services ?Living arrangements for the past 2 months: [place] ?Lives with:: Self ?Patient language and need for interpreter reviewed:: Yes ?Do you feel safe going back to the place where you live?: Yes      ?Need for Family Participation in Patient Care: Yes (Comment) ?Care giver support system in place?: No (comment) ?  ?Criminal Activity/Legal Involvement Pertinent to Current Situation/Hospitalization: No - Comment as needed ? ?Activities of Daily Living ?  ?  ? ?Permission Sought/Granted ?  ?  ?   ?   ?   ?   ? ?Emotional Assessment ?Appearance:: Appears older than stated age ?Attitude/Demeanor/Rapport: Aggressive (Verbally and/or physically), Irrational, Combative ?Affect (typically observed): Explosive, Angry, Agitated ?Orientation: : Oriented to Self, Oriented to Place ?Alcohol / Substance Use: Not Applicable ?Psych Involvement: No (comment) ? ?Admission diagnosis:  Encephalopathy [G93.40] ?Urinary tract infection without hematuria, site unspecified [N39.0] ?Acute metabolic encephalopathy [R51.88] ?Sepsis, due to unspecified organism, unspecified whether acute organ dysfunction present ([place]) [A41.9] ?Patient Active Problem List  ? Diagnosis Date  Noted  ? Encephalopathy 04/07/2022  ? Acute metabolic encephalopathy 22/44/9753  ? Hypothyroidism 04/06/2022  ? Altered mental status   ? Encounter for behavioral health screening 10/02/2019  ? Pressure injury of skin 09/29/2019  ? Lower urinary tract infectious disease 09/28/2019  ? Unwitnessed fall 02/02/2019  ? Rhabdomyolysis 02/02/2019  ? Elevated LFTs 02/02/2019  ? Alcohol use 02/02/2019  ? Subdural hematoma ([place]) 02/01/2019  ? ?PCP:  Lavone Orn, MD ?Pharmacy:   ?[id number]  [place] [facility] [id number]?Phone: [phone]: [phone]? ? ? ? ?Social Determinants of Health (SDOH) Interventions ?  ? ?Readmission Risk Interventions ? ?  09/30/2019  ?  3:55 PM  ?Readmission Risk Prevention Plan  ?Post Dischage Appt Not Complete  ?Appt Comments plan for SNF  ?Medication Screening Complete  ?Transportation Screening Complete  ? ? ? ?

## 2022-04-10 NOTE — Discharge Summary (Signed)
Physician Discharge Summary  ?Keith Pittman:[id number] DOB: 03/10/1932 DOA: 04/06/2022 ? ?PCP: Lavone Orn, MD ? ?Admit date: 04/06/2022 ?Discharge date: 04/10/2022 ?Recommendations for Outpatient Follow-up:  ?Follow up with PCP in 1 weeks-call for appointment ?Please obtain BMP/CBC in one week ? ?Discharge Dispo: SNF ?Discharge Condition: Stable ?Code Status:   Code Status: Full Code ?Diet recommendation:  ?Diet Order   ? ?       ?  Diet regular Room service appropriate? Yes; Fluid consistency: Thin  Diet effective now       ?  ? ?  ?  ? ?  ?  ? ?Brief/Interim Summary: ?88 year old with history of colon cancer s/p surgery, multiple falls, chronic SDH and memory problems, brought to ER by EMS, he was found slumped over, abrasions of the face and lip and incontinent.  He lives alone.  Family called EMS as they did not see him on the camera.  Patient's mental status has been steadily worsening over the last few months and he has been declining to go to assisted living facility or get any help from the family.  In the emergency room, hemodynamically stable.  Urinalysis consistent with UTI.  Unsafe discharge.  She was admitted for further management, treated for acute metabolic encephalopathy in the context of bed with dementia frequent falls cognitive decline, and UTI.  At this time remains stable, seen by PT OT planning for discharge to skilled nursing facility, patient's sister has been making decision.  ? ?Discharge Diagnoses:  ?Principal Problem: ?  Lower urinary tract infectious disease ?Active Problems: ?  Alcohol use ?  Acute metabolic encephalopathy ?  Hypothyroidism ?  Encephalopathy ? ? ?Lower urinary tract infectious disease: Urine culture mixed organism, treated with Rocephin x 3 days ? ?Hypothyroidism:TSH mildly elevated.  No indication for treatment. ? ?Acute metabolic encephalopathy in the context of developing dementia/frequent falls with cognitive decline: ?With some paranoid behavior ?Cognitive  deficits, early dementia: ?Patient is alert awake able to tell his name current location but unaware about date current president current situation.  He is not making rational healthcare decision and patient's sister has been helping with his healthcare decisions, she is a healthcare power of attorney primacy discharge the patient to go to skilled nursing facility at times patient has been reluctant.  He has been reluctant to get any help or got ALF  pta ?He has been living home alone at baseline but not taking care of self.   Per report family has been trying to get him to go to ALF / SNF for 3 years now it looks like (he doesn't want to go).  Continue Depakote folate thiamine ? ?Alcohol use: No signs of withdrawal ? ? ?Consults: ?TOC ?Subjective: ?Alert and oriented to self, current place but when asked about it he does not answer, unable to tell current president  gets easily angry upset while trying to have conversation. ? ?Discharge Exam: ?Vitals:  ? 04/10/22 0855 04/10/22 0858  ?BP:  (!) 122/59  ?Pulse: 74 68  ?Resp: 16   ?Temp:  98.1 ?F (36.7 ?C)  ?SpO2: 97% 99%  ? ?General: Pt is alert, awake, not in acute distress ?Cardiovascular: RRR, S1/S2 +, no rubs, no gallops ?Respiratory: CTA bilaterally, no wheezing, no rhonchi ?Abdominal: Soft, NT, ND, bowel sounds + ?Extremities: no edema, no cyanosis ? ?Discharge Instructions ? ?Discharge Instructions   ? ? Discharge instructions   Complete by: As directed ?  ? Please call call MD or return to ER  for similar or worsening recurring problem that brought you to hospital or if any fever,nausea/vomiting,abdominal pain, uncontrolled pain, chest pain,  shortness of breath or any other alarming symptoms. ? ?Please follow-up your doctor as instructed in a week time and call the office for appointment. ? ?Please avoid alcohol, smoking, or any other illicit substance and maintain healthy habits including taking your regular medications as prescribed. ? ?You were cared for by  a hospitalist during your hospital stay. If you have any questions about your discharge medications or the care you received while you were in the hospital after you are discharged, you can call the unit and ask to speak with the hospitalist on call if the hospitalist that took care of you is not available. ? ?Once you are discharged, your primary care physician will handle any further medical issues. Please note that NO REFILLS for any discharge medications will be authorized once you are discharged, as it is imperative that you return to your primary care physician (or establish a relationship with a primary care physician if you do not have one) for your aftercare needs so that they can reassess your need for medications and monitor your lab values  ? Increase activity slowly   Complete by: As directed ?  ? ?  ? ?Allergies as of 04/10/2022   ? ?   Reactions  ? Sulfa Antibiotics Other (See Comments)  ? Sulfa-containing drugs (reaction??- occurred in childhood) [facility] has this listed, but no known reaction  ? ?  ? ?  ?Medication List  ?  ? ?TAKE these medications   ? ?divalproex 250 MG DR tablet ?Commonly known as: DEPAKOTE ?Take 1 tablet (250 mg total) by mouth at bedtime. ?  ?folic acid 1 MG tablet ?Commonly known as: FOLVITE ?Take 1 tablet (1 mg total) by mouth daily. ?Start taking on: Apr 11, 2022 ?  ?multivitamin with minerals Tabs tablet ?Take 1 tablet by mouth daily. ?Start taking on: Apr 11, 2022 ?  ?thiamine 100 MG tablet ?Take 1 tablet (100 mg total) by mouth daily. ?Start taking on: Apr 11, 2022 ?  ? ?  ? ? Follow-up Information   ? ? Lavone Orn, MD Follow up in 1 week(s).   ?Specialty: Internal Medicine ?Contact information: ?[address] ?[address] ?[place] [address] ?[phone] ? ? ?  ?  ? ?  ?  ? ?  ? ?Allergies  ?Allergen Reactions  ? Sulfa Antibiotics Other (See Comments)  ?  Sulfa-containing drugs (reaction??- occurred in childhood) [facility] has this listed, but no known reaction ?  ? ? ?The  results of significant diagnostics from this hospitalization (including imaging, microbiology, ancillary and laboratory) are listed below for reference.   ? ?Microbiology: ?Recent Results (from the past 240 hour(s))  ?Resp Panel by RT-PCR (Flu A&B, Covid) Nasopharyngeal Swab     Status: None  ? Collection Time: 04/06/22  5:31 PM  ? Specimen: Nasopharyngeal Swab; Nasopharyngeal(NP) swabs in vial transport medium  ?Result Value Ref Range Status  ? SARS Coronavirus 2 by RT PCR NEGATIVE NEGATIVE Final  ?  Comment: (NOTE) ?SARS-CoV-2 target nucleic acids are NOT DETECTED. ? ?The SARS-CoV-2 RNA is generally detectable in upper respiratory ?specimens during the acute phase of infection. The lowest ?concentration of SARS-CoV-2 viral copies this assay can detect is ?138 copies/mL. A negative result does not preclude SARS-Cov-2 ?infection and should not be used as the sole basis for treatment or ?other patient management decisions. A negative result may occur  with  ?improper specimen collection/handling, submission of specimen other ?than nasopharyngeal swab, presence of viral mutation(s) within the ?areas targeted by this assay, and inadequate number of viral ?copies(<138 copies/mL). A negative result must be combined with ?clinical observations, patient history, and epidemiological ?information. The expected result is Negative. ? ?Fact Sheet for Patients:  ?EntrepreneurPulse.com.au ? ?Fact Sheet for Healthcare Providers:  ?IncredibleEmployment.be ? ?This test is no t yet approved or cleared by the [place] FDA and  ?has been authorized for detection and/or diagnosis of SARS-CoV-2 by ?FDA under an Emergency Use Authorization (EUA). This EUA will remain  ?in effect (meaning this test can be used) for the duration of the ?COVID-19 declaration under Section 564(b)(1) of the Act, 21 ?U.S.C.section 360bbb-3(b)(1), unless the authorization is terminated  ?or revoked sooner.  ? ? ?  ?  Influenza A by PCR NEGATIVE NEGATIVE Final  ? Influenza B by PCR NEGATIVE NEGATIVE Final  ?  Comment: (NOTE) ?The Xpert Xpress SARS-CoV-2/FLU/RSV plus assay is intended as an aid ?in the diagnosis of influenza

## 2022-04-10 NOTE — Care Management Important Message (Signed)
Important Message ? ?Patient Details  ?Name: Keith Pittman ?MRN: [id number] ?Date of Birth: 1932/03/02 ? ? ?Medicare Important Message Given:  Yes ? ? ? ? ?Shandon Matson ?04/10/2022, 1:06 PM ?

## 2022-04-12 LAB — CULTURE, BLOOD (ROUTINE X 2)
Culture: NO GROWTH
Special Requests: ADEQUATE

## 2022-04-28 ENCOUNTER — Inpatient Hospital Stay (HOSPITAL_COMMUNITY)
Admission: EM | Admit: 2022-04-28 | Discharge: 2022-05-02 | DRG: 640 | Disposition: A | Discharge status: Skilled Nursing Facility | Payer: Medicare Other | Attending: Internal Medicine | Admitting: Internal Medicine

## 2022-04-28 ENCOUNTER — Emergency Department (HOSPITAL_COMMUNITY): Payer: Medicare Other

## 2022-04-28 ENCOUNTER — Other Ambulatory Visit: Payer: Self-pay

## 2022-04-28 DIAGNOSIS — Z85038 Personal history of other malignant neoplasm of large intestine: Secondary | ICD-10-CM

## 2022-04-28 DIAGNOSIS — E119 Type 2 diabetes mellitus without complications: Secondary | ICD-10-CM | POA: Diagnosis present

## 2022-04-28 DIAGNOSIS — Z6828 Body mass index (BMI) 28.0-28.9, adult: Secondary | ICD-10-CM | POA: Diagnosis not present

## 2022-04-28 DIAGNOSIS — F0390 Unspecified dementia without behavioral disturbance: Secondary | ICD-10-CM | POA: Diagnosis present

## 2022-04-28 DIAGNOSIS — R531 Weakness: Secondary | ICD-10-CM | POA: Diagnosis present

## 2022-04-28 DIAGNOSIS — R296 Repeated falls: Secondary | ICD-10-CM | POA: Diagnosis present

## 2022-04-28 DIAGNOSIS — G9341 Metabolic encephalopathy: Secondary | ICD-10-CM | POA: Diagnosis present

## 2022-04-28 DIAGNOSIS — E8809 Other disorders of plasma-protein metabolism, not elsewhere classified: Secondary | ICD-10-CM | POA: Diagnosis present

## 2022-04-28 DIAGNOSIS — W19XXXA Unspecified fall, initial encounter: Secondary | ICD-10-CM | POA: Diagnosis present

## 2022-04-28 DIAGNOSIS — Z79899 Other long term (current) drug therapy: Secondary | ICD-10-CM | POA: Diagnosis not present

## 2022-04-28 DIAGNOSIS — Z87891 Personal history of nicotine dependence: Secondary | ICD-10-CM

## 2022-04-28 DIAGNOSIS — F03B11 Unspecified dementia, moderate, with agitation: Secondary | ICD-10-CM | POA: Diagnosis present

## 2022-04-28 DIAGNOSIS — R627 Adult failure to thrive: Principal | ICD-10-CM | POA: Diagnosis present

## 2022-04-28 DIAGNOSIS — E039 Hypothyroidism, unspecified: Secondary | ICD-10-CM | POA: Diagnosis present

## 2022-04-28 DIAGNOSIS — Z882 Allergy status to sulfonamides status: Secondary | ICD-10-CM

## 2022-04-28 DIAGNOSIS — Z20822 Contact with and (suspected) exposure to covid-19: Secondary | ICD-10-CM | POA: Diagnosis present

## 2022-04-28 DIAGNOSIS — N390 Urinary tract infection, site not specified: Secondary | ICD-10-CM | POA: Diagnosis present

## 2022-04-28 DIAGNOSIS — S065XAA Traumatic subdural hemorrhage with loss of consciousness status unknown, initial encounter: Secondary | ICD-10-CM | POA: Diagnosis present

## 2022-04-28 LAB — COMPREHENSIVE METABOLIC PANEL
ALT: 11 U/L (ref 0–44)
AST: 20 U/L (ref 15–41)
Albumin: 4.1 g/dL (ref 3.5–5.0)
Alkaline Phosphatase: 67 U/L (ref 38–126)
Anion gap: 10 (ref 5–15)
BUN: 21 mg/dL (ref 8–23)
CO2: 24 mmol/L (ref 22–32)
Calcium: 10.7 mg/dL — ABNORMAL HIGH (ref 8.9–10.3)
Chloride: 107 mmol/L (ref 98–111)
Creatinine, Ser: 1.05 mg/dL (ref 0.61–1.24)
GFR, Estimated: >60 mL/min (ref >60)
Glucose, Bld: 136 mg/dL — ABNORMAL HIGH (ref 70–99)
Potassium: 4.3 mmol/L (ref 3.5–5.1)
Sodium: 141 mmol/L (ref 135–145)
Total Bilirubin: 1.1 mg/dL (ref 0.3–1.2)
Total Protein: 8.7 g/dL — ABNORMAL HIGH (ref 6.5–8.1)

## 2022-04-28 LAB — URINALYSIS, ROUTINE W REFLEX MICROSCOPIC
Bilirubin Urine: NEGATIVE
Glucose, UA: NEGATIVE mg/dL
Ketones, ur: 20 mg/dL — AB
Nitrite: POSITIVE — AB
Protein, ur: 30 mg/dL — AB
Specific Gravity, Urine: 1.013 (ref 1.005–1.030)
WBC, UA: >50 WBC/hpf — ABNORMAL HIGH (ref 0–5)
pH: 5 (ref 5.0–8.0)

## 2022-04-28 LAB — CBC WITH DIFFERENTIAL/PLATELET
Abs Immature Granulocytes: 0.48 10*3/uL — ABNORMAL HIGH (ref 0.00–0.07)
Basophils Absolute: 0 10*3/uL (ref 0.0–0.1)
Basophils Relative: 0 %
Eosinophils Absolute: 0 10*3/uL (ref 0.0–0.5)
Eosinophils Relative: 0 %
HCT: 46.8 % (ref 39.0–52.0)
Hemoglobin: 15.3 g/dL (ref 13.0–17.0)
Immature Granulocytes: 7 %
Lymphocytes Relative: 9 %
Lymphs Abs: 0.6 10*3/uL — ABNORMAL LOW (ref 0.7–4.0)
MCH: 30.8 pg (ref 26.0–34.0)
MCHC: 32.7 g/dL (ref 30.0–36.0)
MCV: 94.2 fL (ref 80.0–100.0)
Monocytes Absolute: 0.3 10*3/uL (ref 0.1–1.0)
Monocytes Relative: 5 %
Neutro Abs: 5.1 10*3/uL (ref 1.7–7.7)
Neutrophils Relative %: 79 %
Platelets: 197 10*3/uL (ref 150–400)
RBC: 4.97 MIL/uL (ref 4.22–5.81)
RDW: 13.3 % (ref 11.5–15.5)
WBC: 6.5 10*3/uL (ref 4.0–10.5)
nRBC: 0 % (ref 0.0–0.2)

## 2022-04-28 LAB — CK: Total CK: 57 U/L (ref 49–397)

## 2022-04-28 LAB — SARS CORONAVIRUS 2 BY RT PCR: SARS Coronavirus 2 by RT PCR: NEGATIVE

## 2022-04-28 LAB — LIPASE, BLOOD: Lipase: 21 U/L (ref 11–51)

## 2022-04-28 MED ORDER — ACETAMINOPHEN 325 MG PO TABS
650.0000 mg | ORAL_TABLET | Freq: Four times a day (QID) | ORAL | Status: DC | PRN
  Administered 2022-04-29: 650 mg via ORAL
  Filled 2022-04-28: qty 2

## 2022-04-28 MED ORDER — LACTATED RINGERS IV BOLUS
1000.0000 mL | Freq: Once | INTRAVENOUS | Status: AC
  Administered 2022-04-28: 1000 mL via INTRAVENOUS

## 2022-04-28 MED ORDER — ENOXAPARIN SODIUM 40 MG/0.4ML IJ SOSY: 40.0000 mg | PREFILLED_SYRINGE | INTRAMUSCULAR | Status: DC

## 2022-04-28 MED ORDER — SODIUM CHLORIDE 0.9 % IV SOLN
1.0000 g | INTRAVENOUS | Status: DC
  Administered 2022-04-29 – 2022-05-02 (×4): 1 g via INTRAVENOUS
  Filled 2022-04-28 (×4): qty 10

## 2022-04-28 MED ORDER — SODIUM CHLORIDE 0.9 % IV SOLN: INTRAVENOUS | Status: AC

## 2022-04-28 MED ORDER — ONDANSETRON HCL 4 MG/2ML IJ SOLN: 4.0000 mg | Freq: Four times a day (QID) | INTRAMUSCULAR | Status: DC | PRN

## 2022-04-28 MED ORDER — THIAMINE HCL 100 MG PO TABS
100.0000 mg | ORAL_TABLET | Freq: Every day | ORAL | Status: DC
  Administered 2022-04-28 – 2022-05-02 (×5): 100 mg via ORAL
  Filled 2022-04-28 (×5): qty 1

## 2022-04-28 MED ORDER — SODIUM CHLORIDE 0.9 % IV BOLUS
1000.0000 mL | Freq: Once | INTRAVENOUS | Status: AC
  Administered 2022-04-28: 1000 mL via INTRAVENOUS

## 2022-04-28 MED ORDER — SODIUM CHLORIDE 0.9 % IV SOLN
1.0000 g | Freq: Once | INTRAVENOUS | Status: AC
  Administered 2022-04-28: 1 g via INTRAVENOUS
  Filled 2022-04-28: qty 10

## 2022-04-28 MED ORDER — HALOPERIDOL LACTATE 5 MG/ML IJ SOLN: 3.0000 mg | Freq: Four times a day (QID) | INTRAMUSCULAR | Status: DC | PRN

## 2022-04-28 MED ORDER — ACETAMINOPHEN 650 MG RE SUPP: 650.0000 mg | Freq: Four times a day (QID) | RECTAL | Status: DC | PRN

## 2022-04-28 MED ORDER — ADULT MULTIVITAMIN W/MINERALS CH
1.0000 | ORAL_TABLET | Freq: Every day | ORAL | Status: DC
  Administered 2022-04-28 – 2022-05-02 (×5): 1 via ORAL
  Filled 2022-04-28 (×6): qty 1

## 2022-04-28 MED ORDER — ONDANSETRON HCL 4 MG PO TABS: 4.0000 mg | ORAL_TABLET | Freq: Four times a day (QID) | ORAL | Status: DC | PRN

## 2022-04-28 MED ORDER — FOLIC ACID 1 MG PO TABS
1.0000 mg | ORAL_TABLET | Freq: Every day | ORAL | Status: DC
  Administered 2022-04-28 – 2022-05-02 (×5): 1 mg via ORAL
  Filled 2022-04-28 (×5): qty 1

## 2022-04-28 MED ORDER — DIVALPROEX SODIUM 250 MG PO DR TAB
250.0000 mg | DELAYED_RELEASE_TABLET | Freq: Every day | ORAL | Status: DC
  Administered 2022-04-28 – 2022-05-02 (×3): 250 mg via ORAL
  Filled 2022-04-28 (×4): qty 1

## 2022-04-28 NOTE — ED Notes (Signed)
Sister at bedside provided social work phone number per Shannon-social work request ; to return phone call.

## 2022-04-28 NOTE — Progress Notes (Signed)
Pt oriented X 3. Did not know time. Stating "I want to go home to my house, now. There is nothing wrong with me. I fell. I'm not hurt. I want to go home."

## 2022-04-28 NOTE — Progress Notes (Signed)
CSW reviewed patients chart and patient was discharged to [facility] SNF on 04/10/22. CSW called patients sister who is also listed as HCPOA. CSW left a message requesting a call back. CSW will not be able to provide SNF placement from the ED due to patient having medicare A/B insurance coverage. The Medicare A/B wavier ended on 04/04/22. Patients are now required to be admitted to the medical floor and have a 3 night qualifying stay for medicare A/B to cover SNF placements.

## 2022-04-28 NOTE — Progress Notes (Signed)
CSW spoke with patients sister, Keith Pittman, [phone]. CSW explained how patients insurance will not cover SNF from the ED and that patient would have to meet criteria to be admitted and have a 3 night qualifying stay. CSW told patients sister if he does not meet criteria that he would have to be discharged home and home health services can be setup. Patients sister stated patient was at [facility] and was discharged. Keith Pittman stated that patient did not cooperate with the plan of care when he was in the SNF. Keith Pittman also told CSW that she has hired caregivers in the past and patient has also had home health in the past. Keith Pittman stated patient is verbally abusive and will run staff off in his house. Keith Pittman stated patient does not want anyone living with him. Keith Pittman stated that at previous hospital stay that it was determined patient was competent. Keith Pittman stated she asked for patients capacity to be assessed at his hospital stay a few weeks ago but she was told that since she is the [facility] that a competency assessment wasn't necessary. Keith Pittman asked for help with patient receiving VA insurance. CSW suggested she goes in person to [facility] and see if patient qualifies for any services. CSW also told Keith Pittman she can contact [facility] to see if they have any additional services to offer.

## 2022-04-28 NOTE — ED Provider Notes (Signed)
[facility] DEPT Provider Note   CSN: [id number] Arrival date & time: 04/28/22  1107     History  Chief Complaint  Patient presents with   Failure To Thrive   Fall    Keith Pittman is a 88 y.o. male.  Patient here as family member wanted him evaluated and placed back to skilled nursing facility.  Recently admitted went to skilled nursing facility because he is unable to take care of himself at home.  He did not want to stay there and family took him out but now he has been at home for the last several days not able to get out of bed and take care of himself.  Patient is too weak to walk.  He has no complaints.  He denies any pain.  Denies any infectious symptoms.  He has not been able to walk or take care of himself per family.  They would like him placed again.  The history is provided by the patient and a relative.      Home Medications Prior to Admission medications   Medication Sig Start Date End Date Taking? Authorizing Provider  divalproex (DEPAKOTE) 250 MG DR tablet Take 250 mg by mouth at bedtime.    [provider]  folic acid (FOLVITE) 1 MG tablet Take 1 tablet (1 mg total) by mouth daily. 04/11/22 05/11/22  Antonieta Pert, MD  Multiple Vitamin (MULTIVITAMIN WITH MINERALS) TABS tablet Take 1 tablet by mouth daily. 04/11/22 05/11/22  Antonieta Pert, MD  thiamine 100 MG tablet Take 1 tablet (100 mg total) by mouth daily. 04/11/22 05/11/22  Antonieta Pert, MD      Allergies    Sulfa antibiotics    Review of Systems   Review of Systems  Physical Exam Updated Vital Signs BP 126/85   Pulse 68   Temp 98.9 F (37.2 C) (Oral)   Resp 20   Ht '5\' 11"'$  (1.803 m)   Wt 94.1 kg Comment: per record  SpO2 96%   BMI 28.93 kg/m  Physical Exam Vitals and nursing note reviewed.  Constitutional:      General: He is not in acute distress.    Appearance: He is well-developed.  HENT:     Head: Normocephalic and atraumatic.     Mouth/Throat:     Mouth:  Mucous membranes are dry.  Eyes:     Conjunctiva/sclera: Conjunctivae normal.  Cardiovascular:     Rate and Rhythm: Normal rate and regular rhythm.     Pulses: Normal pulses.     Heart sounds: No murmur heard. Pulmonary:     Effort: Pulmonary effort is normal. No respiratory distress.     Breath sounds: Normal breath sounds.  Abdominal:     Palpations: Abdomen is soft.     Tenderness: There is no abdominal tenderness.  Musculoskeletal:        General: No swelling.     Cervical back: Neck supple.  Skin:    General: Skin is warm and dry.     Capillary Refill: Capillary refill takes less than 2 seconds.  Neurological:     General: No focal deficit present.     Mental Status: He is alert.  Psychiatric:        Mood and Affect: Mood normal.    ED Results / Procedures / Treatments   Labs (all labs ordered are listed, but only abnormal results are displayed) Labs Reviewed  CBC WITH DIFFERENTIAL/PLATELET - Abnormal; Notable for the following components:  Result Value   Lymphs Abs 0.6 (*)    Abs Immature Granulocytes 0.48 (*)    All other components within normal limits  COMPREHENSIVE METABOLIC PANEL - Abnormal; Notable for the following components:   Glucose, Bld 136 (*)    Calcium 10.7 (*)    Total Protein 8.7 (*)    All other components within normal limits  URINALYSIS, ROUTINE W REFLEX MICROSCOPIC - Abnormal; Notable for the following components:   APPearance HAZY (*)    Hgb urine dipstick SMALL (*)    Ketones, ur 20 (*)    Protein, ur 30 (*)    Nitrite POSITIVE (*)    Leukocytes,Ua LARGE (*)    WBC, UA >50 (*)    Bacteria, UA MANY (*)    All other components within normal limits  SARS CORONAVIRUS 2 BY RT PCR  URINE CULTURE  LIPASE, BLOOD  CK    EKG EKG Interpretation  Date/Time:  Sunday April 28 2022 11:50:42 EDT Ventricular Rate:  82 PR Interval:  199 QRS Duration: 92 QT Interval:  347 QTC Calculation: 406 R Axis:   15 Text Interpretation: Sinus rhythm  Low voltage, extremity and precordial leads Confirmed by Lennice Sites ([id number]) on 04/28/2022 12:05:25 PM  Radiology DG Chest 2 View  Result Date: 04/28/2022 CLINICAL DATA:  Failure to thrive. Recurrent falls. Diabetes. Colon carcinoma. EXAM: CHEST - 2 VIEW COMPARISON:  04/06/2022 FINDINGS: The heart size and mediastinal contours are within normal limits. Both lungs are clear. Thoracic spine degenerative changes noted. IMPRESSION: No active cardiopulmonary disease. Electronically Signed   By: Marlaine Hind M.D.   On: 04/28/2022 12:42   DG Pelvis 1-2 Views  Result Date: 04/28/2022 CLINICAL DATA:  Fall at home.  Multiple trauma.  Pelvic pain. EXAM: PELVIS - 1-2 VIEW COMPARISON:  04/06/2022 FINDINGS: There is no evidence of pelvic fracture or diastasis. No pelvic bone lesions are seen. IMPRESSION: Negative. Electronically Signed   By: Marlaine Hind M.D.   On: 04/28/2022 12:43   CT Head Wo Contrast  Result Date: 04/28/2022 CLINICAL DATA:  Head trauma, moderate-severe fall; Neck trauma (Age >= 65y) EXAM: CT HEAD WITHOUT CONTRAST CT CERVICAL SPINE WITHOUT CONTRAST TECHNIQUE: Multidetector CT imaging of the head and cervical spine was performed following the standard protocol without intravenous contrast. Multiplanar CT image reconstructions of the cervical spine were also generated. RADIATION DOSE REDUCTION: This exam was performed according to the departmental dose-optimization program which includes automated exposure control, adjustment of the mA and/or kV according to patient size and/or use of iterative reconstruction technique. COMPARISON:  04/06/2022 FINDINGS: CT HEAD FINDINGS Brain: Persistent small bilateral subdural hematomas overlying the bilateral cerebral convexities remaining slightly higher in attenuation relative to cerebral spinal fluid. Collections measure approximately 6-7 mm in thickness bilaterally, unchanged. No significant mass effect or midline shift. No evidence of acute large territory  infarction. No new sites of intracranial hemorrhage. No hydrocephalus. Extensive low-density changes within the periventricular and subcortical white matter compatible with chronic microvascular ischemic change. Moderate diffuse cerebral volume loss. Vascular: Atherosclerotic calcifications involving the large vessels of the skull base. No unexpected hyperdense vessel. Skull: Normal. Negative for fracture or focal lesion. Sinuses/Orbits: No new/acute finding. Other: None. CT CERVICAL SPINE FINDINGS Alignment: Facet joints are aligned without dislocation or traumatic listhesis. Dens and lateral masses are aligned. Skull base and vertebrae: No acute fracture. No primary bone lesion or focal pathologic process. Soft tissues and spinal canal: No prevertebral fluid or swelling. No visible canal hematoma. Disc levels: Similar  degree of mild multilevel cervical spondylosis. Upper chest: Negative. Other: Bilateral carotid atherosclerosis. IMPRESSION: 1. Stable small chronic bilateral subdural hematomas. 2. No new/acute intracranial findings. 3. Chronic microvascular ischemic change and cerebral volume loss. 4. No acute fracture or subluxation of the cervical spine. Electronically Signed   By: Davina Poke D.O.   On: 04/28/2022 12:52   CT Cervical Spine Wo Contrast  Result Date: 04/28/2022 CLINICAL DATA:  Head trauma, moderate-severe fall; Neck trauma (Age >= 65y) EXAM: CT HEAD WITHOUT CONTRAST CT CERVICAL SPINE WITHOUT CONTRAST TECHNIQUE: Multidetector CT imaging of the head and cervical spine was performed following the standard protocol without intravenous contrast. Multiplanar CT image reconstructions of the cervical spine were also generated. RADIATION DOSE REDUCTION: This exam was performed according to the departmental dose-optimization program which includes automated exposure control, adjustment of the mA and/or kV according to patient size and/or use of iterative reconstruction technique. COMPARISON:   04/06/2022 FINDINGS: CT HEAD FINDINGS Brain: Persistent small bilateral subdural hematomas overlying the bilateral cerebral convexities remaining slightly higher in attenuation relative to cerebral spinal fluid. Collections measure approximately 6-7 mm in thickness bilaterally, unchanged. No significant mass effect or midline shift. No evidence of acute large territory infarction. No new sites of intracranial hemorrhage. No hydrocephalus. Extensive low-density changes within the periventricular and subcortical white matter compatible with chronic microvascular ischemic change. Moderate diffuse cerebral volume loss. Vascular: Atherosclerotic calcifications involving the large vessels of the skull base. No unexpected hyperdense vessel. Skull: Normal. Negative for fracture or focal lesion. Sinuses/Orbits: No new/acute finding. Other: None. CT CERVICAL SPINE FINDINGS Alignment: Facet joints are aligned without dislocation or traumatic listhesis. Dens and lateral masses are aligned. Skull base and vertebrae: No acute fracture. No primary bone lesion or focal pathologic process. Soft tissues and spinal canal: No prevertebral fluid or swelling. No visible canal hematoma. Disc levels: Similar degree of mild multilevel cervical spondylosis. Upper chest: Negative. Other: Bilateral carotid atherosclerosis. IMPRESSION: 1. Stable small chronic bilateral subdural hematomas. 2. No new/acute intracranial findings. 3. Chronic microvascular ischemic change and cerebral volume loss. 4. No acute fracture or subluxation of the cervical spine. Electronically Signed   By: Davina Poke D.O.   On: 04/28/2022 12:52    Procedures Procedures    Medications Ordered in ED Medications  cefTRIAXone (ROCEPHIN) 1 g in sodium chloride 0.9 % 100 mL IVPB (has no administration in time range)  sodium chloride 0.9 % bolus 1,000 mL (1,000 mLs Intravenous New Bag/Given 04/28/22 1226)  cefTRIAXone (ROCEPHIN) 1 g in sodium chloride 0.9 % 100 mL  IVPB (1 g Intravenous New Bag/Given 04/28/22 1353)    ED Course/ Medical Decision Making/ A&P                           Medical Decision Making Amount and/or Complexity of Data Reviewed Labs: ordered. Radiology: ordered.  Risk Decision regarding hospitalization.   Laurey Morale is here for social work reasons.  Normal vitals.  No fever.  Patient with history of diabetes and colon cancer.  Some cognitive deficits.  Recently family removed him skilled nursing facility where he was living as he is unable to take care of himself.  He is not done well at home by himself.  Family would like for him to be placed again.  They state that they should have never taken them out.  Patient has no complaints.  He looks dry on exam but neurologically appears to be intact.  We will  do basic screening labs including CT scan of his head, neck as well as x-ray of his chest and pelvis.  We will give fluid bolus.  We will talk to transitions of care team.  Per my review and interpretation of labs patient does have urinary tract infection.  COVID test is negative.  Leukocytosis or anemia or electrolyte abnormality.  No concern for sepsis.  Head CT and neck CT overall with no acute findings.  Chest x-ray and pelvic x-ray per my review and interpretation showed no acute findings.  Overall ongoing failure to thrive symptoms with ongoing urinary tract infection.  We will give a dose IV Rocephin have admitted to medicine for further care and hydration.  Clinically he looks dehydrated.  He needs skill facility full-time.  This chart was dictated using voice recognition software.  Despite best efforts to proofread,  errors can occur which can change the documentation meaning.         Final Clinical Impression(s) / ED Diagnoses Final diagnoses:  Lower urinary tract infectious disease  Failure to thrive in adult    Rx / DC Orders ED Discharge Orders     None         Lennice Sites, DO 04/28/22 1428

## 2022-04-28 NOTE — ED Triage Notes (Signed)
Pt to ED via EMS from home c/o failure to thrive. Pt has hx of dementia, lives alone. According to family pt is falling a lot ,. Pt apparently fell today, not on blood thinners, no obvious injuries, No LOC. Pt not able to attend to his ADLS and having poor oral intake. Orientation at baseline. No medications given by EMS. Last VS: 139/80, P 89, 96%RA, CBG 151.

## 2022-04-28 NOTE — H&P (Signed)
History and Physical    Patient: Keith Pittman AOZ:[id number] DOB: 04-15-1932 DOA: 04/28/2022 DOS: the patient was seen and examined on 04/28/2022 PCP: Lavone Orn, MD  Patient coming from: Home  Chief Complaint:  Chief Complaint  Patient presents with   Failure To Thrive   Fall   HPI: Keith Pittman is a 88 y.o. male with medical history significant of colon cancer treated with surgery, type II DM, hypothyroidism, subdural hematoma who was recently admitted from home and discharged to SNF due to lower urinary tract infection, acute metabolic encephalopathy and history of alcohol abuse.  He recently was discharged back to his home after he asked his family members to take him out.  However, the last few days the patient is not able to take care of himself.  He is currently unable to walk and take care of his ADLs.  He is able to answer simple questions and denied headache, chest pain, back or abdominal pain.  He is confused, knew he was in the hospital, but was unable to say who which hospital.  He could not elaborate further on HPI.  ED course: Initial vital signs were temperature 98.9 F, pulse 85, respirations 16, BP 126/75 mmHg O2 sat 96% on room air.  The patient received 1 g of ceftriaxone and 1000 mL normal saline bolus.  I added LR 1000 mL bolus.  Lab work: Urinalysis showed small hemoglobinuria, ketonuria 20 and proteinuria 30 mg deciliter, positive nitrites, large leukocyte esterase more than 50 WBC with many bacteria microscopic examination.  CBC with a white count 6.5, hemoglobin 15.3 g/dL platelets 197.  Total CK 57 and lipase 21 units/L.  Glucose 136 and calcium 10.7 mg/dL.  Total protein is 8.7 g/dL.  The rest of the CMP was normal.  Imaging: Two-view chest radiograph with no acute cardiopulmonary disease.  Pelvics x-ray was negative.  CT head without contrast showed stable small chronic bilateral subdural hematomas.  There was no new intracranial findings.  There was no acute  fracture or subluxation of cervical spine.   Review of Systems: As mentioned in the history of present illness. All other systems reviewed and are negative. Past Medical History:  Diagnosis Date   Cancer [facility])    colon   Diabetes mellitus without complication ([place])    Past Surgical History:  Procedure Laterality Date   COLON SURGERY     Social History:  reports that he has quit smoking. He has never used smokeless tobacco. He reports that he does not currently use alcohol. He reports that he does not use drugs.  Allergies  Allergen Reactions   Sulfa Antibiotics Other (See Comments)    Unknown reaction     No family history on file.  Prior to Admission medications   Medication Sig Start Date End Date Taking? Authorizing Provider  divalproex (DEPAKOTE) 250 MG DR tablet Take 250 mg by mouth at bedtime. Patient not taking: Reported on 04/28/2022    [provider]  folic acid (FOLVITE) 1 MG tablet Take 1 tablet (1 mg total) by mouth daily. Patient not taking: Reported on 04/28/2022 04/11/22 05/11/22  Antonieta Pert, MD  Multiple Vitamin (MULTIVITAMIN WITH MINERALS) TABS tablet Take 1 tablet by mouth daily. Patient not taking: Reported on 04/28/2022 04/11/22 05/11/22  Antonieta Pert, MD  thiamine 100 MG tablet Take 1 tablet (100 mg total) by mouth daily. Patient not taking: Reported on 04/28/2022 04/11/22 05/11/22  Antonieta Pert, MD    Physical Exam: Vitals:   04/28/22  1430 04/28/22 1437 04/28/22 1445 04/28/22 1500  BP: 131/70  123/66 (!) 132/57  Pulse: 66  65 61  Resp: 18  16   Temp:  98.1 F (36.7 C)    TempSrc:  Oral    SpO2: 100%  97% 97%  Weight:      Height:       Physical Exam Vitals and nursing note reviewed.  Constitutional:      General: He is awake.     Appearance: He is obese. He is ill-appearing.     Comments: Looks disheveled.  HENT:     Head: Normocephalic.     Mouth/Throat:     Mouth: Mucous membranes are dry.  Eyes:     General: No scleral icterus.    Pupils:  Pupils are equal, round, and reactive to light.  Neck:     Vascular: No JVD.  Cardiovascular:     Rate and Rhythm: Normal rate and regular rhythm.     Heart sounds: S1 normal and S2 normal.  Pulmonary:     Effort: Pulmonary effort is normal.     Breath sounds: Normal breath sounds.  Abdominal:     General: There is no distension.     Palpations: Abdomen is soft.     Tenderness: There is no abdominal tenderness. There is no guarding or rebound.  Musculoskeletal:     Cervical back: Neck supple.     Right lower leg: No edema.     Left lower leg: No edema.  Skin:    General: Skin is warm and dry.     Comments: Dryness and hyperkeratosis of the lower extremities.  Neurological:     General: No focal deficit present.     Mental Status: He is alert. He is disoriented.  Psychiatric:        Attention and Perception: He is inattentive.        Mood and Affect: Mood is anxious. Affect is angry.        Behavior: Behavior is hyperactive.        Cognition and Memory: Cognition is impaired. Memory is impaired. He exhibits impaired recent memory and impaired remote memory.     Comments: Patient asked similar questions multiple times.   Data Reviewed:  There are no new results to review at this time.  Assessment and Plan: Principal Problem:   Failure to thrive in adult In the setting of:   Dementia ([place]) Admit to MedSurg/inpatient. Resume folate, MVI and thiamine. Resume valproate at bedtime. Haloperidol 3 mg IVP q 6 hours for restlessness. Consult transitional care team.  Active Problems:   Lower urinary tract infectious disease Continue ceftriaxone 1 g IVP daily. Follow-up urine culture and sensitivity. Consult PT if still weak after treatment.    Subdural hematoma (HCC) Previous history of EtOH use. Imaging is stable.    Hypothyroidism Recent TSH mildly elevated. Recheck TSH level in the morning.    Hypercalcemia Recheck calcium level in AM.    Hyperproteinemia Recheck  protein level after hydration.    Advance Care Planning:   Code Status: Prior   Consults:   Family Communication:   Severity of Illness: The appropriate patient status for this patient is INPATIENT. Inpatient status is judged to be reasonable and necessary in order to provide the required intensity of service to ensure the patient's safety. The patient's presenting symptoms, physical exam findings, and initial radiographic and laboratory data in the context of their chronic comorbidities is felt to place them at  high risk for further clinical deterioration. Furthermore, it is not anticipated that the patient will be medically stable for discharge from the hospital within 2 midnights of admission.   * I certify that at the point of admission it is my clinical judgment that the patient will require inpatient hospital care spanning beyond 2 midnights from the point of admission due to high intensity of service, high risk for further deterioration and high frequency of surveillance required.*  Author: Reubin Milan, MD 04/28/2022 3:22 PM  For on call review [email or web address].   This document was prepared using Dragon voice recognition software may contain some unintended transcription errors.

## 2022-04-28 NOTE — ED Notes (Signed)
Pt transported for imaging. 

## 2022-04-29 ENCOUNTER — Encounter (HOSPITAL_COMMUNITY): Payer: Self-pay | Admitting: *Deleted

## 2022-04-29 DIAGNOSIS — R627 Adult failure to thrive: Secondary | ICD-10-CM | POA: Diagnosis not present

## 2022-04-29 LAB — COMPREHENSIVE METABOLIC PANEL
ALT: 8 U/L (ref 0–44)
AST: 18 U/L (ref 15–41)
Albumin: 3.1 g/dL — ABNORMAL LOW (ref 3.5–5.0)
Alkaline Phosphatase: 52 U/L (ref 38–126)
Anion gap: 5 (ref 5–15)
BUN: 17 mg/dL (ref 8–23)
CO2: 23 mmol/L (ref 22–32)
Calcium: 9.5 mg/dL (ref 8.9–10.3)
Chloride: 113 mmol/L — ABNORMAL HIGH (ref 98–111)
Creatinine, Ser: 0.88 mg/dL (ref 0.61–1.24)
GFR, Estimated: >60 mL/min (ref >60)
Glucose, Bld: 111 mg/dL — ABNORMAL HIGH (ref 70–99)
Potassium: 4 mmol/L (ref 3.5–5.1)
Sodium: 141 mmol/L (ref 135–145)
Total Bilirubin: 1.2 mg/dL (ref 0.3–1.2)
Total Protein: 6.6 g/dL (ref 6.5–8.1)

## 2022-04-29 LAB — CBC
HCT: 36.2 % — ABNORMAL LOW (ref 39.0–52.0)
Hemoglobin: 11.8 g/dL — ABNORMAL LOW (ref 13.0–17.0)
MCH: 30.6 pg (ref 26.0–34.0)
MCHC: 32.6 g/dL (ref 30.0–36.0)
MCV: 94 fL (ref 80.0–100.0)
Platelets: 152 10*3/uL (ref 150–400)
RBC: 3.85 MIL/uL — ABNORMAL LOW (ref 4.22–5.81)
RDW: 13.3 % (ref 11.5–15.5)
WBC: 9 10*3/uL (ref 4.0–10.5)
nRBC: 0 % (ref 0.0–0.2)

## 2022-04-29 LAB — TSH: TSH: 2.538 u[IU]/mL (ref 0.350–4.500)

## 2022-04-29 MED ORDER — SODIUM CHLORIDE 0.9 % IV SOLN
INTRAVENOUS | Status: AC
Start: 2022-04-29 — End: 2022-04-30

## 2022-04-29 MED ORDER — ENOXAPARIN SODIUM 40 MG/0.4ML IJ SOSY
40.0000 mg | PREFILLED_SYRINGE | INTRAMUSCULAR | Status: DC
  Administered 2022-04-29: 40 mg via SUBCUTANEOUS
  Filled 2022-04-29 (×2): qty 0.4

## 2022-04-29 MED ORDER — VITAMIN B-12 1000 MCG PO TABS
1000.0000 ug | ORAL_TABLET | Freq: Every day | ORAL | Status: DC
  Administered 2022-04-29 – 2022-05-02 (×4): 1000 ug via ORAL
  Filled 2022-04-29 (×4): qty 1

## 2022-04-29 NOTE — Plan of Care (Signed)

## 2022-04-29 NOTE — Progress Notes (Signed)
PROGRESS NOTE    Keith Pittman  KGM:[id number] DOB: [birth year] DOA: 04/28/2022 PCP: Lavone Orn, MD   Brief Narrative: 88 year old with past medical history significant for colon cancer treated with surgery, type 2 diabetes, hypothyroidism, subdural hematoma recent admission from home and discharged to SNF due to UTI and metabolic encephalopathy and a prior history of alcohol abuse.  He was recently discharged back home from rehab after family member took him out.  Over the last few days patient is not able to take care of himself.  Unable to walk and do his ADLs.  Evaluation in the ED he was found to have UTI, UA with positive nitrates large leukocytes.  CT head without contrast showed stable small chronic bilateral subdural hematoma. Pelvis x ray negative.    Assessment & Plan:   Principal Problem:   Failure to thrive in adult Active Problems:   Subdural hematoma (HCC)   Lower urinary tract infectious disease   Hypothyroidism   Hypercalcemia   Hyperproteinemia   Dementia (HCC)   1-FTT:  In setting dementia nd UTI.  PT eval.   2-Dementia; continue with vitamins.  Haldol PRN for agitation.  Psych eval for capacity  UTI;  Continue with IV ceftriaxone.  Follow urine culture. Growing citrobacter.   Hypothyroidism: TSH normal.    Hypercalcemia : Continue with IV fluids Resolved.   Chronic subdural hematoma; stable.        Pressure Injury 09/29/19 Sacrum Deep Tissue Injury - Purple or maroon localized area of discolored intact skin or blood-filled blister due to damage of underlying soft tissue from pressure and/or shear. (Active)  09/29/19 0030  Location: Sacrum  Location Orientation:   Staging: Deep Tissue Injury - Purple or maroon localized area of discolored intact skin or blood-filled blister due to damage of underlying soft tissue from pressure and/or shear.  Wound Description (Comments):   Present on Admission:                   Estimated  body mass index is 28.93 kg/m as calculated from the following:   Height as of this encounter: '5\' 11"'$  (1.803 m).   Weight as of this encounter: 94.1 kg.   DVT prophylaxis: Lovenox Code Status: Full code Family Communication: No family at bedside.  Disposition Plan:  Status is: Inpatient Remains inpatient appropriate because: remain inpatient for management UTI    Consultants:  Psych  Procedures:  none  Antimicrobials:    Subjective: He is alert, oriented to person.   Objective: Vitals:   04/28/22 1527 04/28/22 1950 04/28/22 2340 04/29/22 0425  BP: 112/66 (!) 141/60 (!) 132/95 (!) 111/53  Pulse: 64 73 85 72  Resp: '16 18 18 18  '$ Temp: 97.9 F (36.6 C) 99.5 F (37.5 C) (!) 101.3 F (38.5 C) 98.7 F (37.1 C)  TempSrc: Oral Oral Oral Oral  SpO2: 100% 97% 96% 94%  Weight:      Height:        Intake/Output Summary (Last 24 hours) at 04/29/2022 0726 Last data filed at 04/28/2022 1900 Gross per 24 hour  Intake 1219.64 ml  Output 400 ml  Net 819.64 ml   Filed Weights   04/28/22 1127  Weight: 94.1 kg    Examination:  General exam: Appears calm and comfortable  Respiratory system: Clear to auscultation. Respiratory effort normal. Cardiovascular system: S1 & S2 heard, RRR. No JVD, murmurs, rubs, gallops or clicks. No pedal edema. Gastrointestinal system: Abdomen is nondistended, soft and nontender. No organomegaly or  masses felt. Normal bowel sounds heard. Central nervous system: Alert and oriented.  Extremities: Symmetric 5 x 5 power.    Data Reviewed: I have personally reviewed following labs and imaging studies  CBC: Recent Labs  Lab 04/28/22 1148 04/29/22 0454  WBC 6.5 9.0  NEUTROABS 5.1  --   HGB 15.3 11.8*  HCT 46.8 36.2*  MCV 94.2 94.0  PLT 197 443   Basic Metabolic Panel: Recent Labs  Lab 04/28/22 1148 04/29/22 0454  NA 141 141  K 4.3 4.0  CL 107 113*  CO2 24 23  GLUCOSE 136* 111*  BUN 21 17  CREATININE 1.05 0.88  CALCIUM 10.7* 9.5    GFR: Estimated Creatinine Clearance: 69.3 mL/min (by C-G formula based on SCr of 0.88 mg/dL). Liver Function Tests: Recent Labs  Lab 04/28/22 1148 04/29/22 0454  AST 20 18  ALT 11 8  ALKPHOS 67 52  BILITOT 1.1 1.2  PROT 8.7* 6.6  ALBUMIN 4.1 3.1*   Recent Labs  Lab 04/28/22 1148  LIPASE 21   No results for input(s): AMMONIA in the last 168 hours. Coagulation Profile: No results for input(s): INR, PROTIME in the last 168 hours. Cardiac Enzymes: Recent Labs  Lab 04/28/22 1148  CKTOTAL 57   BNP (last 3 results) No results for input(s): PROBNP in the last 8760 hours. HbA1C: No results for input(s): HGBA1C in the last 72 hours. CBG: No results for input(s): GLUCAP in the last 168 hours. Lipid Profile: No results for input(s): CHOL, HDL, LDLCALC, TRIG, CHOLHDL, LDLDIRECT in the last 72 hours. Thyroid Function Tests: Recent Labs    04/29/22 0454  TSH 2.538   Anemia Panel: No results for input(s): VITAMINB12, FOLATE, FERRITIN, TIBC, IRON, RETICCTPCT in the last 72 hours. Sepsis Labs: No results for input(s): PROCALCITON, LATICACIDVEN in the last 168 hours.  Recent Results (from the past 240 hour(s))  SARS Coronavirus 2 by RT PCR (hospital order, performed in [facility] hospital lab) *cepheid single result test* Anterior Nasal Swab     Status: None   Collection Time: 04/28/22 12:32 PM   Specimen: Anterior Nasal Swab  Result Value Ref Range Status   SARS Coronavirus 2 by RT PCR NEGATIVE NEGATIVE Final    Comment: (NOTE) SARS-CoV-2 target nucleic acids are NOT DETECTED.  The SARS-CoV-2 RNA is generally detectable in upper and lower respiratory specimens during the acute phase of infection. The lowest concentration of SARS-CoV-2 viral copies this assay can detect is 250 copies / mL. A negative result does not preclude SARS-CoV-2 infection and should not be used as the sole basis for treatment or other patient management decisions.  A negative result may occur  with improper specimen collection / handling, submission of specimen other than nasopharyngeal swab, presence of viral mutation(s) within the areas targeted by this assay, and inadequate number of viral copies (<250 copies / mL). A negative result must be combined with clinical observations, patient history, and epidemiological information.  Fact Sheet for Patients:   https://www.patel.info/  Fact Sheet for Healthcare Providers: https://hall.com/  This test is not yet approved or  cleared by the [place] FDA and has been authorized for detection and/or diagnosis of SARS-CoV-2 by FDA under an Emergency Use Authorization (EUA).  This EUA will remain in effect (meaning this test can be used) for the duration of the COVID-19 declaration under Section 564(b)(1) of the Act, 21 U.S.C. section 360bbb-3(b)(1), unless the authorization is terminated or revoked sooner.  Performed at [facility], [address] [place], [place] [address]          Radiology Studies: DG Chest 2 View  Result Date: 04/28/2022 CLINICAL DATA:  Failure to thrive. Recurrent falls. Diabetes. Colon carcinoma. EXAM: CHEST - 2 VIEW COMPARISON:  04/06/2022 FINDINGS: The heart size and mediastinal contours are within normal limits. Both lungs are clear. Thoracic spine degenerative changes noted. IMPRESSION: No active cardiopulmonary disease. Electronically Signed   By: Marlaine Hind M.D.   On: 04/28/2022 12:42   DG Pelvis 1-2 Views  Result Date: 04/28/2022 CLINICAL DATA:  Fall at home.  Multiple trauma.  Pelvic pain. EXAM: PELVIS - 1-2 VIEW COMPARISON:  04/06/2022 FINDINGS: There is no evidence of pelvic fracture or diastasis. No pelvic bone lesions are seen. IMPRESSION: Negative. Electronically Signed   By: Marlaine Hind M.D.   On: 04/28/2022 12:43   CT Head Wo Contrast  Result Date: 04/28/2022 CLINICAL DATA:  Head trauma, moderate-severe fall; Neck  trauma (Age >= 65y) EXAM: CT HEAD WITHOUT CONTRAST CT CERVICAL SPINE WITHOUT CONTRAST TECHNIQUE: Multidetector CT imaging of the head and cervical spine was performed following the standard protocol without intravenous contrast. Multiplanar CT image reconstructions of the cervical spine were also generated. RADIATION DOSE REDUCTION: This exam was performed according to the departmental dose-optimization program which includes automated exposure control, adjustment of the mA and/or kV according to patient size and/or use of iterative reconstruction technique. COMPARISON:  04/06/2022 FINDINGS: CT HEAD FINDINGS Brain: Persistent small bilateral subdural hematomas overlying the bilateral cerebral convexities remaining slightly higher in attenuation relative to cerebral spinal fluid. Collections measure approximately 6-7 mm in thickness bilaterally, unchanged. No significant mass effect or midline shift. No evidence of acute large territory infarction. No new sites of intracranial hemorrhage. No hydrocephalus. Extensive low-density changes within the periventricular and subcortical white matter compatible with chronic microvascular ischemic change. Moderate diffuse cerebral volume loss. Vascular: Atherosclerotic calcifications involving the large vessels of the skull base. No unexpected hyperdense vessel. Skull: Normal. Negative for fracture or focal lesion. Sinuses/Orbits: No new/acute finding. Other: None. CT CERVICAL SPINE FINDINGS Alignment: Facet joints are aligned without dislocation or traumatic listhesis. Dens and lateral masses are aligned. Skull base and vertebrae: No acute fracture. No primary bone lesion or focal pathologic process. Soft tissues and spinal canal: No prevertebral fluid or swelling. No visible canal hematoma. Disc levels: Similar degree of mild multilevel cervical spondylosis. Upper chest: Negative. Other: Bilateral carotid atherosclerosis. IMPRESSION: 1. Stable small chronic bilateral subdural  hematomas. 2. No new/acute intracranial findings. 3. Chronic microvascular ischemic change and cerebral volume loss. 4. No acute fracture or subluxation of the cervical spine. Electronically Signed   By: Davina Poke D.O.   On: 04/28/2022 12:52   CT Cervical Spine Wo Contrast  Result Date: 04/28/2022 CLINICAL DATA:  Head trauma, moderate-severe fall; Neck trauma (Age >= 65y) EXAM: CT HEAD WITHOUT CONTRAST CT CERVICAL SPINE WITHOUT CONTRAST TECHNIQUE: Multidetector CT imaging of the head and cervical spine was performed following the standard protocol without intravenous contrast. Multiplanar CT image reconstructions of the cervical spine were also generated. RADIATION DOSE REDUCTION: This exam was performed according to the departmental dose-optimization program which includes automated exposure control, adjustment of the mA and/or kV according to patient size and/or use of iterative reconstruction technique. COMPARISON:  04/06/2022 FINDINGS: CT HEAD FINDINGS Brain: Persistent small bilateral subdural hematomas overlying the bilateral cerebral convexities remaining slightly higher in attenuation relative to cerebral spinal fluid. Collections measure approximately 6-7 mm in thickness bilaterally, unchanged. No significant mass  effect or midline shift. No evidence of acute large territory infarction. No new sites of intracranial hemorrhage. No hydrocephalus. Extensive low-density changes within the periventricular and subcortical white matter compatible with chronic microvascular ischemic change. Moderate diffuse cerebral volume loss. Vascular: Atherosclerotic calcifications involving the large vessels of the skull base. No unexpected hyperdense vessel. Skull: Normal. Negative for fracture or focal lesion. Sinuses/Orbits: No new/acute finding. Other: None. CT CERVICAL SPINE FINDINGS Alignment: Facet joints are aligned without dislocation or traumatic listhesis. Dens and lateral masses are aligned. Skull base  and vertebrae: No acute fracture. No primary bone lesion or focal pathologic process. Soft tissues and spinal canal: No prevertebral fluid or swelling. No visible canal hematoma. Disc levels: Similar degree of mild multilevel cervical spondylosis. Upper chest: Negative. Other: Bilateral carotid atherosclerosis. IMPRESSION: 1. Stable small chronic bilateral subdural hematomas. 2. No new/acute intracranial findings. 3. Chronic microvascular ischemic change and cerebral volume loss. 4. No acute fracture or subluxation of the cervical spine. Electronically Signed   By: Davina Poke D.O.   On: 04/28/2022 12:52        Scheduled Meds:  divalproex  250 mg Oral QHS   folic acid  1 mg Oral Daily   multivitamin with minerals  1 tablet Oral Daily   thiamine  100 mg Oral Daily   Continuous Infusions:  sodium chloride 100 mL/hr at 04/28/22 2152   cefTRIAXone (ROCEPHIN)  IV       LOS: 1 day    Time spent: 35 minutes    Dezman Granda A Christophr Calix, MD [facility]   If 7PM-7AM, please contact night-coverage [email or web address]  04/29/2022, 7:26 AM

## 2022-04-30 DIAGNOSIS — R627 Adult failure to thrive: Secondary | ICD-10-CM | POA: Diagnosis not present

## 2022-04-30 LAB — URINE CULTURE: Culture: >=100000 — AB

## 2022-04-30 NOTE — Evaluation (Signed)
Physical Therapy Evaluation Patient Details Name: Keith Pittman MRN: [id number] DOB: Oct 15, 1932 Today's Date: 04/30/2022  History of Present Illness  pt is an 88yo male presenting to [facility] ED on 6/4 from home secondary to generalized weakness and recent fall. Found to have UTI. Recently discharged himself from SNF.  PMH: hx of colon cancer s/p surgery, DM, subdural hematoma, ETOH.  Clinical Impression  Keith Pittman presents with the problems listed above and the functional impairments below. Pt generally unwilling to participate in therapy, only motivated to complete evaluation when presented with information that PT evaluation is required for discharge. Pt declines to give home environment information stating "don't worry about it, I'm tired of answering questions." Pt unwilling to be touched to assist mobility so provided close supervision to min guard to prevent fall. Pt ambulated in hallway with RW and min guard +2 for recliner follow. Recommending SNF-level therapies upon discharge secondary to generalized weakness, recent falls, and level of assist at home. We will continue to follow the pt acutely.      Recommendations for follow up therapy are one component of a multi-disciplinary discharge planning process, led by the attending physician.  Recommendations may be updated based on patient status, additional functional criteria and insurance authorization.  Follow Up Recommendations Skilled nursing-short term rehab (<3 hours/day)    Assistance Recommended at Discharge Frequent or constant Supervision/Assistance  Patient can return home with the following  A lot of help with walking and/or transfers;A lot of help with bathing/dressing/bathroom;Assistance with cooking/housework;Direct supervision/assist for medications management;Direct supervision/assist for financial management;Assist for transportation;Help with stairs or ramp for entrance    Equipment Recommendations None recommended by PT  (TBD at SNF)  Recommendations for Other Services       Functional Status Assessment Patient has had a recent decline in their functional status and demonstrates the ability to make significant improvements in function in a reasonable and predictable amount of time.     Precautions / Restrictions Precautions Precautions: Fall Precaution Comments: Pt had recent fall at home Restrictions Weight Bearing Restrictions: No      Mobility  Bed Mobility Overal bed mobility: Needs Assistance Bed Mobility: Supine to Sit     Supine to sit: Min guard     General bed mobility comments: Pt min guard for safety, no physical assist required    Transfers Overall transfer level: Needs assistance Equipment used: Rolling walker (2 wheels) Transfers: Sit to/from Stand Sit to Stand: Min assist, From elevated surface           General transfer comment: Min assist to stand from elevated surface to RW; dependent on UEs to push up, refused assistance other than steadying of RW.    Ambulation/Gait Ambulation/Gait assistance: +2 safety/equipment, Min guard Gait Distance (Feet): 40 Feet Assistive device: Rolling walker (2 wheels) Gait Pattern/deviations: Step-to pattern, Decreased stride length, Drifts right/left, Trunk flexed Gait velocity: decreased     General Gait Details: Pt generally not allowing PT to touch pt so provided very close min guard during ambulation with RW +2 for close recliner follow. One posterior LOB at start of ambulation task but pt was able to catch himself. Demonstrated trunk flexion, refused to ambulate within RW despite cuing, but was able to navigate around obstacles in hallway appropriately.  Stairs            Wheelchair Mobility    Modified Rankin (Stroke Patients Only)       Balance Overall balance assessment: History of Falls, Needs assistance Sitting-balance  support: Bilateral upper extremity supported, Feet supported Sitting balance-Leahy Scale:  Fair Sitting balance - Comments: requires some form of support to maintain balance Postural control: Posterior lean Standing balance support: Bilateral upper extremity supported, During functional activity, Reliant on assistive device for balance Standing balance-Leahy Scale: Poor                               Pertinent Vitals/Pain Pain Assessment Pain Assessment: Faces Faces Pain Scale: No hurt Pain Intervention(s): Monitored during session    Home Living Family/patient expects to be discharged to:: Skilled nursing facility Living Arrangements: Alone                 Additional Comments: Pt refuses to participate in home environment interview stating "don't worry about it."    Prior Function Prior Level of Function : Patient poor historian/Family not available             Mobility Comments: patient reports not using AD to ambulate but unreliable historian due to impaired cognition ADLs Comments: Reports indep, however by pt appearance and per family in chart review he has not been  consistently completing self care tasks     Hand Dominance   Dominant Hand: Right    Extremity/Trunk Assessment   Upper Extremity Assessment Upper Extremity Assessment: Overall WFL for tasks assessed;Generalized weakness    Lower Extremity Assessment Lower Extremity Assessment: Generalized weakness;Difficult to assess due to impaired cognition    Cervical / Trunk Assessment Cervical / Trunk Assessment: Kyphotic  Communication   Communication: No difficulties  Cognition Arousal/Alertness: Awake/alert Behavior During Therapy: Flat affect, Agitated, Impulsive Overall Cognitive Status: No family/caregiver present to determine baseline cognitive functioning Area of Impairment: Orientation, Attention, Memory, Safety/judgement, Awareness, Problem solving                 Orientation Level: Disoriented to, Time, Situation Current Attention Level: Sustained Memory:  Decreased short-term memory   Safety/Judgement: Decreased awareness of safety, Decreased awareness of deficits Awareness: Intellectual Problem Solving: Slow processing, Decreased initiation, Difficulty sequencing, Requires verbal cues General Comments: Easily agitated with questions.        General Comments      Exercises     Assessment/Plan    PT Assessment Patient needs continued PT services  PT Problem List Decreased strength;Decreased activity tolerance;Decreased balance;Decreased mobility;Decreased coordination;Decreased cognition;Decreased safety awareness;Decreased knowledge of use of DME;Decreased knowledge of precautions       PT Treatment Interventions DME instruction;Gait training;Functional mobility training;Therapeutic activities;Therapeutic exercise;Balance training;Patient/family education    PT Goals (Current goals can be found in the Care Plan section)  Acute Rehab PT Goals Patient Stated Goal: "I want to go home and not be bothered any more" PT Goal Formulation: Patient unable to participate in goal setting Time For Goal Achievement: 05/14/22 Potential to Achieve Goals: Fair    Frequency Min 2X/week     Co-evaluation               AM-PAC PT "6 Clicks" Mobility  Outcome Measure Help needed turning from your back to your side while in a flat bed without using bedrails?: A Little Help needed moving from lying on your back to sitting on the side of a flat bed without using bedrails?: A Little Help needed moving to and from a bed to a chair (including a wheelchair)?: A Lot Help needed standing up from a chair using your arms (e.g., wheelchair or bedside chair)?: A Lot  Help needed to walk in hospital room?: A Lot Help needed climbing 3-5 steps with a railing? : Total 6 Click Score: 13    End of Session   Activity Tolerance: Patient tolerated treatment well;No increased pain Patient left: with call bell/phone within reach;in bed;with bed alarm  set Nurse Communication: Mobility status PT Visit Diagnosis: Unsteadiness on feet (R26.81);Muscle weakness (generalized) (M62.81);History of falling (Z91.81);Difficulty in walking, not elsewhere classified (R26.2)    Time: 0867-6195 PT Time Calculation (min) (ACUTE ONLY): 12 min   Charges:   PT Evaluation $PT Eval Moderate Complexity: 1 Mod          Coolidge Breeze, PT, DPT [facility] Rehabilitation Department Office: [phone] Pager: [phone]  Coolidge Breeze 04/30/2022, 4:10 PM

## 2022-04-30 NOTE — Consult Note (Addendum)
I have independently evaluated the patient during a face-to-face assessment on 04/30/22. I reviewed the patient's chart, and I participated in key portions of the service. I discussed the case with the [employer], and I agree with the assessment and plan of care as documented in the House Officer's note.   Have made minor edits to note below. Patient lacks dispositional capacity at this time; cannot engage in any sort of discussion of r/b/se of discharge home vs SNF. Repeats same points multiple times without responding to prompts or answering questions about risks. Per bedside nurse pt with extremely poor short term memory - asking for help to get to bathroom and forgetting why he is there. Doubt capacity can be restored in this pt with dementia at baseline. It is worth noting that when a pt lacks capacity, next step is to seek out surrogate decision maker to relate out what decision pt would likely have made if he did have capacity - it is not the hospital's role to unilaterally override pt's stated wishes.    I personally spent 25 minutes on the unit in direct patient care. The direct patient care time included face-to-face time with the patient, reviewing the patient's chart, communicating with other professionals, and coordinating care. Greater than 50% of this time was spent in counseling or coordinating care with the patient regarding goals of hospitalization, psycho-education, and discharge planning needs.   Psychiatry is signing off, no role for ongoing consultation.   Jeral Fruit, MD   Decision being assessed: Dispo- home versus SNF  In an evaluation of capacity, each of the following criteria must be met based for a patient to have capacity to make the decision in question.   Criterion 1: The patient demonstrates a clear and consistent voluntary choice with regard to treatment options. Yes  Criterion 2: The patient adequately understands the disease they have, the treatment  proposed, the risks of treatment, and the risks of other treatment (including no treatment). No  Criterion 3: The patient acknowledges that the details of Criterion 2 apply to them specifically and the likely consequences of treatment options proposed. No  Criterion 4: The patient demonstrates adequate reasoning/rationality within the context of their decision and can provide justification for their choice. No  In this case, the patient does not have capacity to decide his dispo - meets only criterion 1.   See patient interview for details. Of note, this capacity evaluation assesses only for the specified decision documented above at the time of the assessment and is not a substitute for determination of the patient's overall competency, which can only be adjudicated. Other decisions can be assessed by the primary team using the framework above; please reconsult psychiatry only if there is some doubt after attempting initial capacity consult.    Interview- Patient is able to recall he is in the hospital after a fall.  However, patient endorses that "being found on the floor" is not a problem.  Patient is not able to talk about why his predicament was dangerous even when provider discusses implications of having a fall at his age.  Patient remains adamant that he is going home and does not want to go to "a nursing home."  Patient repeats the same mantra at least 7 times during short assessment: "I was in the [employer] for 21 years, 10 months and 14 days.  I have 3 brothers and 4 sisters, we all live around each other.  They are making a mountain out of a  mole hill.  I was found on the ground after waxing the floor, I just slipped and fell.  I want them to come pick me up and take me home so I can live my life that I always have."  Patient does not really wish to answer most questions despite provider trying to clarify the dangers of having to follow patient age, and the purpose of going to a rehab facility.   Patient continues to answer I "do not know "and "nobody told me anything."  Objectively, patient is noted to cuss multiple times throughout assessment. Rather than attempt to answer questions he prefers to just continue to say the same mantra (that is exactly the same every time) over and over again and endorses irritability about provider's questions.  Patient was also noted to make the motion for money in his hand insinuating this a.m. to rehab was just Scribe.  Patient was never able to endorse reasons why providers and family would want him to go into rehab.  Dr. Lovette Cliche, attending psychiatrist Initial interview done by Dr. Candie Chroman PGY-2

## 2022-04-30 NOTE — Progress Notes (Signed)
PROGRESS NOTE    Keith Pittman  JSH:[id number] DOB: Nov 30, 1931 DOA: 04/28/2022 PCP: Lavone Orn, MD   Brief Narrative: 88 year old with past medical history significant for colon cancer treated with surgery, type 2 diabetes, hypothyroidism, subdural hematoma recent admission from home and discharged to SNF due to UTI and metabolic encephalopathy and a prior history of alcohol abuse.  He was recently discharged back home from rehab after family member took him out.  Over the last few days patient is not able to take care of himself.  Unable to walk and do his ADLs.  Evaluation in the ED he was found to have UTI, UA with positive nitrates large leukocytes.  CT head without contrast showed stable small chronic bilateral subdural hematoma. Pelvis x ray negative.    Assessment & Plan:   Principal Problem:   Failure to thrive in adult Active Problems:   Subdural hematoma (HCC)   Lower urinary tract infectious disease   Hypothyroidism   Hypercalcemia   Hyperproteinemia   Dementia (HCC)   1-FTT:  In setting dementia nd UTI.  PT eval.   2-Dementia; continue with vitamins.  Haldol PRN for agitation.  Psych eval for capacity. Patient lack capacity.  He likely need placement.   UTI;  Continue with IV ceftriaxone.  Follow urine culture. Growing citrobacter. Sensitive to ceftriaxone.    Hypothyroidism: TSH normal.   Hypercalcemia : Continue with IV fluids Resolved.   Chronic subdural hematoma; stable.        Pressure Injury 09/29/19 Sacrum Deep Tissue Injury - Purple or maroon localized area of discolored intact skin or blood-filled blister due to damage of underlying soft tissue from pressure and/or shear. (Active)  09/29/19 0030  Location: Sacrum  Location Orientation:   Staging: Deep Tissue Injury - Purple or maroon localized area of discolored intact skin or blood-filled blister due to damage of underlying soft tissue from pressure and/or shear.  Wound Description  (Comments):   Present on Admission:                   Estimated body mass index is 28.93 kg/m as calculated from the following:   Height as of this encounter: '5\' 11"'$  (1.803 m).   Weight as of this encounter: 94.1 kg.   DVT prophylaxis: Lovenox Code Status: Full code Family Communication: No family at bedside.  Disposition Plan:  Status is: Inpatient Remains inpatient appropriate because: remain inpatient for management UTI    Consultants:  Psych  Procedures:  none  Antimicrobials:    Subjective: He denies pain    Objective: Vitals:   04/29/22 1336 04/29/22 2140 04/30/22 0603 04/30/22 1318  BP: (!) 118/49 116/63 (!) 102/54 115/82  Pulse: 64 62 (!) 54 64  Resp: '17 16  19  '$ Temp: (!) 97.5 F (36.4 C) 97.8 F (36.6 C) 98.2 F (36.8 C) 97.9 F (36.6 C)  TempSrc: Oral Oral Oral Oral  SpO2: 99% 100% 95% 97%  Weight:      Height:        Intake/Output Summary (Last 24 hours) at 04/30/2022 1443 Last data filed at 04/30/2022 1100 Gross per 24 hour  Intake 4309.77 ml  Output 1400 ml  Net 2909.77 ml    Filed Weights   04/28/22 1127  Weight: 94.1 kg    Examination:  General exam: NAD Respiratory system: CTA Cardiovascular system: S, 1, S 2 RRR Gastrointestinal system: BS present, soft, nt Central nervous system: alert, follows command.  Extremities: symmetric power   Data  Reviewed: I have personally reviewed following labs and imaging studies  CBC: Recent Labs  Lab 04/28/22 1148 04/29/22 0454  WBC 6.5 9.0  NEUTROABS 5.1  --   HGB 15.3 11.8*  HCT 46.8 36.2*  MCV 94.2 94.0  PLT 197 448    Basic Metabolic Panel: Recent Labs  Lab 04/28/22 1148 04/29/22 0454  NA 141 141  K 4.3 4.0  CL 107 113*  CO2 24 23  GLUCOSE 136* 111*  BUN 21 17  CREATININE 1.05 0.88  CALCIUM 10.7* 9.5    GFR: Estimated Creatinine Clearance: 69.3 mL/min (by C-G formula based on SCr of 0.88 mg/dL). Liver Function Tests: Recent Labs  Lab 04/28/22 1148  04/29/22 0454  AST 20 18  ALT 11 8  ALKPHOS 67 52  BILITOT 1.1 1.2  PROT 8.7* 6.6  ALBUMIN 4.1 3.1*    Recent Labs  Lab 04/28/22 1148  LIPASE 21    No results for input(s): AMMONIA in the last 168 hours. Coagulation Profile: No results for input(s): INR, PROTIME in the last 168 hours. Cardiac Enzymes: Recent Labs  Lab 04/28/22 1148  CKTOTAL 57    BNP (last 3 results) No results for input(s): PROBNP in the last 8760 hours. HbA1C: No results for input(s): HGBA1C in the last 72 hours. CBG: No results for input(s): GLUCAP in the last 168 hours. Lipid Profile: No results for input(s): CHOL, HDL, LDLCALC, TRIG, CHOLHDL, LDLDIRECT in the last 72 hours. Thyroid Function Tests: Recent Labs    04/29/22 0454  TSH 2.538    Anemia Panel: No results for input(s): VITAMINB12, FOLATE, FERRITIN, TIBC, IRON, RETICCTPCT in the last 72 hours. Sepsis Labs: No results for input(s): PROCALCITON, LATICACIDVEN in the last 168 hours.  Recent Results (from the past 240 hour(s))  SARS Coronavirus 2 by RT PCR (hospital order, performed in [facility] hospital lab) *cepheid single result test* Anterior Nasal Swab     Status: None   Collection Time: 04/28/22 12:32 PM   Specimen: Anterior Nasal Swab  Result Value Ref Range Status   SARS Coronavirus 2 by RT PCR NEGATIVE NEGATIVE Final    Comment: (NOTE) SARS-CoV-2 target nucleic acids are NOT DETECTED.  The SARS-CoV-2 RNA is generally detectable in upper and lower respiratory specimens during the acute phase of infection. The lowest concentration of SARS-CoV-2 viral copies this assay can detect is 250 copies / mL. A negative result does not preclude SARS-CoV-2 infection and should not be used as the sole basis for treatment or other patient management decisions.  A negative result may occur with improper specimen collection / handling, submission of specimen other than nasopharyngeal swab, presence of viral mutation(s) within the areas  targeted by this assay, and inadequate number of viral copies (<250 copies / mL). A negative result must be combined with clinical observations, patient history, and epidemiological information.  Fact Sheet for Patients:   https://www.patel.info/  Fact Sheet for Healthcare Providers: https://hall.com/  This test is not yet approved or  cleared by the [place] FDA and has been authorized for detection and/or diagnosis of SARS-CoV-2 by FDA under an Emergency Use Authorization (EUA).  This EUA will remain in effect (meaning this test can be used) for the duration of the COVID-19 declaration under Section 564(b)(1) of the Act, 21 U.S.C. section 360bbb-3(b)(1), unless the authorization is terminated or revoked sooner.  Performed at [facility], [place] [address] [place], [place] [address]   Urine Culture     Status: Abnormal  Collection Time: 04/28/22  1:08 PM   Specimen: Urine, Clean Catch  Result Value Ref Range Status   Specimen Description   Final    URINE, CLEAN CATCH Performed at [facility], [place] [address], [place], [place] [address]    Special Requests   Final    NONE Performed at [facility], [place] [address], [place], [place] [address]    Culture >=100,000 COLONIES/mL CITROBACTER KOSERI (A)  Final   Report Status 04/30/2022 FINAL  Final   Organism ID, Bacteria CITROBACTER KOSERI (A)  Final      Susceptibility   Citrobacter koseri - MIC*    CEFAZOLIN >=64 RESISTANT Resistant     CEFEPIME <=0.12 SENSITIVE Sensitive     CEFTRIAXONE <=0.25 SENSITIVE Sensitive     CIPROFLOXACIN <=0.25 SENSITIVE Sensitive     GENTAMICIN <=1 SENSITIVE Sensitive     IMIPENEM 0.5 SENSITIVE Sensitive     NITROFURANTOIN 64 INTERMEDIATE Intermediate     TRIMETH/SULFA <=20 SENSITIVE Sensitive     PIP/TAZO <=4 SENSITIVE Sensitive     * >=100,000 COLONIES/mL CITROBACTER KOSERI           Radiology Studies: No results found.      Scheduled Meds:  divalproex  250 mg Oral QHS   enoxaparin (LOVENOX) injection  40 mg Subcutaneous B01B   folic acid  1 mg Oral Daily   multivitamin with minerals  1 tablet Oral Daily   thiamine  100 mg Oral Daily   vitamin B-12  1,000 mcg Oral Daily   Continuous Infusions:  sodium chloride Stopped (04/30/22 1306)   cefTRIAXone (ROCEPHIN)  IV 1 g (04/30/22 1000)     LOS: 2 days    Time spent: 35 minutes    Amyria Komar A Hermione Havlicek, MD [facility]   If 7PM-7AM, please contact night-coverage [email or web address]  04/30/2022, 2:43 PM

## 2022-05-01 DIAGNOSIS — R627 Adult failure to thrive: Secondary | ICD-10-CM | POA: Diagnosis not present

## 2022-05-01 NOTE — [place] FL2 (Signed)
[place] MEDICAID FL2 LEVEL OF CARE SCREENING TOOL     IDENTIFICATION  Patient Name: Keith Pittman Birthdate: Jul 08, 1932 Sex: male Admission Date (Current Location): 04/28/2022  [facility] and [place] Number:  Herbalist and Address:  [facility],  [place], [place]      Provider Number: [id number]  Attending Physician Name and Address:  Elmarie Shiley, MD  Relative Name and Phone Number:  [name] (sister/HCPOA) [phone]    Current Level of Care: Hospital Recommended Level of Care: [place] Prior Approval Number:    Date Approved/Denied:   PASRR Number: [id number]  Discharge Plan: SNF    Current Diagnoses: Patient Active Problem List   Diagnosis Date Noted   Failure to thrive in adult 04/28/2022   Hypercalcemia 04/28/2022   Hyperproteinemia 04/28/2022   Dementia ([place]) 04/28/2022   Encephalopathy 89/38/1017   Acute metabolic encephalopathy 51/12/5850   Hypothyroidism 04/06/2022   Altered mental status    Encounter for behavioral health screening 10/02/2019   Pressure injury of skin 09/29/2019   Lower urinary tract infectious disease 09/28/2019   Unwitnessed fall 02/02/2019   Rhabdomyolysis 02/02/2019   Elevated LFTs 02/02/2019   Alcohol use 02/02/2019   Subdural hematoma ([place]) 02/01/2019    Orientation RESPIRATION BLADDER Height & Weight     Self, Place  Normal Incontinent, External catheter Weight: 207 lb 7.3 oz (94.1 kg) (per record) Height:  '5\' 11"'$  (180.3 cm)  BEHAVIORAL SYMPTOMS/MOOD NEUROLOGICAL BOWEL NUTRITION STATUS  Verbally abusive   Incontinent (External Catheter) Diet (Normal)  AMBULATORY STATUS COMMUNICATION OF NEEDS Skin   Extensive Assist Verbally Normal                       Personal Care Assistance Level of Assistance  Bathing, Feeding, Dressing Bathing Assistance: Maximum assistance Feeding assistance: Limited assistance Dressing Assistance: Maximum  assistance Total Care Assistance: Maximum assistance   Functional Limitations Info  Sight, Hearing, Speech Sight Info: Adequate Hearing Info: Adequate Speech Info: Adequate    SPECIAL CARE FACTORS FREQUENCY  PT (By licensed PT), OT (By licensed OT)     PT Frequency: 5x/wk OT Frequency: 5x/wk            Contractures Contractures Info: Not present    Additional Factors Info  Code Status, Allergies, Psychotropic Code Status Info: Full Allergies Info: Sulfa Antibiotics Psychotropic Info: See MAR         Current Medications (05/01/2022):  This is the current hospital active medication list Current Facility-Administered Medications  Medication Dose Route Frequency Provider Last Rate Last Admin   acetaminophen (TYLENOL) tablet 650 mg  650 mg Oral Q6H PRN Reubin Milan, MD   650 mg at 04/29/22 0157   Or   acetaminophen (TYLENOL) suppository 650 mg  650 mg Rectal Q6H PRN Reubin Milan, MD       cefTRIAXone (ROCEPHIN) 1 g in sodium chloride 0.9 % 100 mL IVPB  1 g Intravenous Q24H Reubin Milan, MD 200 mL/hr at 04/30/22 1000 1 g at 04/30/22 1000   divalproex (DEPAKOTE) DR tablet 250 mg  250 mg Oral QHS Reubin Milan, MD   250 mg at 04/28/22 2148   enoxaparin (LOVENOX) injection 40 mg  40 mg Subcutaneous Q24H Regalado, Belkys A, MD   40 mg at 77/82/42 3536   folic acid (FOLVITE) tablet 1 mg  1 mg Oral Daily Reubin Milan, MD   1 mg at 04/30/22 1000  haloperidol lactate (HALDOL) injection 3 mg  3 mg Intravenous Q6H PRN Reubin Milan, MD       multivitamin with minerals tablet 1 tablet  1 tablet Oral Daily Reubin Milan, MD   1 tablet at 04/30/22 1000   ondansetron [facility]) tablet 4 mg  4 mg Oral Q6H PRN Reubin Milan, MD       Or   ondansetron [facility]) injection 4 mg  4 mg Intravenous Q6H PRN Reubin Milan, MD       thiamine tablet 100 mg  100 mg Oral Daily Reubin Milan, MD   100 mg at 04/30/22 1000   vitamin B-12  (CYANOCOBALAMIN) tablet 1,000 mcg  1,000 mcg Oral Daily Regalado, Belkys A, MD   1,000 mcg at 04/30/22 1000     Discharge Medications: Please see discharge summary for a list of discharge medications.  Relevant Imaging Results:  Relevant Lab Results:   Additional Information SS#: [place], LCSW

## 2022-05-01 NOTE — Care Management Important Message (Signed)
Important Message  Patient Details IM Letter placed in Patients room. Name: Keith Pittman MRN: [id number] Date of Birth: 22-Apr-1932   Medicare Important Message Given:  Yes     Kerin Salen 05/01/2022, 11:08 AM

## 2022-05-01 NOTE — TOC Initial Note (Addendum)
Transition of Care [facility]) - Initial/Assessment Note    Patient Details  Name: Keith Pittman MRN: [id number] Date of Birth: 1932-08-11  [facility] of [facility]) CM/SW Contact:    Vassie Moselle, LCSW Phone Number: 05/01/2022, 9:13 AM  Clinical Narrative:                 Spoke with pt's sister/HCPOA and discussed recommendations for SNF placement for this pt. Pt's sister is agreeable to SNF placement and has no facility preference at this time. Pt has been faxed out to multiple facilities and are now awaiting bed offers. CSW will follow up with pt's sister with bed offers and further discharge planning for this pt as pt currently lacks decision making capacity.   1300: CSW left voicemail for pt's sister to discuss current bed offers for SNF placement. CSW also plans to discuss guardianship process and seeking LTC for this pt in the future.   1400: CSW attempted to reach pt's HCPOA regarding bed offers for SNF placement.  1500: Continued to try to reach pt's sister/HCPOA to discuss bed offers for placement. Pt's sister has not returned call and has not answered phone calls.   Expected Discharge Plan: Skilled Nursing Facility Barriers to Discharge: Continued Medical Work up   Patient Goals and CMS Choice Patient states their goals for this hospitalization and ongoing recovery are:: To go home   Choice offered to / list presented to : [facility] POA / Guardian, Sibling  Expected Discharge Plan and Services Expected Discharge Plan: [place] In-house Referral: Clinical Social Work Discharge Planning Services: CM Consult Post Acute Care Choice: [place] Living arrangements for the past 2 months: Single Family Home                 DME Arranged: N/A                    Prior Living Arrangements/Services Living arrangements for the past 2 months: Single Family Home Lives with:: Self Patient language and need for interpreter reviewed:: Yes Do you feel  safe going back to the place where you live?: Yes      Need for Family Participation in Patient Care: Yes (Comment) (Sister is HCPOA; pt lacks decision making capacity) Care giver support system in place?: No (comment) Current home services: DME Criminal Activity/Legal Involvement Pertinent to Current Situation/Hospitalization: No - Comment as needed  Activities of Daily Living Home Assistive Devices/Equipment: None ADL Screening (condition at time of admission) Patient's cognitive ability adequate to safely complete daily activities?: No Is the patient deaf or have difficulty hearing?: No Does the patient have difficulty seeing, even when wearing glasses/contacts?: No Does the patient have difficulty concentrating, remembering, or making decisions?: Yes Patient able to express need for assistance with ADLs?: Yes Does the patient have difficulty dressing or bathing?: Yes Independently performs ADLs?: No Communication: Independent Dressing (OT): Needs assistance Is this a change from baseline?: Pre-admission baseline Grooming: Needs assistance Is this a change from baseline?: Pre-admission baseline Feeding: Needs assistance Is this a change from baseline?: Pre-admission baseline Bathing: Needs assistance Is this a change from baseline?: Pre-admission baseline Toileting: Needs assistance Is this a change from baseline?: Pre-admission baseline In/Out Bed: Dependent Is this a change from baseline?: Pre-admission baseline Walks in Home: Dependent Is this a change from baseline?: Pre-admission baseline Does the patient have difficulty walking or climbing stairs?: Yes Weakness of Legs: Both Weakness of Arms/Hands: Both  Permission Sought/Granted Permission sought to share  information with : Family Supports, Chartered certified accountant granted to share information with : Yes, Verbal Permission Granted  Share Information with NAME: Armanda Heritage     Permission granted to  share info w Relationship: Sister/HCPOA  Permission granted to share info w Contact Information: [phone]  Emotional Assessment Appearance:: Appears older than stated age Attitude/Demeanor/Rapport: Aggressive (Verbally and/or physically), Irrational, Uncooperative Affect (typically observed): Agitated Orientation: : Oriented to Self, Oriented to Place Alcohol / Substance Use: Not Applicable Psych Involvement: No (comment)  Admission diagnosis:  Lower urinary tract infectious disease [N39.0] Failure to thrive in adult [R62.7] Patient Active Problem List   Diagnosis Date Noted   Failure to thrive in adult 04/28/2022   Hypercalcemia 04/28/2022   Hyperproteinemia 04/28/2022   Dementia ([place]) 04/28/2022   Encephalopathy 76/54/6503   Acute metabolic encephalopathy 54/65/6812   Hypothyroidism 04/06/2022   Altered mental status    Encounter for behavioral health screening 10/02/2019   Pressure injury of skin 09/29/2019   Lower urinary tract infectious disease 09/28/2019   Unwitnessed fall 02/02/2019   Rhabdomyolysis 02/02/2019   Elevated LFTs 02/02/2019   Alcohol use 02/02/2019   Subdural hematoma ([place]) 02/01/2019   PCP:  Lavone Orn, MD Pharmacy:   [id number] [place] [address] [address]Phone: [phone]: [phone]    Social Determinants of Health (SDOH) Interventions    Readmission Risk Interventions    09/30/2019    3:55 PM  Readmission Risk Prevention Plan  Post Dischage Appt Not Complete  Appt Comments plan for SNF  Medication Screening Complete  Transportation Screening Complete

## 2022-05-01 NOTE — Evaluation (Signed)
Occupational Therapy Evaluation Patient Details Name: Keith Pittman MRN: [id number] DOB: Jun 05, 1932 Today's Date: 05/01/2022   History of Present Illness 88 year old with past medical history significant for colon cancer treated with surgery, type 2 diabetes, hypothyroidism, subdural hematoma recent admission from home and discharged to SNF due to UTI and metabolic encephalopathy and a prior history of alcohol abuse.  He was recently discharged back home from rehab after family member took him out.  Over the last few days patient is not able to take care of himself.  Unable to walk and do his ADLs.  [place] admitted for failure to thrive.  CT head without contrast showed stable small chronic bilateral subdural hematoma. Seen by psychiatry who reports lacks capacity.   Clinical Impression   Keith Pittman is an 88 year old man who presents with above medical history. On evaluation he is overall min guard with RW to ambulate and min assist for ADLs. He needs verbal cues for safety and supervision for all tasks. His mostly limited by his impaired cognition. He lacks awareness of safety concerns or insight in to his own deficits and predicament. He is irritable with therapist when she asks him questions or asks him to get out of the bed. He is not agreeable to any ADLs. Patient will benefit from skilled OT services while in hospital to improve deficits and learn compensatory strategies as needed in order to improve safety and independence. Patient is not safe to live alone. He requires near 24/7 supervision.      Recommendations for follow up therapy are one component of a multi-disciplinary discharge planning process, led by the attending physician.  Recommendations may be updated based on patient status, additional functional criteria and insurance authorization.   Follow Up Recommendations  Skilled nursing-short term rehab (<3 hours/day)    Assistance Recommended at Discharge Frequent or constant  Supervision/Assistance  Patient can return home with the following A little help with walking and/or transfers;A little help with bathing/dressing/bathroom;Assistance with cooking/housework;Direct supervision/assist for financial management;Direct supervision/assist for medications management;Help with stairs or ramp for entrance;Assist for transportation    Functional Status Assessment  Patient has had a recent decline in their functional status and demonstrates the ability to make significant improvements in function in a reasonable and predictable amount of time.  Equipment Recommendations  Other (comment) (TBD)    Recommendations for Other Services       Precautions / Restrictions Precautions Precautions: Fall Precaution Comments: Pt had recent fall at home Restrictions Weight Bearing Restrictions: No      Mobility Bed Mobility                    Transfers                          Balance Overall balance assessment: Needs assistance Sitting-balance support: Feet supported, No upper extremity supported Sitting balance-Leahy Scale: Good     Standing balance support: Bilateral upper extremity supported, During functional activity, Reliant on assistive device for balance Standing balance-Leahy Scale: Poor                             ADL either performed or assessed with clinical judgement   ADL Overall ADL's : Needs assistance/impaired Eating/Feeding: Independent   Grooming: Supervision/safety;Standing;Cueing for safety   Upper Body Bathing: Supervision/ safety;Cueing for safety   Lower Body Bathing: Minimal assistance;Sit to/from stand;Cueing for safety  Upper Body Dressing : Supervision/safety;Sitting;Set up   Lower Body Dressing: Sit to/from stand;Minimal assistance   Toilet Transfer: Minimal assistance;Rolling walker (2 wheels);Grab bars;Cueing for safety   Toileting- Clothing Manipulation and Hygiene: Minimal assistance;Sit  to/from stand       Functional mobility during ADLs: Minimal assistance;Rolling walker (2 wheels) General ADL Comments: Reliant on walker for ambulation in hall. Required verbal cues for hand placement and safety.     Vision   Vision Assessment?: No apparent visual deficits     Perception     Praxis      Pertinent Vitals/Pain Pain Assessment Pain Assessment: No/denies pain     Hand Dominance Right   Extremity/Trunk Assessment Upper Extremity Assessment Upper Extremity Assessment: Overall WFL for tasks assessed   Lower Extremity Assessment Lower Extremity Assessment: Defer to PT evaluation   Cervical / Trunk Assessment Cervical / Trunk Assessment: Kyphotic   Communication Communication Communication: No difficulties   Cognition Arousal/Alertness: Awake/alert Behavior During Therapy: Flat affect Overall Cognitive Status: No family/caregiver present to determine baseline cognitive functioning                                 General Comments: Irritable. Not overly participatory.     General Comments       Exercises     Shoulder Instructions      Home Living Family/patient expects to be discharged to:: Skilled nursing facility Living Arrangements: Alone                               Additional Comments: Pt refuses to participate in home environment interview stating "don't worry about it."      Prior Functioning/Environment Prior Level of Function : Patient poor historian/Family not available             Mobility Comments: patient reports not using AD to ambulate but unreliable historian due to impaired cognition ADLs Comments: Reports indep, however by pt appearance and per family in chart review he has not been  consistently completing self care tasks        OT Problem List: Impaired balance (sitting and/or standing);Decreased activity tolerance;Decreased cognition;Decreased safety awareness;Decreased knowledge of use of  DME or AE      OT Treatment/Interventions: Self-care/ADL training;Energy conservation;DME and/or AE instruction;Therapeutic activities;Cognitive remediation/compensation;Patient/family education;Balance training    OT Goals(Current goals can be found in the care plan section) Acute Rehab OT Goals OT Goal Formulation: Patient unable to participate in goal setting Time For Goal Achievement: 05/15/22 Potential to Achieve Goals: Good  OT Frequency: Min 2X/week    Co-evaluation              AM-PAC OT "6 Clicks" Daily Activity     Outcome Measure Help from another person eating meals?: None Help from another person taking care of personal grooming?: A Little Help from another person toileting, which includes using toliet, bedpan, or urinal?: A Little Help from another person bathing (including washing, rinsing, drying)?: A Little Help from another person to put on and taking off regular upper body clothing?: A Little Help from another person to put on and taking off regular lower body clothing?: A Little 6 Click Score: 19   End of Session Equipment Utilized During Treatment: Rolling walker (2 wheels) Nurse Communication: Mobility status  Activity Tolerance: Treatment limited secondary to agitation Patient left: in bed;with call bell/phone within reach;with  bed alarm set  OT Visit Diagnosis: History of falling (Z91.81)                Time: 8185-9093 OT Time Calculation (min): 10 min Charges:  OT General Charges $OT Visit: 1 Visit OT Evaluation $OT Eval Low Complexity: 1 Low  Nikoli Nasser, OTR/L [place]  Office [phone] Pager: [place] 05/01/2022, 3:29 PM

## 2022-05-01 NOTE — Progress Notes (Signed)
PROGRESS NOTE    Keith Pittman  SHF:[id number] DOB: [birth year] DOA: 04/28/2022 PCP: Lavone Orn, MD   Brief Narrative: 88 year old with past medical history significant for colon cancer treated with surgery, type 2 diabetes, hypothyroidism, subdural hematoma recent admission from home and discharged to SNF due to UTI and metabolic encephalopathy and a prior history of alcohol abuse.  He was recently discharged back home from rehab after family member took him out.  Over the last few days patient is not able to take care of himself.  Unable to walk and do his ADLs.  Evaluation in the ED he was found to have UTI, UA with positive nitrates large leukocytes.  CT head without contrast showed stable small chronic bilateral subdural hematoma. Pelvis x ray negative.    Assessment & Plan:   Principal Problem:   Failure to thrive in adult Active Problems:   Subdural hematoma (HCC)   Lower urinary tract infectious disease   Hypothyroidism   Hypercalcemia   Hyperproteinemia   Dementia ([place])   1-FTT:  In setting dementia and UTI.  PT eval. Needs Rehab.   2-Dementia;  Continue with Depakote.  Haldol PRN for agitation.  Psych eval for capacity. Patient lack capacity.  He likely need placement.   UTI;  Continue with IV ceftriaxone.  Follow urine culture. Growing citrobacter. Sensitive to ceftriaxone.  Day 3 antibiotics.   Hypothyroidism: TSH normal.   Hypercalcemia : Continue with IV fluids Resolved.   Chronic subdural hematoma; stable.     Pressure Injury 09/29/19 Sacrum Deep Tissue Injury - Purple or maroon localized area of discolored intact skin or blood-filled blister due to damage of underlying soft tissue from pressure and/or shear. (Active)  09/29/19 0030  Location: Sacrum  Location Orientation:   Staging: Deep Tissue Injury - Purple or maroon localized area of discolored intact skin or blood-filled blister due to damage of underlying soft tissue from pressure and/or  shear.  Wound Description (Comments):   Present on Admission:      Estimated body mass index is 28.93 kg/m as calculated from the following:   Height as of this encounter: '5\' 11"'$  (1.803 m).   Weight as of this encounter: 94.1 kg.   DVT prophylaxis: Lovenox Code Status: Full code Family Communication: No family at bedside.  Disposition Plan:  Status is: Inpatient Remains inpatient appropriate because: remain inpatient for management UTI    Consultants:  Psych  Procedures:  none  Antimicrobials:    Subjective: He is alert, trying to eat breakfast, he needed assistance to open juice. He denies pain.   Objective: Vitals:   04/30/22 1318 04/30/22 2233 05/01/22 0408 05/01/22 1247  BP: 115/82 127/64 123/66 100/60  Pulse: 64 (!) 56 (!) 57 (!) 57  Resp: '19 20 20 16  '$ Temp: 97.9 F (36.6 C) 98.8 F (37.1 C) 98.4 F (36.9 C) 98 F (36.7 C)  TempSrc: Oral Oral Oral Oral  SpO2: 97% 96% 99% 99%  Weight:      Height:        Intake/Output Summary (Last 24 hours) at 05/01/2022 1421 Last data filed at 05/01/2022 1300 Gross per 24 hour  Intake 840 ml  Output 1225 ml  Net -385 ml    Filed Weights   04/28/22 1127  Weight: 94.1 kg    Examination:  General exam: NAD Respiratory system: CTA Cardiovascular system: S 1, S 2 RRR Gastrointestinal system: BS present, soft, nt Central nervous system: Alert, follows command Extremities: Symmetric power   Data  Reviewed: I have personally reviewed following labs and imaging studies  CBC: Recent Labs  Lab 04/28/22 1148 04/29/22 0454  WBC 6.5 9.0  NEUTROABS 5.1  --   HGB 15.3 11.8*  HCT 46.8 36.2*  MCV 94.2 94.0  PLT 197 979    Basic Metabolic Panel: Recent Labs  Lab 04/28/22 1148 04/29/22 0454  NA 141 141  K 4.3 4.0  CL 107 113*  CO2 24 23  GLUCOSE 136* 111*  BUN 21 17  CREATININE 1.05 0.88  CALCIUM 10.7* 9.5    GFR: Estimated Creatinine Clearance: 69.3 mL/min (by C-G formula based on SCr of 0.88  mg/dL). Liver Function Tests: Recent Labs  Lab 04/28/22 1148 04/29/22 0454  AST 20 18  ALT 11 8  ALKPHOS 67 52  BILITOT 1.1 1.2  PROT 8.7* 6.6  ALBUMIN 4.1 3.1*    Recent Labs  Lab 04/28/22 1148  LIPASE 21    No results for input(s): AMMONIA in the last 168 hours. Coagulation Profile: No results for input(s): INR, PROTIME in the last 168 hours. Cardiac Enzymes: Recent Labs  Lab 04/28/22 1148  CKTOTAL 57    BNP (last 3 results) No results for input(s): PROBNP in the last 8760 hours. HbA1C: No results for input(s): HGBA1C in the last 72 hours. CBG: No results for input(s): GLUCAP in the last 168 hours. Lipid Profile: No results for input(s): CHOL, HDL, LDLCALC, TRIG, CHOLHDL, LDLDIRECT in the last 72 hours. Thyroid Function Tests: Recent Labs    04/29/22 0454  TSH 2.538    Anemia Panel: No results for input(s): VITAMINB12, FOLATE, FERRITIN, TIBC, IRON, RETICCTPCT in the last 72 hours. Sepsis Labs: No results for input(s): PROCALCITON, LATICACIDVEN in the last 168 hours.  Recent Results (from the past 240 hour(s))  SARS Coronavirus 2 by RT PCR (hospital order, performed in [facility] hospital lab) *cepheid single result test* Anterior Nasal Swab     Status: None   Collection Time: 04/28/22 12:32 PM   Specimen: Anterior Nasal Swab  Result Value Ref Range Status   SARS Coronavirus 2 by RT PCR NEGATIVE NEGATIVE Final    Comment: (NOTE) SARS-CoV-2 target nucleic acids are NOT DETECTED.  The SARS-CoV-2 RNA is generally detectable in upper and lower respiratory specimens during the acute phase of infection. The lowest concentration of SARS-CoV-2 viral copies this assay can detect is 250 copies / mL. A negative result does not preclude SARS-CoV-2 infection and should not be used as the sole basis for treatment or other patient management decisions.  A negative result may occur with improper specimen collection / handling, submission of specimen other than  nasopharyngeal swab, presence of viral mutation(s) within the areas targeted by this assay, and inadequate number of viral copies (<250 copies / mL). A negative result must be combined with clinical observations, patient history, and epidemiological information.  Fact Sheet for Patients:   https://www.patel.info/  Fact Sheet for Healthcare Providers: https://hall.com/  This test is not yet approved or  cleared by the [place] FDA and has been authorized for detection and/or diagnosis of SARS-CoV-2 by FDA under an Emergency Use Authorization (EUA).  This EUA will remain in effect (meaning this test can be used) for the duration of the COVID-19 declaration under Section 564(b)(1) of the Act, 21 U.S.C. section 360bbb-3(b)(1), unless the authorization is terminated or revoked sooner.  Performed at [facility], [place] [address] [place], [place] [address]   Urine Culture     Status: Abnormal  Collection Time: 04/28/22  1:08 PM   Specimen: Urine, Clean Catch  Result Value Ref Range Status   Specimen Description   Final    URINE, CLEAN CATCH Performed at [facility] [place] [address] [place], [place] [address]    Special Requests   Final    NONE Performed at [facility], [place] [address], [place], [place] [address]    Culture >=100,000 COLONIES/mL CITROBACTER KOSERI (A)  Final   Report Status 04/30/2022 FINAL  Final   Organism ID, Bacteria CITROBACTER KOSERI (A)  Final      Susceptibility   Citrobacter koseri - MIC*    CEFAZOLIN >=64 RESISTANT Resistant     CEFEPIME <=0.12 SENSITIVE Sensitive     CEFTRIAXONE <=0.25 SENSITIVE Sensitive     CIPROFLOXACIN <=0.25 SENSITIVE Sensitive     GENTAMICIN <=1 SENSITIVE Sensitive     IMIPENEM 0.5 SENSITIVE Sensitive     NITROFURANTOIN 64 INTERMEDIATE Intermediate     TRIMETH/SULFA <=20 SENSITIVE Sensitive     PIP/TAZO <=4 SENSITIVE  Sensitive     * >=100,000 COLONIES/mL CITROBACTER KOSERI          Radiology Studies: No results found.      Scheduled Meds:  divalproex  250 mg Oral QHS   enoxaparin (LOVENOX) injection  40 mg Subcutaneous V40G   folic acid  1 mg Oral Daily   multivitamin with minerals  1 tablet Oral Daily   thiamine  100 mg Oral Daily   vitamin B-12  1,000 mcg Oral Daily   Continuous Infusions:  cefTRIAXone (ROCEPHIN)  IV 1 g (05/01/22 1036)     LOS: 3 days    Time spent: 35 minutes    Maripat Borba A Nasir Bright, MD [facility]   If 7PM-7AM, please contact night-coverage [email or web address]  05/01/2022, 2:21 PM

## 2022-05-02 DIAGNOSIS — R627 Adult failure to thrive: Secondary | ICD-10-CM | POA: Diagnosis not present

## 2022-05-02 LAB — CBC
HCT: 38.7 % — ABNORMAL LOW (ref 39.0–52.0)
Hemoglobin: 12.9 g/dL — ABNORMAL LOW (ref 13.0–17.0)
MCH: 30.6 pg (ref 26.0–34.0)
MCHC: 33.3 g/dL (ref 30.0–36.0)
MCV: 91.9 fL (ref 80.0–100.0)
Platelets: 147 10*3/uL — ABNORMAL LOW (ref 150–400)
RBC: 4.21 MIL/uL — ABNORMAL LOW (ref 4.22–5.81)
RDW: 13.5 % (ref 11.5–15.5)
WBC: 4.3 10*3/uL (ref 4.0–10.5)
nRBC: 0 % (ref 0.0–0.2)

## 2022-05-02 LAB — BASIC METABOLIC PANEL
Anion gap: 11 (ref 5–15)
BUN: 11 mg/dL (ref 8–23)
CO2: 20 mmol/L — ABNORMAL LOW (ref 22–32)
Calcium: 9.9 mg/dL (ref 8.9–10.3)
Chloride: 107 mmol/L (ref 98–111)
Creatinine, Ser: 0.72 mg/dL (ref 0.61–1.24)
GFR, Estimated: >60 mL/min (ref >60)
Glucose, Bld: 99 mg/dL (ref 70–99)
Potassium: 3.7 mmol/L (ref 3.5–5.1)
Sodium: 138 mmol/L (ref 135–145)

## 2022-05-02 MED ORDER — CEFDINIR 300 MG PO CAPS: 300.0000 mg | ORAL_CAPSULE | Freq: Two times a day (BID) | ORAL | 0 refills | Status: AC

## 2022-05-02 MED ORDER — CYANOCOBALAMIN 1000 MCG PO TABS: 1000.0000 ug | ORAL_TABLET | Freq: Every day | ORAL | 0 refills | Status: AC

## 2022-05-02 NOTE — Progress Notes (Signed)
3:30pm-7pm: Assessment findings unchanged from 7am-3pm shift. Patient awaiting transport via ambulance to SNF. Previous RN Apolonio Schneiders reported calling report to facility and completing discharge process. Patient's family at bedside and emotional support provided to them for anxiety related to extended wait for ambulance to arrive. Ivan Anchors, RN 05/02/22 7:42 PM

## 2022-05-02 NOTE — Progress Notes (Addendum)
PT Cancellation Note  Patient Details Name: Keith Pittman MRN: [id number] DOB: 08-23-1932   Cancelled Treatment:    Reason Eval/Treat Not Completed: (P) Patient declined, no reason specified. Pt refusing mobility and declined PT checking back another time. Will follow up another day if pt remains in acute care.  Coolidge Breeze, PT, DPT [facility] Office: [phone] Pager: [phone]  Coolidge Breeze 05/02/2022, 1:51 PM

## 2022-05-02 NOTE — Progress Notes (Signed)
Report called to [facility] from [employer].

## 2022-05-02 NOTE — Discharge Summary (Signed)
Physician Discharge Summary  ONDRA DEBOARD YYT:[id number] DOB: February 18, 1932 DOA: 04/28/2022  PCP: Lavone Orn, MD  Admit date: 04/28/2022 Discharge date: 05/02/2022 Recommendations for Outpatient Follow-up:  Follow up with PCP in 1 weeks-call for appointment Please obtain BMP/CBC in one week  Discharge Dispo: SNF Discharge Condition: Stable Code Status:   Code Status: Full Code Diet recommendation:  Diet Order             Diet Heart Room service appropriate? Yes; Fluid consistency: Thin  Diet effective now                    Brief/Interim Summary: 88 year old with past medical history significant for colon cancer treated with surgery, type 2 diabetes, hypothyroidism, subdural hematoma recent admission from home and discharged to SNF due to UTI and metabolic encephalopathy and a prior history of alcohol abuse.  He was recently discharged back home from rehab after family member took him out.  Over the last few days patient is not able to take care of himself.  Unable to walk and do his ADLs. Evaluation in the ED he was found to have UTI, UA with positive nitrates large leukocytes.  CT head without contrast showed stable small chronic bilateral subdural hematoma. Pelvis x ray negative.Patient was admitted, found to have failure to thrive with recurrence of acute UTI.  He was recently placed in SNF.  However, POA took him out of the facility and he has being alone at home.  He is unable to do ADLs. Lack capacity per psych. Needs placement at this time.    Discharge Diagnoses:  Principal Problem:   Failure to thrive in adult Active Problems:   Subdural hematoma (HCC)   Lower urinary tract infectious disease   Hypothyroidism   Hypercalcemia   Hyperproteinemia   Dementia (HCC)  FTT: In setting dementia and UTI.planning for plecement.  Augment nutritional status   Dementia; moderate stable continue with Depakote.  Haldol PRN for agitation. Psych eval for capacity and he lacks  capacity.  He needs placement.    UTI: With Citrobacter switch to p.o. antibiotic for discharge    Hypothyroidism:TSH normal.    Hypercalcemia :s/p ivf Resolved.  Chronic subdural hematoma; stable.   Consults: Psychiatry Subjective: Alert awake answers question minimally, not interested to have conversation about his question asking why he is cancer, does not give logical explanation or answer despite explaining current patient and asking questions in relation to that   Discharge Exam: Vitals:   05/01/22 1939 05/02/22 0509  BP: (!) 107/53 (!) 174/79  Pulse: (!) 59 (!) 54  Resp: 16 16  Temp: 98.7 F (37.1 C) 97.9 F (36.6 C)  SpO2: 100% 98%   General: Pt is alert, awake, not in acute distress Cardiovascular: RRR, S1/S2 +, no rubs, no gallops Respiratory: CTA bilaterally, no wheezing, no rhonchi Abdominal: Soft, NT, ND, bowel sounds + Extremities: no edema, no cyanosis  Discharge Instructions  Discharge Instructions     Discharge instructions   Complete by: As directed    Please call call MD or return to ER for similar or worsening recurring problem that brought you to hospital or if any fever,nausea/vomiting,abdominal pain, uncontrolled pain, chest pain,  shortness of breath or any other alarming symptoms.  Please follow-up your doctor as instructed in a week time and call the office for appointment.  Please avoid alcohol, smoking, or any other illicit substance and maintain healthy habits including taking your regular medications as prescribed.  You were  cared for by a hospitalist during your hospital stay. If you have any questions about your discharge medications or the care you received while you were in the hospital after you are discharged, you can call the unit and ask to speak with the hospitalist on call if the hospitalist that took care of you is not available.  Once you are discharged, your primary care physician will handle any further medical issues. Please  note that NO REFILLS for any discharge medications will be authorized once you are discharged, as it is imperative that you return to your primary care physician (or establish a relationship with a primary care physician if you do not have one) for your aftercare needs so that they can reassess your need for medications and monitor your lab values   Increase activity slowly   Complete by: As directed       Allergies as of 05/02/2022       Reactions   Sulfa Antibiotics Other (See Comments)   Unknown reaction        Medication List     TAKE these medications    cefdinir 300 MG capsule Commonly known as: OMNICEF Take 1 capsule (300 mg total) by mouth 2 (two) times daily for 3 days.   cyanocobalamin 1000 MCG tablet Take 1 tablet (1,000 mcg total) by mouth daily. Start taking on: May 03, 2022   divalproex 250 MG DR tablet Commonly known as: DEPAKOTE Take 250 mg by mouth at bedtime.   folic acid 1 MG tablet Commonly known as: FOLVITE Take 1 tablet (1 mg total) by mouth daily.   multivitamin with minerals Tabs tablet Take 1 tablet by mouth daily.   thiamine 100 MG tablet Take 1 tablet (100 mg total) by mouth daily.        Follow-up Information     Lavone Orn, MD Follow up in 1 week(s).   Specialty: Internal Medicine Contact information: [address] [place] [address] [phone]                Allergies  Allergen Reactions   Sulfa Antibiotics Other (See Comments)    Unknown reaction     The results of significant diagnostics from this hospitalization (including imaging, microbiology, ancillary and laboratory) are listed below for reference.    Microbiology: Recent Results (from the past 240 hour(s))  SARS Coronavirus 2 by RT PCR (hospital order, performed in [facility] hospital lab) *cepheid single result test* Anterior Nasal Swab     Status: None   Collection Time: 04/28/22 12:32 PM   Specimen: Anterior Nasal Swab  Result  Value Ref Range Status   SARS Coronavirus 2 by RT PCR NEGATIVE NEGATIVE Final    Comment: (NOTE) SARS-CoV-2 target nucleic acids are NOT DETECTED.  The SARS-CoV-2 RNA is generally detectable in upper and lower respiratory specimens during the acute phase of infection. The lowest concentration of SARS-CoV-2 viral copies this assay can detect is 250 copies / mL. A negative result does not preclude SARS-CoV-2 infection and should not be used as the sole basis for treatment or other patient management decisions.  A negative result may occur with improper specimen collection / handling, submission of specimen other than nasopharyngeal swab, presence of viral mutation(s) within the areas targeted by this assay, and inadequate number of viral copies (<250 copies / mL). A negative result must be combined with clinical observations, patient history, and epidemiological information.  Fact Sheet for Patients:   https://www.patel.info/  Fact  Sheet for Healthcare Providers: https://hall.com/  This test is not yet approved or  cleared by the [place] and has been authorized for detection and/or diagnosis of SARS-CoV-2 by FDA under an Emergency Use Authorization (EUA).  This EUA will remain in effect (meaning this test can be used) for the duration of the COVID-19 declaration under Section 564(b)(1) of the Act, 21 U.S.C. section 360bbb-3(b)(1), unless the authorization is terminated or revoked sooner.  Performed at [facility], [place] [address], [place], [place] [address]   Urine Culture     Status: Abnormal   Collection Time: 04/28/22  1:08 PM   Specimen: Urine, Clean Catch  Result Value Ref Range Status   Specimen Description   Final    URINE, CLEAN CATCH Performed at [facility], [place] [address] [place], [place] [address]    Special Requests   Final    NONE Performed at [facility], [place] [address], [place], [place] [address]    Culture >=100,000 COLONIES/mL CITROBACTER KOSERI (A)  Final   Report Status 04/30/2022 FINAL  Final   Organism ID, Bacteria CITROBACTER KOSERI (A)  Final      Susceptibility   Citrobacter koseri - MIC*    CEFAZOLIN >=64 RESISTANT Resistant     CEFEPIME <=0.12 SENSITIVE Sensitive     CEFTRIAXONE <=0.25 SENSITIVE Sensitive     CIPROFLOXACIN <=0.25 SENSITIVE Sensitive     GENTAMICIN <=1 SENSITIVE Sensitive     IMIPENEM 0.5 SENSITIVE Sensitive     NITROFURANTOIN 64 INTERMEDIATE Intermediate     TRIMETH/SULFA <=20 SENSITIVE Sensitive     PIP/TAZO <=4 SENSITIVE Sensitive     * >=100,000 COLONIES/mL CITROBACTER KOSERI    Procedures/Studies: CT Head Wo Contrast  Result Date: 04/28/2022 CLINICAL DATA:  Head trauma, moderate-severe fall; Neck trauma (Age >= 65y) EXAM: CT HEAD WITHOUT CONTRAST CT CERVICAL SPINE WITHOUT CONTRAST TECHNIQUE: Multidetector CT imaging of the head and cervical spine was performed following the standard protocol without intravenous contrast. Multiplanar CT image reconstructions of the cervical spine were also generated. RADIATION DOSE REDUCTION: This exam was performed according to the departmental dose-optimization program which includes automated exposure control, adjustment of the mA and/or kV according to patient size and/or use of iterative reconstruction technique. COMPARISON:  04/06/2022 FINDINGS: CT HEAD FINDINGS Brain: Persistent small bilateral subdural hematomas overlying the bilateral cerebral convexities remaining slightly higher in attenuation relative to cerebral spinal fluid. Collections measure approximately 6-7 mm in thickness bilaterally, unchanged. No significant mass effect or midline shift. No evidence of acute large territory infarction. No new sites of intracranial hemorrhage. No hydrocephalus. Extensive low-density changes within the periventricular and subcortical white matter compatible with  chronic microvascular ischemic change. Moderate diffuse cerebral volume loss. Vascular: Atherosclerotic calcifications involving the large vessels of the skull base. No unexpected hyperdense vessel. Skull: Normal. Negative for fracture or focal lesion. Sinuses/Orbits: No new/acute finding. Other: None. CT CERVICAL SPINE FINDINGS Alignment: Facet joints are aligned without dislocation or traumatic listhesis. Dens and lateral masses are aligned. Skull base and vertebrae: No acute fracture. No primary bone lesion or focal pathologic process. Soft tissues and spinal canal: No prevertebral fluid or swelling. No visible canal hematoma. Disc levels: Similar degree of mild multilevel cervical spondylosis. Upper chest: Negative. Other: Bilateral carotid atherosclerosis. IMPRESSION: 1. Stable small chronic bilateral subdural hematomas. 2. No new/acute intracranial findings. 3. Chronic microvascular ischemic change and cerebral volume loss. 4. No acute fracture or subluxation of the cervical spine. Electronically Signed  By: Davina Poke D.O.   On: 04/28/2022 12:52   CT Cervical Spine Wo Contrast  Result Date: 04/28/2022 CLINICAL DATA:  Head trauma, moderate-severe fall; Neck trauma (Age >= 65y) EXAM: CT HEAD WITHOUT CONTRAST CT CERVICAL SPINE WITHOUT CONTRAST TECHNIQUE: Multidetector CT imaging of the head and cervical spine was performed following the standard protocol without intravenous contrast. Multiplanar CT image reconstructions of the cervical spine were also generated. RADIATION DOSE REDUCTION: This exam was performed according to the departmental dose-optimization program which includes automated exposure control, adjustment of the mA and/or kV according to patient size and/or use of iterative reconstruction technique. COMPARISON:  04/06/2022 FINDINGS: CT HEAD FINDINGS Brain: Persistent small bilateral subdural hematomas overlying the bilateral cerebral convexities remaining slightly higher in attenuation  relative to cerebral spinal fluid. Collections measure approximately 6-7 mm in thickness bilaterally, unchanged. No significant mass effect or midline shift. No evidence of acute large territory infarction. No new sites of intracranial hemorrhage. No hydrocephalus. Extensive low-density changes within the periventricular and subcortical white matter compatible with chronic microvascular ischemic change. Moderate diffuse cerebral volume loss. Vascular: Atherosclerotic calcifications involving the large vessels of the skull base. No unexpected hyperdense vessel. Skull: Normal. Negative for fracture or focal lesion. Sinuses/Orbits: No new/acute finding. Other: None. CT CERVICAL SPINE FINDINGS Alignment: Facet joints are aligned without dislocation or traumatic listhesis. Dens and lateral masses are aligned. Skull base and vertebrae: No acute fracture. No primary bone lesion or focal pathologic process. Soft tissues and spinal canal: No prevertebral fluid or swelling. No visible canal hematoma. Disc levels: Similar degree of mild multilevel cervical spondylosis. Upper chest: Negative. Other: Bilateral carotid atherosclerosis. IMPRESSION: 1. Stable small chronic bilateral subdural hematomas. 2. No new/acute intracranial findings. 3. Chronic microvascular ischemic change and cerebral volume loss. 4. No acute fracture or subluxation of the cervical spine. Electronically Signed   By: Davina Poke D.O.   On: 04/28/2022 12:52   DG Pelvis 1-2 Views  Result Date: 04/28/2022 CLINICAL DATA:  Fall at home.  Multiple trauma.  Pelvic pain. EXAM: PELVIS - 1-2 VIEW COMPARISON:  04/06/2022 FINDINGS: There is no evidence of pelvic fracture or diastasis. No pelvic bone lesions are seen. IMPRESSION: Negative. Electronically Signed   By: Marlaine Hind M.D.   On: 04/28/2022 12:43   DG Chest 2 View  Result Date: 04/28/2022 CLINICAL DATA:  Failure to thrive. Recurrent falls. Diabetes. Colon carcinoma. EXAM: CHEST - 2 VIEW COMPARISON:   04/06/2022 FINDINGS: The heart size and mediastinal contours are within normal limits. Both lungs are clear. Thoracic spine degenerative changes noted. IMPRESSION: No active cardiopulmonary disease. Electronically Signed   By: Marlaine Hind M.D.   On: 04/28/2022 12:42   DG Pelvis Portable  Result Date: 04/06/2022 CLINICAL DATA:  Sepsis, fell EXAM: PORTABLE PELVIS 1-2 VIEWS COMPARISON:  None Available. FINDINGS: Supine frontal view of the pelvis excludes portions of the left iliac crest by collimation. No acute displaced fractures. The hips are well aligned, with mild symmetrical bilateral hip osteoarthritis. Sacroiliac joints are normal. Soft tissues are unremarkable. IMPRESSION: 1. No acute displaced fracture. 2. Mild symmetrical bilateral hip osteoarthritis. Electronically Signed   By: Randa Ngo M.D.   On: 04/06/2022 18:56   DG Chest Port 1 View  Result Date: 04/06/2022 CLINICAL DATA:  Sepsis EXAM: PORTABLE CHEST 1 VIEW COMPARISON:  None Available. FINDINGS: Single frontal view of the chest demonstrates an unremarkable cardiac silhouette. Blunting of the left lateral costophrenic angle consistent with small effusion. No airspace disease or pneumothorax. No  acute bony abnormalities. IMPRESSION: 1. Trace left pleural effusion. 2. No acute airspace disease. Electronically Signed   By: Randa Ngo M.D.   On: 04/06/2022 18:56   CT HEAD WO CONTRAST (5MM)  Result Date: 04/06/2022 CLINICAL DATA:  Head trauma. Patient found on the floor. Abrasions along the left side of the face and under the left bottom left. EXAM: CT HEAD WITHOUT CONTRAST CT MAXILLOFACIAL WITHOUT CONTRAST CT CERVICAL SPINE WITHOUT CONTRAST TECHNIQUE: Multidetector CT imaging of the head, cervical spine, and maxillofacial structures were performed using the standard protocol without intravenous contrast. Multiplanar CT image reconstructions of the cervical spine and maxillofacial structures were also generated. RADIATION DOSE  REDUCTION: This exam was performed according to the departmental dose-optimization program which includes automated exposure control, adjustment of the mA and/or kV according to patient size and/or use of iterative reconstruction technique. COMPARISON:  09/28/2019 and older studies. FINDINGS: CT HEAD FINDINGS Brain: Small bilateral subdural collections, hypoattenuating but higher in attenuation than CSF. No hyperattenuating subdural fluid. These were present on the prior CT and are without substantial change. No significant mass effect or midline shift. No parenchymal masses. No evidence of an ischemic infarct. Age related ventricular and sulcal enlargement is stable. No hydrocephalus. Patchy bilateral white matter hypoattenuation is also unchanged consistent with advanced chronic microvascular ischemic change. No acute intracranial hemorrhage. Vascular: No hyperdense vessel or unexpected calcification. Skull: Normal. Negative for fracture or focal lesion. Other: None. CT MAXILLOFACIAL FINDINGS Osseous: No fracture or mandibular dislocation. No destructive process. Orbits: Negative. No traumatic or inflammatory finding. Sinuses: Minor left frontal left maxillary sinus polypoid mucosal thickening. Sinuses otherwise clear. Soft tissues: Right sided subcutaneous contusion/edema extending from the temporal region to the right cheek. CT CERVICAL SPINE FINDINGS Alignment: Normal. Skull base and vertebrae: No acute fracture. No primary bone lesion or focal pathologic process. Soft tissues and spinal canal: No prevertebral fluid or swelling. No visible canal hematoma. Disc levels: Mild loss of disc height at C4-C5, C5-C6 and C6-C7 with mild disc bulging and endplate spurring. No evidence of a disc herniation. Upper chest: No acute findings. Mild opacities at left apex, likely scarring. Other: None. IMPRESSION: HEAD CT 1. No acute intracranial abnormalities. 2. Small bilateral chronic subdural hematomas, without significant  change from the CT dated 09/28/2019. No acute intracranial hemorrhage. MAXILLOFACIAL CT 1. No fracture. 2. Right-sided facial soft tissue edema. CERVICAL CT 1. No fracture or acute finding. Electronically Signed   By: Lajean Manes M.D.   On: 04/06/2022 18:31   CT Maxillofacial Wo Contrast  Result Date: 04/06/2022 CLINICAL DATA:  Head trauma. Patient found on the floor. Abrasions along the left side of the face and under the left bottom left. EXAM: CT HEAD WITHOUT CONTRAST CT MAXILLOFACIAL WITHOUT CONTRAST CT CERVICAL SPINE WITHOUT CONTRAST TECHNIQUE: Multidetector CT imaging of the head, cervical spine, and maxillofacial structures were performed using the standard protocol without intravenous contrast. Multiplanar CT image reconstructions of the cervical spine and maxillofacial structures were also generated. RADIATION DOSE REDUCTION: This exam was performed according to the departmental dose-optimization program which includes automated exposure control, adjustment of the mA and/or kV according to patient size and/or use of iterative reconstruction technique. COMPARISON:  09/28/2019 and older studies. FINDINGS: CT HEAD FINDINGS Brain: Small bilateral subdural collections, hypoattenuating but higher in attenuation than CSF. No hyperattenuating subdural fluid. These were present on the prior CT and are without substantial change. No significant mass effect or midline shift. No parenchymal masses. No evidence of an ischemic infarct. Age  related ventricular and sulcal enlargement is stable. No hydrocephalus. Patchy bilateral white matter hypoattenuation is also unchanged consistent with advanced chronic microvascular ischemic change. No acute intracranial hemorrhage. Vascular: No hyperdense vessel or unexpected calcification. Skull: Normal. Negative for fracture or focal lesion. Other: None. CT MAXILLOFACIAL FINDINGS Osseous: No fracture or mandibular dislocation. No destructive process. Orbits: Negative. No  traumatic or inflammatory finding. Sinuses: Minor left frontal left maxillary sinus polypoid mucosal thickening. Sinuses otherwise clear. Soft tissues: Right sided subcutaneous contusion/edema extending from the temporal region to the right cheek. CT CERVICAL SPINE FINDINGS Alignment: Normal. Skull base and vertebrae: No acute fracture. No primary bone lesion or focal pathologic process. Soft tissues and spinal canal: No prevertebral fluid or swelling. No visible canal hematoma. Disc levels: Mild loss of disc height at C4-C5, C5-C6 and C6-C7 with mild disc bulging and endplate spurring. No evidence of a disc herniation. Upper chest: No acute findings. Mild opacities at left apex, likely scarring. Other: None. IMPRESSION: HEAD CT 1. No acute intracranial abnormalities. 2. Small bilateral chronic subdural hematomas, without significant change from the CT dated 09/28/2019. No acute intracranial hemorrhage. MAXILLOFACIAL CT 1. No fracture. 2. Right-sided facial soft tissue edema. CERVICAL CT 1. No fracture or acute finding. Electronically Signed   By: Lajean Manes M.D.   On: 04/06/2022 18:31   CT Cervical Spine Wo Contrast  Result Date: 04/06/2022 CLINICAL DATA:  Head trauma. Patient found on the floor. Abrasions along the left side of the face and under the left bottom left. EXAM: CT HEAD WITHOUT CONTRAST CT MAXILLOFACIAL WITHOUT CONTRAST CT CERVICAL SPINE WITHOUT CONTRAST TECHNIQUE: Multidetector CT imaging of the head, cervical spine, and maxillofacial structures were performed using the standard protocol without intravenous contrast. Multiplanar CT image reconstructions of the cervical spine and maxillofacial structures were also generated. RADIATION DOSE REDUCTION: This exam was performed according to the departmental dose-optimization program which includes automated exposure control, adjustment of the mA and/or kV according to patient size and/or use of iterative reconstruction technique. COMPARISON:   09/28/2019 and older studies. FINDINGS: CT HEAD FINDINGS Brain: Small bilateral subdural collections, hypoattenuating but higher in attenuation than CSF. No hyperattenuating subdural fluid. These were present on the prior CT and are without substantial change. No significant mass effect or midline shift. No parenchymal masses. No evidence of an ischemic infarct. Age related ventricular and sulcal enlargement is stable. No hydrocephalus. Patchy bilateral white matter hypoattenuation is also unchanged consistent with advanced chronic microvascular ischemic change. No acute intracranial hemorrhage. Vascular: No hyperdense vessel or unexpected calcification. Skull: Normal. Negative for fracture or focal lesion. Other: None. CT MAXILLOFACIAL FINDINGS Osseous: No fracture or mandibular dislocation. No destructive process. Orbits: Negative. No traumatic or inflammatory finding. Sinuses: Minor left frontal left maxillary sinus polypoid mucosal thickening. Sinuses otherwise clear. Soft tissues: Right sided subcutaneous contusion/edema extending from the temporal region to the right cheek. CT CERVICAL SPINE FINDINGS Alignment: Normal. Skull base and vertebrae: No acute fracture. No primary bone lesion or focal pathologic process. Soft tissues and spinal canal: No prevertebral fluid or swelling. No visible canal hematoma. Disc levels: Mild loss of disc height at C4-C5, C5-C6 and C6-C7 with mild disc bulging and endplate spurring. No evidence of a disc herniation. Upper chest: No acute findings. Mild opacities at left apex, likely scarring. Other: None. IMPRESSION: HEAD CT 1. No acute intracranial abnormalities. 2. Small bilateral chronic subdural hematomas, without significant change from the CT dated 09/28/2019. No acute intracranial hemorrhage. MAXILLOFACIAL CT 1. No fracture. 2. Right-sided facial  soft tissue edema. CERVICAL CT 1. No fracture or acute finding. Electronically Signed   By: Lajean Manes M.D.   On: 04/06/2022  18:31    Labs: BNP (last 3 results) No results for input(s): "BNP" in the last 8760 hours. Basic Metabolic Panel: Recent Labs  Lab 04/28/22 1148 04/29/22 0454 05/02/22 0549  NA 141 141 138  K 4.3 4.0 3.7  CL 107 113* 107  CO2 24 23 20*  GLUCOSE 136* 111* 99  BUN '21 17 11  '$ CREATININE 1.05 0.88 0.72  CALCIUM 10.7* 9.5 9.9   Liver Function Tests: Recent Labs  Lab 04/28/22 1148 04/29/22 0454  AST 20 18  ALT 11 8  ALKPHOS 67 52  BILITOT 1.1 1.2  PROT 8.7* 6.6  ALBUMIN 4.1 3.1*   Recent Labs  Lab 04/28/22 1148  LIPASE 21   No results for input(s): "AMMONIA" in the last 168 hours. CBC: Recent Labs  Lab 04/28/22 1148 04/29/22 0454 05/02/22 0549  WBC 6.5 9.0 4.3  NEUTROABS 5.1  --   --   HGB 15.3 11.8* 12.9*  HCT 46.8 36.2* 38.7*  MCV 94.2 94.0 91.9  PLT 197 152 147*   Cardiac Enzymes: Recent Labs  Lab 04/28/22 1148  CKTOTAL 57   BNP: Invalid input(s): "POCBNP" CBG: No results for input(s): "GLUCAP" in the last 168 hours. D-Dimer No results for input(s): "DDIMER" in the last 72 hours. Hgb A1c No results for input(s): "HGBA1C" in the last 72 hours. Lipid Profile No results for input(s): "CHOL", "HDL", "LDLCALC", "TRIG", "CHOLHDL", "LDLDIRECT" in the last 72 hours. Thyroid function studies No results for input(s): "TSH", "T4TOTAL", "T3FREE", "THYROIDAB" in the last 72 hours.  Invalid input(s): "FREET3" Anemia work up No results for input(s): "VITAMINB12", "FOLATE", "FERRITIN", "TIBC", "IRON", "RETICCTPCT" in the last 72 hours. Urinalysis    Component Value Date/Time   COLORURINE YELLOW 04/28/2022 1308   APPEARANCEUR HAZY (A) 04/28/2022 1308   LABSPEC 1.013 04/28/2022 1308   PHURINE 5.0 04/28/2022 1308   GLUCOSEU NEGATIVE 04/28/2022 1308   HGBUR SMALL (A) 04/28/2022 1308   BILIRUBINUR NEGATIVE 04/28/2022 1308   KETONESUR 20 (A) 04/28/2022 1308   PROTEINUR 30 (A) 04/28/2022 1308   NITRITE POSITIVE (A) 04/28/2022 1308   LEUKOCYTESUR LARGE (A)  04/28/2022 1308   Sepsis Labs Recent Labs  Lab 04/28/22 1148 04/29/22 0454 05/02/22 0549  WBC 6.5 9.0 4.3   Microbiology Recent Results (from the past 240 hour(s))  SARS Coronavirus 2 by RT PCR (hospital order, performed in [place] hospital lab) *cepheid single result test* Anterior Nasal Swab     Status: None   Collection Time: 04/28/22 12:32 PM   Specimen: Anterior Nasal Swab  Result Value Ref Range Status   SARS Coronavirus 2 by RT PCR NEGATIVE NEGATIVE Final    Comment: (NOTE) SARS-CoV-2 target nucleic acids are NOT DETECTED.  The SARS-CoV-2 RNA is generally detectable in upper and lower respiratory specimens during the acute phase of infection. The lowest concentration of SARS-CoV-2 viral copies this assay can detect is 250 copies / mL. A negative result does not preclude SARS-CoV-2 infection and should not be used as the sole basis for treatment or other patient management decisions.  A negative result may occur with improper specimen collection / handling, submission of specimen other than nasopharyngeal swab, presence of viral mutation(s) within the areas targeted by this assay, and inadequate number of viral copies (<250 copies / mL). A negative result must be combined with clinical observations, patient history, and  epidemiological information.  Fact Sheet for Patients:   https://www.patel.info/  Fact Sheet for Healthcare Providers: https://hall.com/  This test is not yet approved or  cleared by the [place] FDA and has been authorized for detection and/or diagnosis of SARS-CoV-2 by FDA under an Emergency Use Authorization (EUA).  This EUA will remain in effect (meaning this test can be used) for the duration of the COVID-19 declaration under Section 564(b)(1) of the Act, 21 U.S.C. section 360bbb-3(b)(1), unless the authorization is terminated or revoked sooner.  Performed at [facility],  [place] [address] [place], [place] [address]   Urine Culture     Status: Abnormal   Collection Time: 04/28/22  1:08 PM   Specimen: Urine, Clean Catch  Result Value Ref Range Status   Specimen Description   Final    URINE, CLEAN CATCH Performed at [facility], [place] [address] [place], [place] [address]    Special Requests   Final    NONE Performed at [facility], [place] [address], [place], [place] [address]    Culture >=100,000 COLONIES/mL CITROBACTER KOSERI (A)  Final   Report Status 04/30/2022 FINAL  Final   Organism ID, Bacteria CITROBACTER KOSERI (A)  Final      Susceptibility   Citrobacter koseri - MIC*    CEFAZOLIN >=64 RESISTANT Resistant     CEFEPIME <=0.12 SENSITIVE Sensitive     CEFTRIAXONE <=0.25 SENSITIVE Sensitive     CIPROFLOXACIN <=0.25 SENSITIVE Sensitive     GENTAMICIN <=1 SENSITIVE Sensitive     IMIPENEM 0.5 SENSITIVE Sensitive     NITROFURANTOIN 64 INTERMEDIATE Intermediate     TRIMETH/SULFA <=20 SENSITIVE Sensitive     PIP/TAZO <=4 SENSITIVE Sensitive     * >=100,000 COLONIES/mL CITROBACTER KOSERI     Time coordinating discharge: 25 minutes  SIGNED: Antonieta Pert, MD  [facility] 05/02/2022, 11:38 AM  If 7PM-7AM, please contact night-coverage [email or web address]

## 2022-05-02 NOTE — Hospital Course (Addendum)
88 year old with past medical history significant for colon cancer treated with surgery, type 2 diabetes, hypothyroidism, subdural hematoma recent admission from home and discharged to SNF due to UTI and metabolic encephalopathy and a prior history of alcohol abuse.  He was recently discharged back home from rehab after family member took him out.  Over the last few days patient is not able to take care of himself.  Unable to walk and do his ADLs. Evaluation in the ED he was found to have UTI, UA with positive nitrates large leukocytes.  CT head without contrast showed stable small chronic bilateral subdural hematoma. Pelvis x ray negative.Patient was admitted, found to have failure to thrive with recurrence of acute UTI.  He was recently placed in SNF.  However, POA took him out of the facility and he has being alone at home.  He is unable to do ADLs. Lack capacity per psych. Needs placement at this time.

## 2022-05-02 NOTE — Progress Notes (Signed)
Physical Therapy Treatment Patient Details Name: Keith Pittman MRN: [id number] DOB: 09-Mar-1932 Today's Date: 05/02/2022   History of Present Illness 88 year old with past medical history significant for colon cancer treated with surgery, type 2 diabetes, hypothyroidism, subdural hematoma recent admission from home and discharged to SNF due to UTI and metabolic encephalopathy and a prior history of alcohol abuse.  He was recently discharged back home from rehab after family member took him out.  Over the last few days patient is not able to take care of himself.  Unable to walk and do his ADLs.  [place] admitted for failure to thrive.  CT head without contrast showed stable small chronic bilateral subdural hematoma. Seen by psychiatry who reports lacks capacity.    PT Comments    Pt agreeable to therapy with max encouragement. Pt min assist for bed mobility, upon sitting EOB noted linens were wet. Pt completed sit to stand x3 with min assist for lift assist and steadying of RW to allow for linens change and pericare. Pt demonstrated strong posterior lean that required at least min assist to correct and pt was unaware and unable to correct. Ambulation deferred secondary to fatigue. We will continue to follow acutely.    Recommendations for follow up therapy are one component of a multi-disciplinary discharge planning process, led by the attending physician.  Recommendations may be updated based on patient status, additional functional criteria and insurance authorization.  Follow Up Recommendations  Skilled nursing-short term rehab (<3 hours/day)     Assistance Recommended at Discharge Frequent or constant Supervision/Assistance  Patient can return home with the following A lot of help with walking and/or transfers;A lot of help with bathing/dressing/bathroom;Assistance with cooking/housework;Direct supervision/assist for medications management;Direct supervision/assist for financial  management;Assist for transportation;Help with stairs or ramp for entrance   Equipment Recommendations  None recommended by PT (TBD at SNF)    Recommendations for Other Services       Precautions / Restrictions Precautions Precautions: Fall Precaution Comments: Pt had recent fall at home Restrictions Weight Bearing Restrictions: Yes     Mobility  Bed Mobility Overal bed mobility: Needs Assistance Bed Mobility: Supine to Sit     Supine to sit: Min assist Sit to supine: Min assist   General bed mobility comments: Pt min assist for lift assist for trunk, assist for bringing BLE into bed    Transfers Overall transfer level: Needs assistance Equipment used: Rolling walker (2 wheels) Transfers: Sit to/from Stand Sit to Stand: Min assist, From elevated surface           General transfer comment: Min assist to stand from elevated surface to RW x3; dependent on UEs to push up. Pt with heavy posterior lean that he was not able to correct. Pt able to stand for pericare and linen change.    Ambulation/Gait         Gait velocity: deferred         Stairs             Wheelchair Mobility    Modified Rankin (Stroke Patients Only)       Balance Overall balance assessment: Needs assistance Sitting-balance support: Feet supported, No upper extremity supported Sitting balance-Leahy Scale: Good Sitting balance - Comments: requires some form of support to maintain balance Postural control: Posterior lean Standing balance support: Bilateral upper extremity supported, During functional activity, Reliant on assistive device for balance Standing balance-Leahy Scale: Poor Standing balance comment: Pt unable to maintain static stance without min assist to  correct                            Cognition Arousal/Alertness: Awake/alert Behavior During Therapy: Flat affect Overall Cognitive Status: No family/caregiver present to determine baseline cognitive  functioning Area of Impairment: Orientation, Attention, Memory, Safety/judgement, Awareness, Problem solving                 Orientation Level: Disoriented to, Time, Situation Current Attention Level: Sustained Memory: Decreased short-term memory   Safety/Judgement: Decreased awareness of safety, Decreased awareness of deficits Awareness: Intellectual Problem Solving: Slow processing, Decreased initiation, Difficulty sequencing, Requires verbal cues General Comments: sister Hassan Rowan in the room. Reports he can name family members.        Exercises      General Comments        Pertinent Vitals/Pain Pain Assessment Pain Assessment: No/denies pain Faces Pain Scale: No hurt Breathing: normal Negative Vocalization: none Facial Expression: smiling or inexpressive Body Language: relaxed Consolability: no need to console PAINAD Score: 0    Home Living                          Prior Function            PT Goals (current goals can now be found in the care plan section) Acute Rehab PT Goals Patient Stated Goal: "I want to go home and not be bothered any more" PT Goal Formulation: Patient unable to participate in goal setting Time For Goal Achievement: 05/14/22 Potential to Achieve Goals: Fair Progress towards PT goals: Progressing toward goals    Frequency    Min 2X/week      PT Plan Current plan remains appropriate    Co-evaluation              AM-PAC PT "6 Clicks" Mobility   Outcome Measure  Help needed turning from your back to your side while in a flat bed without using bedrails?: A Little Help needed moving from lying on your back to sitting on the side of a flat bed without using bedrails?: A Little Help needed moving to and from a bed to a chair (including a wheelchair)?: A Lot Help needed standing up from a chair using your arms (e.g., wheelchair or bedside chair)?: A Lot Help needed to walk in hospital room?: A Lot Help needed  climbing 3-5 steps with a railing? : Total 6 Click Score: 13    End of Session Equipment Utilized During Treatment: Gait belt Activity Tolerance: Patient tolerated treatment well;No increased pain Patient left: with call bell/phone within reach;in bed;with bed alarm set Nurse Communication: Mobility status PT Visit Diagnosis: Unsteadiness on feet (R26.81);Muscle weakness (generalized) (M62.81);History of falling (Z91.81);Difficulty in walking, not elsewhere classified (R26.2)     Time: [id number] PT Time Calculation (min) (ACUTE ONLY): 28 min  Charges:  $Therapeutic Activity: 23-37 mins                     Coolidge Breeze, PT, DPT [facility] Rehabilitation Department Office: [phone] Pager: [phone]   Coolidge Breeze 05/02/2022, 5:02 PM

## 2022-05-02 NOTE — TOC Transition Note (Signed)
Transition of Care [facility]) - CM/SW Discharge Note   Patient Details  Name: Keith Pittman MRN: [id number] Date of Birth: Aug 30, 1932  [facility] CM/SW Contact:  Vassie Moselle, LCSW Phone Number: 05/02/2022, 11:47 AM   Clinical Narrative:    Pt is medically clear to discharge to SNF placement. CSW spoke with pt's sister/HCPOA [name] [phone]) who accepted bed offer for SNF placement at [facility]. Pt will be going to room 124 AA and call to report at [phone]. PTAR called at 1200. No further TOC needs identified.    Final next level of care: [place] Barriers to Discharge: Barriers Resolved   Patient Goals and CMS Choice Patient states their goals for this hospitalization and ongoing recovery are:: To go home   Choice offered to / list presented to : [place] / [place]  Discharge Placement   Existing PASRR number confirmed : 05/02/22          Patient [facility] bed at: [facility] Patient to be transferred to facility by: [place] Name of family member notified: [name] ([phone]) Patient and family notified of of transfer: 05/02/22  Discharge Plan and Services In-house Referral: Clinical Social Work Discharge Planning Services: CM Consult Post Acute Care Choice: [place]          DME Arranged: N/A                    Social Determinants of Health ([place]) Interventions     Readmission Risk Interventions    05/02/2022   11:45 AM 09/30/2019    3:55 PM  Readmission Risk Prevention Plan  Post Dischage Appt  Not Complete  Appt Comments  plan for SNF  Medication Screening  Complete  Transportation Screening Complete Complete  PCP or Specialist Appt within 5-7 Days Complete   Home Care Screening Complete   Medication Review (RN CM) Complete

## 2022-07-31 ENCOUNTER — Non-Acute Institutional Stay: Payer: Medicare Other

## 2022-07-31 VITALS — BP 108/76 | HR 72 | Temp 97.3°F

## 2022-07-31 DIAGNOSIS — Z515 Encounter for palliative care: Secondary | ICD-10-CM

## 2022-07-31 NOTE — Progress Notes (Signed)
PATIENT NAME: Keith Pittman DOB: 10/16/1932 MRN: [id number]  PRIMARY CARE PROVIDER: Lavone Orn, MD  RESPONSIBLE PARTY:  Acct ID - Guarantor Home Phone Work Phone Relationship Acct Type  [id number] - [place],ERV* [phone]  Self P/F     [place], [place], [place] [address]   Dementia:  Patient found in the bed with eyes closed.  Awakens easily to verbal stimulation.  Limited interaction with patient.  Staff report patient is often resistant to care.  He is ambulatory with a walker and is able to toilet with assistance.  Patient is able to feed himself but appetite varies.  Patient denies any issues with pain and repeatedly states he is fine.    HISTORY OF PRESENT ILLNESS:  88 year old male with Dementia, history of subdural hematoma, Lower UTI, Hypothyroidism, and Failure to Thrive Adult.  Patient is followed by Palliative Care every 4-8 weeks and PRN.   CODE STATUS: Full ADVANCED DIRECTIVES: No MOST FORM: No PPS: 40%   PHYSICAL EXAM:   VITALS: Today's Vitals   07/31/22 1115  BP: 108/76  Pulse: 72  Temp: (!) 97.3 F (36.3 C)  SpO2: 95%    LUNGS: clear to auscultation  CARDIAC: Cor RRR}  EXTREMITIES: - for edema SKIN: Skin color, texture, turgor normal. No rashes or lesions or poor skin turgor.   NEURO: positive for memory problems       Lorenza Burton, RN

## 2022-09-25 DEATH — deceased
